# Patient Record
Sex: Female | Born: 1950
Health system: Southern US, Community
[De-identification: ages and names within clinical notes are randomized; demographics above are authoritative.]

## PROBLEM LIST (undated history)

## (undated) DIAGNOSIS — G4700 Insomnia, unspecified: Secondary | ICD-10-CM

## (undated) DIAGNOSIS — R5381 Other malaise: Secondary | ICD-10-CM

## (undated) DIAGNOSIS — E785 Hyperlipidemia, unspecified: Secondary | ICD-10-CM

## (undated) DIAGNOSIS — R5383 Other fatigue: Secondary | ICD-10-CM

## (undated) DIAGNOSIS — IMO0002 Reserved for concepts with insufficient information to code with codable children: Secondary | ICD-10-CM

## (undated) DIAGNOSIS — I471 Supraventricular tachycardia, unspecified: Secondary | ICD-10-CM

## (undated) DIAGNOSIS — F419 Anxiety disorder, unspecified: Secondary | ICD-10-CM

## (undated) DIAGNOSIS — K224 Dyskinesia of esophagus: Secondary | ICD-10-CM

## (undated) DIAGNOSIS — K219 Gastro-esophageal reflux disease without esophagitis: Secondary | ICD-10-CM

## (undated) DIAGNOSIS — D509 Iron deficiency anemia, unspecified: Secondary | ICD-10-CM

## (undated) DIAGNOSIS — Z8719 Personal history of other diseases of the digestive system: Secondary | ICD-10-CM

## (undated) DIAGNOSIS — IMO0001 Reserved for inherently not codable concepts without codable children: Secondary | ICD-10-CM

## (undated) DIAGNOSIS — C801 Malignant (primary) neoplasm, unspecified: Secondary | ICD-10-CM

## (undated) HISTORY — DX: Anxiety disorder, unspecified: F41.9

## (undated) HISTORY — DX: Other fatigue: R53.83

## (undated) HISTORY — DX: Supraventricular tachycardia: I47.1

## (undated) HISTORY — DX: Insomnia, unspecified: G47.00

## (undated) HISTORY — DX: Iron deficiency anemia, unspecified: D50.9

## (undated) HISTORY — DX: Reserved for inherently not codable concepts without codable children: IMO0001

## (undated) HISTORY — DX: Gastro-esophageal reflux disease without esophagitis: K21.9

## (undated) HISTORY — DX: Reserved for concepts with insufficient information to code with codable children: IMO0002

## (undated) HISTORY — DX: Dyskinesia of esophagus: K22.4

## (undated) HISTORY — DX: Other malaise: R53.81

## (undated) HISTORY — DX: Hyperlipidemia, unspecified: E78.5

## (undated) HISTORY — DX: Supraventricular tachycardia, unspecified: I47.10

## (undated) HISTORY — PX: CARPAL TUNNEL RELEASE: SHX101

---

## 1997-12-08 HISTORY — PX: CHOLECYSTECTOMY: SHX55

## 1998-05-19 ENCOUNTER — Emergency Department (HOSPITAL_COMMUNITY): Admission: EM | Admit: 1998-05-19 | Discharge: 1998-05-19 | Payer: Self-pay | Admitting: Emergency Medicine

## 1998-05-20 ENCOUNTER — Inpatient Hospital Stay (HOSPITAL_COMMUNITY): Admission: EM | Admit: 1998-05-20 | Discharge: 1998-05-24 | Payer: Self-pay | Admitting: Emergency Medicine

## 1998-05-20 ENCOUNTER — Ambulatory Visit (HOSPITAL_COMMUNITY): Admission: RE | Admit: 1998-05-20 | Discharge: 1998-05-20 | Payer: Self-pay | Admitting: Emergency Medicine

## 1999-12-12 ENCOUNTER — Ambulatory Visit (HOSPITAL_COMMUNITY): Admission: RE | Admit: 1999-12-12 | Discharge: 1999-12-12 | Payer: Self-pay | Admitting: Gastroenterology

## 1999-12-12 ENCOUNTER — Encounter: Payer: Self-pay | Admitting: Gastroenterology

## 2006-05-18 ENCOUNTER — Emergency Department (HOSPITAL_COMMUNITY): Admission: EM | Admit: 2006-05-18 | Discharge: 2006-05-18 | Payer: Self-pay | Admitting: Emergency Medicine

## 2006-06-02 ENCOUNTER — Ambulatory Visit: Payer: Self-pay | Admitting: Cardiovascular Disease

## 2006-06-22 ENCOUNTER — Ambulatory Visit: Payer: Self-pay

## 2006-06-22 ENCOUNTER — Encounter: Payer: Self-pay | Admitting: Cardiology

## 2006-06-22 ENCOUNTER — Ambulatory Visit: Payer: Self-pay | Admitting: Cardiovascular Disease

## 2009-03-21 ENCOUNTER — Emergency Department (HOSPITAL_COMMUNITY): Admission: EM | Admit: 2009-03-21 | Discharge: 2009-03-21 | Payer: Self-pay | Admitting: Emergency Medicine

## 2009-03-28 DIAGNOSIS — I498 Other specified cardiac arrhythmias: Secondary | ICD-10-CM | POA: Insufficient documentation

## 2009-03-28 DIAGNOSIS — E7800 Pure hypercholesterolemia, unspecified: Secondary | ICD-10-CM | POA: Insufficient documentation

## 2009-03-28 DIAGNOSIS — F411 Generalized anxiety disorder: Secondary | ICD-10-CM | POA: Insufficient documentation

## 2009-03-28 DIAGNOSIS — D649 Anemia, unspecified: Secondary | ICD-10-CM | POA: Insufficient documentation

## 2009-04-02 ENCOUNTER — Encounter: Payer: Self-pay | Admitting: Cardiovascular Disease

## 2009-04-02 ENCOUNTER — Ambulatory Visit: Payer: Self-pay | Admitting: Cardiovascular Disease

## 2009-05-08 ENCOUNTER — Encounter: Payer: Self-pay | Admitting: Internal Medicine

## 2009-05-08 ENCOUNTER — Ambulatory Visit: Payer: Self-pay | Admitting: Cardiovascular Disease

## 2009-05-09 ENCOUNTER — Telehealth: Payer: Self-pay | Admitting: Internal Medicine

## 2009-05-28 ENCOUNTER — Encounter: Admission: RE | Admit: 2009-05-28 | Discharge: 2009-05-28 | Payer: Self-pay | Admitting: Internal Medicine

## 2009-09-20 ENCOUNTER — Encounter (INDEPENDENT_AMBULATORY_CARE_PROVIDER_SITE_OTHER): Payer: Self-pay | Admitting: *Deleted

## 2011-01-09 NOTE — Letter (Signed)
Summary: Appointment - Reminder 2  [facility], Main Office  [address]   [place], [place] [address]   Phone: [phone]  Fax: [phone]     September 20, 2009 MRN: [id number]   [facility] [place] [address] [place], [place]  [address]   Dear Ms. Speakman,  Our records indicate that it is time to schedule a follow-up appointment with Dr. Graciela Husbands. It is very important that we reach you to schedule this appointment. We look forward to participating in your health care needs.   Please contact us at the number listed above at your earliest convenience to schedule your appointment.  If you are unable to make an appointment at this time, give [place] a call so we can update our records.  Sincerely,   Burnard Leigh [facility] Scheduling Team

## 2011-01-09 NOTE — Assessment & Plan Note (Signed)
Summary: nep/dx:SVT/lg   Primary Provider:  Johnella Arellano  CC:  SOB, palpitations, and racing feeling.  Dizziness.pt would like her Rx refilled for beta blocker.  History of Present Illness: Sheri Arellano has a three-year history of abrupt onset onset tachypalpitations  these occurred one to 3 times per year but more recently has occurred twice in the last 2 months. On the former occasion she ended up having to call EMS because of severity of symptoms. She was given some medication, presumably a [place], with termination.  She comments that the fire department was unable to record her blood pressure on arrival. At discharge from the emergency room she is given a prescription for metoprolol succinate 25 mg which he took daily until it ran out. This was coincidentally or not followed by another episode of tachycardia.  Symptoms with her episodes include weakness presyncope dyspnea and they are  "frog positive."  she uses some caffeine and admits to a great deal of psychosocial stress. She wonders whether this may be having any impact.   Current Medications (verified): 1)  Lorazepam 1 Mg Tabs (Lorazepam) .... As Needed 2)  Protonix 40 Mg Pack (Pantoprazole Sodium) .... Take 1 Tablet Once Daily 3)  Zoloft 100 Mg Tabs (Sertraline Hcl) .Marland Kitchen.. 1 Tab By Mouth Once Daily 4)  Zocor 80 Mg Tabs (Simvastatin) .... 1/2 Tab By Mouth Once Daily 5)  Femhrt .Marland Kitchen.. 1 Tab By Mouth Once Daily 6)  Calcium .Marland Kitchen.. 1 Tab By Mouth Once Daily 7)  Metolazone 10 Mg Tabs (Metolazone) .... Take One Tablet By Mouth Daily.-Patient Ran Out and Is Taking Expired Propranolol  Allergies (verified): No Known Drug Allergies  Past History:  Past medical, surgical, family and social histories (including risk factors) reviewed, and no changes noted (except as noted below). Past medical history reviewed for relevance to current acute and chronic problems.  Past Medical History: Reviewed history from 03/28/2009 and no changes  required. Current Problems:  HYPERCHOLESTEROLEMIA (ICD-272.0) ANXIETY (ICD-300.00) ANEMIA (ICD-285.9) SUPRAVENTRICULAR TACHYCARDIA (ICD-427.89) Episode in 2007 and 03/2009 requiring ER visit and adenosine  Family History: Reviewed history from 03/28/2009 and no changes required. noncontributory  Social History: Reviewed history from 04/02/2009 and no changes required. 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         She complains of rosacea, esophogeal spasm with reflux and globus.  She wears glasses  Vital Signs:  Patient profile:   60 year old female Height:      62 inches Weight:      167 pounds BMI:     30.66 Pulse rate:   67 / minute Pulse rhythm:   regular BP sitting:   110 / 70  (left arm) Cuff size:   regular  Vitals Entered By: Judithe Modest CMA (May 08, 2009 10:43 AM)  Physical Exam  General:  Alert and oriented in no acute distress. HEENT exam no xanthelasma and normocephalic. Neck veins were flat; carotids brisk and full without bruits. No lymphadenopathy. Back without kyphosis. Lungs clear. Heart sounds regular without murmurs or gallops. PMI nondisplaced. Abdomen soft with active bowel sounds without midline pulsation or hepatomegaly. Femoral pulses and distal pulses intact. Extremities were without clubbing cyanosis or edemaSkin warm and dry. Neurological exam grossly normal  Impression & Recommendations:  Problem # 1:  SUPRAVENTRICULAR TACHYCARDIA (ICD-427.89) Tpatient has recurrent tachypalpitations with electrocardiographic evidence of supraventricular tachycardia. This likely represents AV node reentry given her accompanying symptoms.  We discussed treatment options  including doing nothing, p.r.n. or daily beta blockers, antiarrhythmic drug therapy with its potential for poor arrhythmia, and EP testing with catheter ablation. We discussed the potential benefits as well as the potential risks including but not limited to death perforation heart block requiring pacemaker implantation and recurrence.  At this point she would like to continue on a beta blocker regime. We have refilled for her her Toprol 25 mg. She will take initially as needed in the event that she has another short interval recurrence she'll begin taking it daily.  We will anticipate. catheter ablation in  the event that the frequency become sufficiently problematic. Her updated medication list for this problem includes:    Metoprolol Succinate 25 Mg Xr24h-tab (Metoprolol succinate) .Marland Kitchen... Take one tablet by mouth daily as needed  Her updated medication list for this problem includes:    Metoprolol Succinate 25 Mg Xr24h-tab (Metoprolol succinate) .Marland Kitchen... Take one tablet by mouth daily  Problem # 2:  ANXIETY (ICD-300.00) She currently takes Zoloft. She asked me whether this was the best SSRI for anxiety. I told her I didn't know. I did tell her that we would be glad to try and find a contact for her  in the event that she would like to pursue anxiety related intervention.  Patient Instructions: 1)  Your physician has recommended you make the following change in your medication: add metoprolol succ er 25mg  daily- this has been sent to [facility] on [address]. 2)  Your physician recommends that you schedule a follow-up appointment in: 4 months.

## 2011-01-09 NOTE — Assessment & Plan Note (Signed)
Summary: [name]   CC:  sob with exertion  also pt states feels like some thing is crawling up her throat.  History of Present Illness: Sheri Arellano seen today at the request of [facility].  She was recently seen for recurrent PSVT.  He had a rhythm strip from April 14.  She was in a narrow complex tachycardia at a rate of about 170.  It broke with adenosine.  He said previous episode back in 2007 for which I saw her.  She's only had these 2 episodes that have required treatment.  Back and July of 07 she had a normal echocardiogram.  No history of structural heart disease.  Does get chest pressure at the time of PSVT.  This is likely related to her heart rate.  The pressure goes away when the PSVT breaks.  She was placed on a beta blocker I believe it was Toprol 25 b.i.d.  She tolerates this time.  She does not have a lot of stimulants.  He has minimal caffeine he drinks 2 glasses of vodka a week.  Her thyroid was normal.  She does have some stress.  Her husband had to close [employer] service station.  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  There is no history of coronary artery disease cardiomyopathy PND or orthopnea.  She works out at [employer] 3 days a week doing a lot of treadmill work.  No history of syncope long QT syndrome or arrhythmias outside of narrow complex tachycardia.  Current Medications (verified): 1)  Lorazepam 1 Mg Tabs (Lorazepam) .... As Needed 2)  Iron .Marland Kitchen.. 1 Tab  By Mouth Every Ther Day 3)  Protonix 40mg  .... 1 Tab By Mouth Once Daily 4)  Zoloft 100 Mg Tabs (Sertraline Hcl) .Marland Kitchen.. 1 Tab By Mouth Once Daily 5)  Zocor 80 Mg Tabs (Simvastatin) .... 1/2 Tab By Mouth Once Daily 6)  Femhrt .Marland Kitchen.. 1 Tab By Mouth Once Daily 7)  Calcium .Marland Kitchen.. 1 Tab By Mouth Once Daily 8)  Metolazone 10 Mg Tabs (Metolazone) .... Take One Tablet By Mouth Daily.  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   Vital Signs:  Patient profile:   60 year old female Height:      62 inches Weight:      163 pounds Pulse rate:   70 / minute Resp:     12 per minute BP sitting:   100 / 70  (left arm)  Vitals Entered By: Kem Parkinson (April 02, 2009 10:43 AM)  Physical Exam  General:  Affect appropriate Healthy:  appears stated age HEENT: normal Neck supple with no adenopathy JVP normal no bruits no thyromegaly Lungs clear with no wheezing and good diaphragmatic  motion Heart:  S1/S2 no murmur,rub, gallop or click PMI normal Abdomen: benighn, BS positve, no tenderness, no AAA no bruit.  No HSM or HJR Distal pulses intact with no bruits No edema Neuro non-focal Skin warm and dry    Impression & Recommendations:  Problem # 1:  SUPRAVENTRICULAR TACHYCARDIA (ICD-427.89) Continue BB until seen by EPS consider ablation but likely conservative therapy for now The following medications were removed from the medication list:    Aspirin 81 Mg Tabs (Aspirin)  Orders: Misc. Referral (Misc. Ref)  Problem # 2:  HYPERCHOLESTEROLEMIA (ICD-272.0) Needs F/U lipid and liver with primary this month Her updated medication list for this problem includes:    Zocor 80 Mg Tabs (Simvastatin) .Marland Kitchen... 1/2 tab by mouth once daily  Problem # 3:  ANXIETY (ICD-300.00) Continue zoloft while job issues  are difficult  Patient Instructions: 1)  EPS referral 2)  Continue BB til then

## 2011-03-19 LAB — URINALYSIS, ROUTINE W REFLEX MICROSCOPIC
Bilirubin Urine: NEGATIVE
Glucose, UA: NEGATIVE mg/dL
Hgb urine dipstick: NEGATIVE
Ketones, ur: NEGATIVE mg/dL
Nitrite: NEGATIVE
Protein, ur: NEGATIVE mg/dL
Specific Gravity, Urine: 1.011 (ref 1.005–1.030)
Urobilinogen, UA: 0.2 mg/dL (ref 0.0–1.0)
pH: 7.5 (ref 5.0–8.0)

## 2011-03-19 LAB — CBC
HCT: 43.1 % (ref 36.0–46.0)
Hemoglobin: 15.1 g/dL — ABNORMAL HIGH (ref 12.0–15.0)
MCHC: 35 g/dL (ref 30.0–36.0)
MCV: 94.1 fL (ref 78.0–100.0)
Platelets: 250 10*3/uL (ref 150–400)
RBC: 4.58 MIL/uL (ref 3.87–5.11)
RDW: 13.3 % (ref 11.5–15.5)
WBC: 6.5 10*3/uL (ref 4.0–10.5)

## 2011-03-19 LAB — DIFFERENTIAL
Basophils Absolute: 0 10*3/uL (ref 0.0–0.1)
Basophils Relative: 0 % (ref 0–1)
Eosinophils Absolute: 0 10*3/uL (ref 0.0–0.7)
Eosinophils Relative: 1 % (ref 0–5)
Lymphocytes Relative: 25 % (ref 12–46)
Lymphs Abs: 1.6 10*3/uL (ref 0.7–4.0)
Monocytes Absolute: 0.4 10*3/uL (ref 0.1–1.0)
Monocytes Relative: 6 % (ref 3–12)
Neutro Abs: 4.4 10*3/uL (ref 1.7–7.7)
Neutrophils Relative %: 67 % (ref 43–77)

## 2011-03-19 LAB — CK TOTAL AND CKMB (NOT AT ARMC)
CK, MB: 0.9 ng/mL (ref 0.3–4.0)
Relative Index: INVALID (ref 0.0–2.5)
Total CK: 50 U/L (ref 7–177)

## 2011-03-19 LAB — BASIC METABOLIC PANEL
BUN: 9 mg/dL (ref 6–23)
CO2: 27 mEq/L (ref 19–32)
Calcium: 8.8 mg/dL (ref 8.4–10.5)
Chloride: 104 mEq/L (ref 96–112)
Creatinine, Ser: 0.75 mg/dL (ref 0.4–1.2)
GFR calc Af Amer: >60 mL/min (ref >60)
GFR calc non Af Amer: >60 mL/min (ref >60)
Glucose, Bld: 110 mg/dL — ABNORMAL HIGH (ref 70–99)
Potassium: 3.3 mEq/L — ABNORMAL LOW (ref 3.5–5.1)
Sodium: 137 mEq/L (ref 135–145)

## 2011-03-19 LAB — POCT CARDIAC MARKERS
CKMB, poc: <1 ng/mL — ABNORMAL LOW (ref 1.0–8.0)
Myoglobin, poc: 47.1 ng/mL (ref 12–200)
Troponin i, poc: <0.05 ng/mL (ref 0.00–0.09)

## 2011-03-19 LAB — TROPONIN I: Troponin I: 0.01 ng/mL (ref 0.00–0.06)

## 2012-01-06 ENCOUNTER — Other Ambulatory Visit (HOSPITAL_COMMUNITY)
Admission: RE | Admit: 2012-01-06 | Discharge: 2012-01-06 | Disposition: A | Discharge status: Home / Self Care | Payer: BC Managed Care – PPO | Source: Ambulatory Visit | Attending: Internal Medicine | Admitting: Internal Medicine

## 2012-01-06 DIAGNOSIS — Z1159 Encounter for screening for other viral diseases: Secondary | ICD-10-CM | POA: Insufficient documentation

## 2012-01-06 DIAGNOSIS — Z01419 Encounter for gynecological examination (general) (routine) without abnormal findings: Secondary | ICD-10-CM | POA: Insufficient documentation

## 2012-01-15 ENCOUNTER — Encounter (INDEPENDENT_AMBULATORY_CARE_PROVIDER_SITE_OTHER): Payer: Self-pay | Admitting: Surgery

## 2012-01-21 ENCOUNTER — Encounter (INDEPENDENT_AMBULATORY_CARE_PROVIDER_SITE_OTHER): Payer: Self-pay | Admitting: Surgery

## 2012-02-16 ENCOUNTER — Encounter (INDEPENDENT_AMBULATORY_CARE_PROVIDER_SITE_OTHER): Payer: Self-pay | Admitting: Surgery

## 2012-02-16 ENCOUNTER — Ambulatory Visit (INDEPENDENT_AMBULATORY_CARE_PROVIDER_SITE_OTHER): Payer: BC Managed Care – PPO | Admitting: Surgery

## 2012-02-16 VITALS — BP 122/70 | HR 72 | Temp 98.2°F | Ht 62.5 in | Wt 152.2 lb

## 2012-02-16 DIAGNOSIS — K623 Rectal prolapse: Secondary | ICD-10-CM | POA: Insufficient documentation

## 2012-02-16 NOTE — Patient Instructions (Signed)
Prolapse  Prolapse means the falling down, bulging, dropping, or drooping of a body part. Organs that commonly prolapse include the rectum, small intestine, bladder, urethra, vagina (birth canal), uterus (womb), and cervix. Prolapse occurs when the ligaments and muscle tissue around the rectum, bladder, and uterus are damaged or weakened.  CAUSES  This happens especially with:  Childbirth. Some women feel pelvic pressure or have trouble holding their urine right after childbirth, because of stretching and tearing of pelvic tissues. This generally gets better with time and the feeling usually goes away, but it may return with aging.   Chronic heavy lifting.   Aging.   Menopause, with loss of estrogen production weakening the pelvic ligaments and muscles.   Past pelvic surgery.   Obesity.   Chronic constipation.   Chronic cough.  Prolapse may affect a single organ, or several organs may prolapse at the same time. The front wall of the vagina holds up the bladder. The back wall holds up part of the lower intestine, or rectum. The uterus fills a spot in the middle. All these organs can be involved when the ligaments and muscles around the vagina relax too much. This often gets worse when women stop producing estrogen (menopause). SYMPTOMS  Uncontrolled loss of urine (incontinence) with cough, sneeze, straining, and exercise.   More force may be required to have a bowel movement, due to trapping of the stool.   When part of an organ bulges through the opening of the vagina, there is sometimes a feeling of heaviness or pressure. It may feel as though something is falling out. This sensation increases with coughing or bearing down.   If the organs protrude through the opening of the vagina and rub against the clothing, there may be soreness, ulcers, infection, pain, and bleeding.   Lower back pain.   Pushing in the upper or lower part of the vagina, to pass urine or have a bowel movement.     Problems having sexual intercourse.   Being unable to insert a tampon or applicator.  DIAGNOSIS  Usually, a physical exam is all that is needed to identify the problem. During the examination, you may be asked to cough and strain while lying down, sitting up, and standing up. Your caregiver will determine if more testing is required, such as bladder function tests. Some diagnoses are:  Cystocele: Bulging and falling of the bladder into the top of the vagina.   Rectocele: Part of the rectum bulging into the vagina.   Prolapse of the uterus: The uterus falls or drops into the vagina.   Enterocele: Bulging of the top of the vagina, after a hysterectomy (uterus removal), with the small intestine bulging into the vagina. A hernia in the top of the vagina.   Urethrocele: The urethra (urine carrying tube) bulging into the vagina.  TREATMENT  In most cases, prolapse needs to be treated only if it produces symptoms. If the symptoms are interfering with your usual daily or sexual activities, treatment may be necessary. The following are some measures that may be used to treat prolapse.  Estrogen may help elderly women with mild prolapse.   Kegel exercises may help mild cases of prolapse, by strengthening and tightening the muscles of the pelvic floor.   Pessaries are used in women who choose not to, or are unable to, have surgery. A pessary is a doughnut-shaped piece of plastic or rubber that is put into the vagina to keep the organs in place. This device must   be fitted by your caregiver. Your caregiver will also explain how to care for yourself with the pessary. If it works well for you, this may be the only treatment required.   Surgery is often the only form of treatment for more severe prolapses. There are different types of surgery available. You should discuss what the best procedure is for you. If the uterus is prolapsed, it may be removed (hysterectomy) as part of the surgical treatment.  Your caregiver will discuss the risks and benefits with you.   Uterine-vaginal suspension (surgery to hold up the organs) may be used, especially if you want to maintain your fertility.  No form of treatment is guaranteed to correct the prolapse or relieve the symptoms. HOME CARE INSTRUCTIONS   Wear a sanitary pad or absorbent product if you have incontinence of urine.   Avoid heavy lifting and straining with exercise and work.   Take over-the-counter pain medicine for minor discomfort.   Try taking estrogen or using estrogen vaginal cream.   Try Kegel exercises or use a pessary, before deciding to have surgery.   Do Kegel exercises after having a baby.  SEEK MEDICAL CARE IF:   Your symptoms interfere with your daily activities.   You need medicine to help with the discomfort.   You need to be fitted with a pessary.   You notice bleeding from the vagina.   You think you have ulcers or you notice ulcers on the cervix.   You have an oral temperature above 102 F (38.9 C).   You develop pain or blood with urination.   You have bleeding with a bowel movement.   The symptoms are interfering with your sex life.   You have urinary incontinence that interferes with your daily activities.   You lose urine with sexual intercourse.   You have a chronic cough.   You have chronic constipation.  Document Released: 05/31/2003 Document Revised: 11/13/2011 Document Reviewed: 12/09/2009 Exit[employer] Patient Information 2012 [employer]. 

## 2012-02-16 NOTE — Progress Notes (Signed)
Chief Complaint  Patient presents with  . Rectal Problems    eval of skin tags - referral from Dr. Robley Fries, [place] [facility]    HISTORY: Patient is a pleasant 61 year old white female referred by her primary care physician for evaluation of anal skin tags. Patient states that these have been present since the birth of her 2 children. She denies any complaints related to hemorrhoid disease. She has had no pain. She denies any rectal bleeding. She has had no prior rectal surgery. Patient notes difficulty with cleaning after bowel movements. She notes soilage one to 2 hours following bowel movement. She denies any pain.  Past Medical History  Diagnosis Date  . Esophageal dysmotility   . Anxiety     probable panic attacks  . Insomnia   . Iron deficiency anemia   . GERD (gastroesophageal reflux disease)   . Hyperlipidemia   . Palpitations   . Dysphagia   . Vitamin d deficiency   . Tachycardia, unspecified   . Other malaise and fatigue      Current Outpatient Prescriptions  Medication Sig Dispense Refill  . clonazePAM (KLONOPIN) 1 MG tablet Take 1 mg by mouth 2 (two) times daily as needed.      Marland Kitchen estradiol (ESTRACE) 1 MG tablet Take 1 mg by mouth daily.      . Fiber-Vitamins-Minerals (FIBERALL PO) Take by mouth.      . metoprolol tartrate (LOPRESSOR) 25 MG tablet Take 25 mg by mouth 2 (two) times daily.      . nitroGLYCERIN (NITROSTAT) 0.4 MG SL tablet Place 0.4 mg under the tongue every 5 (five) minutes as needed.      . pantoprazole (PROTONIX) 40 MG tablet Take 40 mg by mouth daily.      . sertraline (ZOLOFT) 100 MG tablet Take 100 mg by mouth daily.      . simvastatin (ZOCOR) 40 MG tablet Take 40 mg by mouth every evening.         Allergies  Allergen Reactions  . Erythromycin Base      Family History  Problem Relation Age of Onset  . Atrial fibrillation Mother   . Hyperlipidemia Mother   . Hypertension Mother   . Cancer Father     bladder  . Cancer Maternal  Aunt     breast     History   Social History  . Marital Status: Married    Spouse Name: N/A    Number of Children: N/A  . Years of Education: N/A   Social History Main Topics  . Smoking status: Former Games developer  . Smokeless tobacco: Former Neurosurgeon    Quit date: 02/16/1975  . Alcohol Use: 0.6 oz/week    1 Glasses of wine per week  . Drug Use: No  . Sexually Active:    Other Topics Concern  . None   Social History Narrative  . None     REVIEW OF SYSTEMS - PERTINENT POSITIVES ONLY: No symptoms of internal or external hemorrhoids. Small fecal incontinence. Anal skin tags.  EXAM: Filed Vitals:   02/16/12 1329  BP: 122/70  Pulse: 72  Temp: 98.2 F (36.8 C)    HEENT: normocephalic; pupils equal and reactive; sclerae clear; dentition good; mucous membranes moist NECK:  symmetric on extension; no palpable anterior or posterior cervical lymphadenopathy; no supraclavicular masses; no tenderness CHEST: clear to auscultation bilaterally without rales, rhonchi, or wheezes CARDIAC: regular rate and rhythm without significant murmur; peripheral pulses are full GU:  External  exam shows circumferential skin tags. These were broad-based. On closer inspection there is actually prolapse of the rectal mucosa through the anal orifice resulting in what appears to be significant skin tags circumferentially. With slight eversion the rectal mucosa is visible above the level of the dentate line. Digital rectal exam shows relatively normal tone. No palpable masses. EXT:  non-tender without edema; no deformity NEURO: no gross focal deficits; no sign of tremor   LABORATORY RESULTS: See [facility] (CHL-Epic) for most recent results   RADIOLOGY RESULTS: See [facility] (CHL-Epic) for most recent results   IMPRESSION: #1 mild rectal prolapse #2 anal skin tags  PLAN: I discussed these findings at length with the patient. I do not think that simply excising anal skin tags will provide  her with the results which she needs. I believe she has an element of rectal prolapse contributing to her episodes of fecal incontinence and drainage. I have discussed her case with my partner, Dr. Claud Kelp, who has agreed to see her in consultation.  I believe she may be a candidate for the [facility] procedure. We will make arrangements for this evaluation in the near future.  Velora Heckler, MD, FACS General & Endocrine Surgery [facility]   Visit Diagnoses: 1. Rectal mucosa prolapse, anal skin tags     Primary Care Physician: Pearla Dubonnet, MD, MD

## 2012-03-15 ENCOUNTER — Ambulatory Visit (INDEPENDENT_AMBULATORY_CARE_PROVIDER_SITE_OTHER): Payer: BC Managed Care – PPO | Admitting: General Surgery

## 2012-03-15 ENCOUNTER — Encounter (INDEPENDENT_AMBULATORY_CARE_PROVIDER_SITE_OTHER): Payer: Self-pay | Admitting: General Surgery

## 2012-03-15 VITALS — BP 106/72 | HR 68 | Temp 99.8°F | Resp 12 | Ht 62.5 in | Wt 150.6 lb

## 2012-03-15 DIAGNOSIS — K644 Residual hemorrhoidal skin tags: Secondary | ICD-10-CM

## 2012-03-15 DIAGNOSIS — K648 Other hemorrhoids: Secondary | ICD-10-CM

## 2012-03-15 NOTE — Progress Notes (Signed)
Patient ID: Sheri Arellano, female   DOB: 28-Nov-1951, 61 y.o.   MRN: [id number]  Chief Complaint  Patient presents with  . Other    new pt- eval for possible PPH    HPI Sheri Arellano is a 61 y.o. female.  This patient was referred to me by Dr. Darnell Level because of symptomatic hemorrhoids. Her primary care physician is Dr. Jonah Blue.  The patient's history is one of hemorrhoids for 30 years. Her hemorrhoids are extremely large externally. She rarely has bleeding. She has no pain. They are so large that she cannot get them clean and it is  very difficult to maintain skin hygiene. She has thought about having something done for some time and is now ready to do that.  She otherwise is healthy. She occasionally has palpitations and takes Lopressor if that happens but this is rare. She is a little bit of reflux and takes proton next. She takes Zoloft, Clonopin her to help her sleep and she takes Zocor for hyperlipidemia. She's had no major medical problems. HPI  Past Medical History  Diagnosis Date  . Esophageal dysmotility   . Anxiety     probable panic attacks  . Insomnia   . Iron deficiency anemia   . GERD (gastroesophageal reflux disease)   . Hyperlipidemia   . Palpitations   . Dysphagia   . Vitamin d deficiency   . Tachycardia, unspecified   . Other malaise and fatigue     Past Surgical History  Procedure Date  . Carpal tunnel release   . Cholecystectomy 1999    Family History  Problem Relation Age of Onset  . Atrial fibrillation Mother   . Hyperlipidemia Mother   . Hypertension Mother   . Cancer Father     bladder  . Cancer Maternal Aunt     breast    Social History History  Substance Use Topics  . Smoking status: Former Games developer  . Smokeless tobacco: Former Neurosurgeon    Quit date: 02/16/1975  . Alcohol Use: 0.6 oz/week    1 Glasses of wine per week    Allergies  Allergen Reactions  . Erythromycin Base     Current Outpatient Prescriptions  Medication  Sig Dispense Refill  . clonazePAM (KLONOPIN) 1 MG tablet Take 1 mg by mouth 2 (two) times daily as needed.      Marland Kitchen estradiol (ESTRACE) 1 MG tablet Take 1 mg by mouth daily.      . Fiber-Vitamins-Minerals (FIBERALL PO) Take by mouth.      . metoprolol tartrate (LOPRESSOR) 25 MG tablet Take 25 mg by mouth 2 (two) times daily.      . nitroGLYCERIN (NITROSTAT) 0.4 MG SL tablet Place 0.4 mg under the tongue every 5 (five) minutes as needed.      . pantoprazole (PROTONIX) 40 MG tablet Take 40 mg by mouth daily.      . sertraline (ZOLOFT) 100 MG tablet Take 100 mg by mouth daily.      . simvastatin (ZOCOR) 40 MG tablet Take 40 mg by mouth every evening.        Review of Systems Review of Systems  Constitutional: Negative for fever, chills and unexpected weight change.  HENT: Negative for hearing loss, congestion, sore throat, trouble swallowing and voice change.   Eyes: Negative for visual disturbance.  Respiratory: Negative for cough and wheezing.   Cardiovascular: Positive for palpitations. Negative for chest pain and leg swelling.  Gastrointestinal: Negative for nausea, vomiting, abdominal  pain, diarrhea, constipation, blood in stool, abdominal distention and anal bleeding.  Genitourinary: Negative for hematuria, vaginal bleeding and difficulty urinating.  Musculoskeletal: Negative for arthralgias.  Skin: Negative for rash and wound.  Neurological: Negative for seizures, syncope and headaches.  Hematological: Negative for adenopathy. Does not bruise/bleed easily.  Psychiatric/Behavioral: Negative for confusion.    Blood pressure 106/72, pulse 68, temperature 99.8 F (37.7 C), temperature source Temporal, resp. rate 12, height 5' 2.5" (1.588 m), weight 150 lb 9.6 oz (68.312 kg).  Physical Exam Physical Exam  Constitutional: She is oriented to person, place, and time. She appears well-developed and well-nourished. No distress.  HENT:  Head: Normocephalic and atraumatic.  Nose: Nose  normal.  Mouth/Throat: No oropharyngeal exudate.  Eyes: Conjunctivae and EOM are normal. Pupils are equal, round, and reactive to light. Left eye exhibits no discharge. No scleral icterus.  Neck: Neck supple. No JVD present. No tracheal deviation present. No thyromegaly present.  Cardiovascular: Normal rate, regular rhythm, normal heart sounds and intact distal pulses.   No murmur heard. Pulmonary/Chest: Effort normal and breath sounds normal. No respiratory distress. She has no wheezes. She has no rales. She exhibits no tenderness.  Abdominal: Soft. Bowel sounds are normal. She exhibits no distension and no mass. There is no tenderness. There is no rebound and no guarding.  Genitourinary:       External hemorrhoids are circumferential and are huge. They are not thrombosed. They are not acutely inflamed. There are also internal hemorrhoids with his some minor prolapse. Anoscopy revealed internal hemorrhoids but they are not that large.  Musculoskeletal: She exhibits no edema and no tenderness.  Lymphadenopathy:    She has no cervical adenopathy.  Neurological: She is alert and oriented to person, place, and time. She exhibits normal muscle tone. Coordination normal.  Skin: Skin is warm. No rash noted. She is not diaphoretic. No erythema. No pallor.  Psychiatric: She has a normal mood and affect. Her behavior is normal. Judgment and thought content normal.    Data Reviewed  I reviewed Dr. Sid Falcon office notes. Assessment    External hemorrhoids with complications of bleeding. These are very large volume hemorrhoids and I do not take that a stapled hemorrhoid a pexy will alleviate her symptoms.  Internal hemorrhoids with prolapse, stage II  Intermittent unspecified tachycardia and palpitations  Hyperlipidemia  Gastroesophageal reflux disease  Esophageal dysmotility    Plan    I had a long talk with this very pleasant patient. I told her that because of the huge volume of her  external hemorrhoidal component, that her symptoms would not be relieved simply by stapled hemorrhoidopexy. In addition, bleeding and prolapse are not her primary complaints.  I advised her to consider surgical internal and external hemorrhoidectomy, all 3 columns. I explained the techniques of the surgery. I explained the postop disability and discomfort and the expectation of ultimate recovery and relief of symptoms with about a 90% chance. I talked about the risks of bleeding, infection, stenosis, sphincter damage with incontinence, residual tags requiring further surgery later, and other unforseen complications. . She understands these issues. Her questions were answered.  She will to go home and think about this. She seemed interested in surgery. She will call me when she is ready.       Angelia Mould. Derrell Lolling, M.D., [facility] General and Minimally invasive Surgery Breast and Colorectal Surgery Office:   [phone] Pager:   [phone]  03/15/2012, 4:24 PM

## 2012-03-15 NOTE — Patient Instructions (Signed)
You have  very large external hemorrhoids and smaller but somewhat prolapsing internal hemorrhoids. This will require a surgical hemorrhoidectomy with sutures to control the problem. I do not think that a stapled hemorrhoidopexy will control your symptoms.  Please call back when you are ready to schedule the surgery.

## 2012-05-21 DIAGNOSIS — Z9109 Other allergy status, other than to drugs and biological substances: Secondary | ICD-10-CM | POA: Insufficient documentation

## 2012-11-17 ENCOUNTER — Encounter (INDEPENDENT_AMBULATORY_CARE_PROVIDER_SITE_OTHER): Payer: BC Managed Care – PPO | Admitting: Cardiovascular Disease

## 2012-11-17 DIAGNOSIS — R0989 Other specified symptoms and signs involving the circulatory and respiratory systems: Secondary | ICD-10-CM

## 2012-11-18 ENCOUNTER — Ambulatory Visit (INDEPENDENT_AMBULATORY_CARE_PROVIDER_SITE_OTHER): Payer: BC Managed Care – PPO | Admitting: Cardiovascular Disease

## 2012-11-18 ENCOUNTER — Encounter: Payer: Self-pay | Admitting: Cardiovascular Disease

## 2012-11-18 VITALS — BP 121/77 | HR 81 | Ht 62.5 in | Wt 155.6 lb

## 2012-11-18 DIAGNOSIS — I498 Other specified cardiac arrhythmias: Secondary | ICD-10-CM

## 2012-11-18 DIAGNOSIS — E7800 Pure hypercholesterolemia, unspecified: Secondary | ICD-10-CM

## 2012-11-18 DIAGNOSIS — R079 Chest pain, unspecified: Secondary | ICD-10-CM

## 2012-11-18 DIAGNOSIS — F411 Generalized anxiety disorder: Secondary | ICD-10-CM

## 2012-11-18 DIAGNOSIS — R002 Palpitations: Secondary | ICD-10-CM

## 2012-11-18 MED ORDER — METOPROLOL TARTRATE 25 MG PO TABS: 25.0000 mg | ORAL_TABLET | Freq: Two times a day (BID) | ORAL | Status: DC | PRN

## 2012-11-18 NOTE — Progress Notes (Signed)
Patient ID: Sheri Arellano, female   DOB: 06-30-1951, 61 y.o.   MRN: [id number] 61 y.o referred by Dr Kevan Ny for palpitatoins, ? Recurrent SVT and chest pain.  She has anxiety.  Seen by Dr Graciela Husbands in 2/12 and did not want ablation.  Has had 3-4 episodes over the last 2 years of SVT.  Initial episode Rx by EMS with adenosine with resolution. Dr Graciela Husbands indicated metroprolol Rx.  Patient had not been on it but had 30 minute episode HR over 200 on 11/26 that broke when EMS arrived.  She took the metoprolol for a few days around [place] Had another episode a week latter wit nausea, diaphoresis and presyncope but not the rapid beating Had SSCP and dyspnea with SVT.  In office today had PVC.  Gets occasional flip flops weekly.   ROS: Denies fever, malais, weight loss, blurry vision, decreased visual acuity, cough, sputum, SOB, hemoptysis, pleuritic pain, palpitaitons, heartburn, abdominal pain, melena, lower extremity edema, claudication, or rash.  All other systems reviewed and negative   General: Affect appropriate Healthy:  appears stated age HEENT: normal Neck supple with no adenopathy JVP normal no bruits no thyromegaly Lungs clear with no wheezing and good diaphragmatic motion Heart:  S1/S2 no murmur,rub, gallop or click PMI normal Abdomen: benighn, BS positve, no tenderness, no AAA no bruit.  No HSM or HJR Distal pulses intact with no bruits No edema Neuro non-focal Skin warm and dry No muscular weakness  Medications Current Outpatient Prescriptions  Medication Sig Dispense Refill  . clonazePAM (KLONOPIN) 1 MG tablet Take 1 mg by mouth 2 (two) times daily as needed.      Marland Kitchen estradiol (ESTRACE) 1 MG tablet Take 1 mg by mouth daily.      . Fiber-Vitamins-Minerals (FIBERALL PO) Take by mouth.      . metoprolol tartrate (LOPRESSOR) 25 MG tablet Take 25 mg by mouth 2 (two) times daily as needed.       . nitroGLYCERIN (NITROSTAT) 0.4 MG SL tablet Place 0.4 mg under the tongue every 5 (five)  minutes as needed.      . pantoprazole (PROTONIX) 40 MG tablet Take 40 mg by mouth daily.      . sertraline (ZOLOFT) 100 MG tablet Take 100 mg by mouth daily.      . simvastatin (ZOCOR) 40 MG tablet Take 40 mg by mouth every evening.        Allergies Erythromycin base  Family History: Family History  Problem Relation Age of Onset  . Atrial fibrillation Mother   . Hyperlipidemia Mother   . Hypertension Mother   . Cancer Father     bladder  . Cancer Maternal Aunt     breast    Social History: History   Social History  . Marital Status: Married    Spouse Name: N/A    Number of Children: N/A  . Years of Education: N/A   Occupational History  . Not on file.   Social History Main Topics  . Smoking status: Former Games developer  . Smokeless tobacco: Former Neurosurgeon    Quit date: 02/16/1975  . Alcohol Use: 0.6 oz/week    1 Glasses of wine per week  . Drug Use: No  . Sexually Active:    Other Topics Concern  . Not on file   Social History Narrative  . No narrative on file    Electrocardiogram:  SR rate 81  PVC  R wave progression improved since 6/10  Assessment and Plan

## 2012-11-18 NOTE — Addendum Note (Signed)
Addended by: Scherrie Bateman E on: 11/18/2012 11:21 AM   Modules accepted: Orders

## 2012-11-18 NOTE — Assessment & Plan Note (Signed)
Continue PRN BZ;s

## 2012-11-18 NOTE — Patient Instructions (Signed)
Your physician recommends that you schedule a follow-up appointment in: WITH DR Graciela Husbands  TO DISCUSS SVT ABLATION Your physician recommends that you continue on your current medications as directed. Please refer to the Current Medication list given to you today.  Your physician has requested that you have a stress echocardiogram. For further information please visit [email or web address] Please follow instruction sheet as given. DX CHEST PAIN

## 2012-11-18 NOTE — Assessment & Plan Note (Signed)
Continue PRN beta blocker Refer to [facility] for reconsideration of ablation for presumed AVNRT.  Since she has had some chest pain and PVC in office will do stress echo to R/O exercise induced arrhythmia and assess EF

## 2012-11-18 NOTE — Assessment & Plan Note (Signed)
Cholesterol is at goal.  Continue current dose of statin and diet Rx.  No myalgias or side effects.  F/U  LFT's in 6 months. No results found for this basename: LDLCALC   Labs with Dr Gates           

## 2012-12-10 ENCOUNTER — Ambulatory Visit (HOSPITAL_COMMUNITY): Payer: BC Managed Care – PPO | Attending: Cardiology | Admitting: Radiology

## 2012-12-10 ENCOUNTER — Ambulatory Visit (HOSPITAL_COMMUNITY): Payer: BC Managed Care – PPO | Attending: Cardiology

## 2012-12-10 ENCOUNTER — Encounter: Payer: Self-pay | Admitting: Cardiovascular Disease

## 2012-12-10 DIAGNOSIS — R079 Chest pain, unspecified: Secondary | ICD-10-CM

## 2012-12-10 DIAGNOSIS — R072 Precordial pain: Secondary | ICD-10-CM | POA: Insufficient documentation

## 2012-12-10 DIAGNOSIS — R0989 Other specified symptoms and signs involving the circulatory and respiratory systems: Secondary | ICD-10-CM

## 2012-12-10 DIAGNOSIS — R002 Palpitations: Secondary | ICD-10-CM | POA: Insufficient documentation

## 2012-12-10 NOTE — Progress Notes (Signed)
Stress Echocardiogram performed.  

## 2012-12-30 ENCOUNTER — Encounter: Payer: BC Managed Care – PPO | Admitting: Internal Medicine

## 2013-01-10 ENCOUNTER — Ambulatory Visit (INDEPENDENT_AMBULATORY_CARE_PROVIDER_SITE_OTHER): Payer: BC Managed Care – PPO | Admitting: Internal Medicine

## 2013-01-10 ENCOUNTER — Encounter: Payer: Self-pay | Admitting: Internal Medicine

## 2013-01-10 VITALS — BP 113/87 | HR 76 | Ht 62.5 in | Wt 155.8 lb

## 2013-01-10 DIAGNOSIS — I498 Other specified cardiac arrhythmias: Secondary | ICD-10-CM

## 2013-01-10 NOTE — Assessment & Plan Note (Signed)
The patient has documented adenosine sensitive SVT dating back into her 36s with symptoms consistent with AV reentry. At this juncture, having discussed beta blockers and catheter ablation she would like to continue with when necessary medications. We reviewed vagal maneuvers. We reviewed her stress tests. We will see her in one year.

## 2013-01-10 NOTE — Patient Instructions (Addendum)
Your physician wants you to follow-up in: 1 year.   You will receive a reminder letter in the mail two months in advance. If you don't receive a letter, please call our office to schedule the follow-up appointment.  We will cancel your appt with Dr Eden Emms

## 2013-01-10 NOTE — Progress Notes (Signed)
ELECTROPHYSIOLOGY CONSULT NOTE  Patient ID: Sheri Arellano, MRN: [id number], DOB/AGE: June 10, 1951 62 y.o. Admit date: (Not on file) Date of Consult: 01/10/2013  Primary Physician: Sheri Dubonnet, MD Primary Cardiologist:  PNishAn Chief Complaint: SVT   HPI Sheri Arellano is a 62 y.o. female  Seen after a hiatus of 3-1/2 years because of SVT with documented tachycardia and symptoms suggestive of AV  reentry . At that time she elected drug therapy with a beta blocker  She has had 3 or 4 episodes over the last couple of years. She had one episode in [place] to [place]  EMS called after her beta blockers did not work; Valsalva maneuvers were successful and she went on her way to [place].  Her symptoms are notable primarily for tachypalpitations and nausea. They are frog negative. They prompt a need to defecate but not to urinate.  Apart from these episodes she has no exercise intolerance. A stress echo was undertaken which was normal.an echocardiogram 2007 demonstrated normal valvular and ventricular function.    Past Medical History  Diagnosis Date  . Esophageal dysmotility   . Anxiety     probable panic attacks  . Insomnia   . Iron deficiency anemia   . GERD (gastroesophageal reflux disease)   . Hyperlipidemia   . SVT (supraventricular tachycardia)     documented  . Dysphagia   . Vitamin D deficiency   . Other malaise and fatigue       Surgical History:  Past Surgical History  Procedure Date  . Carpal tunnel release   . Cholecystectomy 1999     Home Meds: Prior to Admission medications   Medication Sig Start Date End Date Taking? Authorizing Provider  clonazePAM (KLONOPIN) 1 MG tablet Take 1 mg by mouth at bedtime as needed.    Yes Historical Provider, MD  estradiol (ESTRACE) 1 MG tablet Take 1 mg by mouth daily.   Yes Historical Provider, MD  Fiber-Vitamins-Minerals (FIBERALL PO) Take by mouth.   Yes Historical Provider, MD  metoprolol tartrate (LOPRESSOR) 25 MG  tablet Take 1 tablet (25 mg total) by mouth 2 (two) times daily as needed. 11/18/12  Yes Wendall Stade, MD  nitroGLYCERIN (NITROSTAT) 0.4 MG SL tablet Place 0.4 mg under the tongue every 5 (five) minutes as needed.   Yes Historical Provider, MD  pantoprazole (PROTONIX) 40 MG tablet Take 40 mg by mouth daily.   Yes Historical Provider, MD  sertraline (ZOLOFT) 100 MG tablet Take 100 mg by mouth daily. Takes 1/2 daily   Yes Historical Provider, MD  simvastatin (ZOCOR) 40 MG tablet Take 40 mg by mouth every evening. Takes 1/2 daily   Yes Historical Provider, MD      Allergies:  Allergies  Allergen Reactions  . Erythromycin Base     History   Social History  . Marital Status: Married    Spouse Name: N/A    Number of Children: N/A  . Years of Education: N/A   Occupational History  . Not on file.   Social History Main Topics  . Smoking status: Former Games developer  . Smokeless tobacco: Former Neurosurgeon    Quit date: 02/16/1975  . Alcohol Use: 0.6 oz/week    1 Glasses of wine per week  . Drug Use: No  . Sexually Active:    Other Topics Concern  . Not on file   Social History Narrative  . No narrative on file     Family History  Problem Relation  Age of Onset  . Atrial fibrillation Mother   . Hyperlipidemia Mother   . Hypertension Mother   . Cancer Father     bladder  . Cancer Maternal Aunt     breast     ROS:  Please see the history of present illness.   Negative except  esophageal spasm and anxiety  All other systems reviewed and negative.    Physical Exam:he is daughter today Blood pressure 113/87, pulse 76, height 5' 2.5" (1.588 m), weight 155 lb 12.8 oz (70.67 kg). General: Well developed, well nourished female in no acute distress. Head: Normocephalic, atraumatic, sclera non-icteric, no xanthomas, nares are without discharge. Lymph Nodes:  none Back: without scoliosis/kyphosis, no CVA tendersness Neck: Negative for carotid bruits. JVD not elevated. Lungs: Clear  bilaterally to auscultation without wheezes, rales, or rhonchi. Breathing is unlabored. Heart: RRR with S1 S2. No murmur , rubs, or gallops appreciated. Abdomen: Soft, non-tender, non-distended with normoactive bowel sounds. No hepatomegaly. No rebound/guarding. No obvious abdominal masses. Msk:  Strength and tone appear normal for age. Extremities: No clubbing or cyanosis. No edema.  Distal pedal pulses are 2+ and equal bilaterally. Skin: Warm and Dry Neuro: Alert and oriented X 3. CN III-XII intact Grossly normal sensory and motor function . Psych:  Responds to questions appropriately with a normal affect.      Labs: Cardiac Enzymes No results found for this basename: CKTOTAL:4,CKMB:4,TROPONINI:4 in the last 72 hours CBC Lab Results  Component Value Date   WBC 6.5 03/21/2009   HGB 15.1* 03/21/2009   HCT 43.1 03/21/2009   MCV 94.1 03/21/2009   PLT 250 03/21/2009   PROTIME: No results found for this basename: LABPROT:3,INR:3 in the last 72 hours Chemistry No results found for this basename: NA,K,CL,CO2,BUN,CREATININE,CALCIUM,LABALBU,PROT,BILITOT,ALKPHOS,ALT,AST,GLUCOSE in the last 168 hours Lipids No results found for this basename: CHOL,  HDL,  LDLCALC,  TRIG   BNP No results found for this basename: probnp   Miscellaneous No results found for this basename: DDIMER    Radiology/Studies:  No results found.  EKG sinus rhythm without evidence of ventricular preexcitation   Assessment and Plan:   [name]

## 2013-01-28 ENCOUNTER — Encounter: Payer: BC Managed Care – PPO | Admitting: Internal Medicine

## 2013-02-22 ENCOUNTER — Other Ambulatory Visit: Payer: Self-pay | Admitting: Internal Medicine

## 2013-02-22 ENCOUNTER — Other Ambulatory Visit (HOSPITAL_COMMUNITY)
Admission: RE | Admit: 2013-02-22 | Discharge: 2013-02-22 | Disposition: A | Discharge status: Home / Self Care | Payer: BC Managed Care – PPO | Source: Ambulatory Visit | Attending: Internal Medicine | Admitting: Internal Medicine

## 2013-02-22 DIAGNOSIS — Z1151 Encounter for screening for human papillomavirus (HPV): Secondary | ICD-10-CM | POA: Insufficient documentation

## 2013-02-22 DIAGNOSIS — Z01419 Encounter for gynecological examination (general) (routine) without abnormal findings: Secondary | ICD-10-CM | POA: Insufficient documentation

## 2013-07-19 ENCOUNTER — Other Ambulatory Visit: Payer: Self-pay | Admitting: Obstetrics and Gynecology

## 2013-08-11 ENCOUNTER — Other Ambulatory Visit: Payer: Self-pay | Admitting: Gastroenterology

## 2013-08-25 ENCOUNTER — Encounter (HOSPITAL_COMMUNITY): Payer: Self-pay | Admitting: *Deleted

## 2013-09-01 ENCOUNTER — Encounter (HOSPITAL_COMMUNITY): Payer: Self-pay | Admitting: Pharmacy Technician

## 2013-09-20 ENCOUNTER — Encounter (HOSPITAL_COMMUNITY): Payer: Self-pay | Admitting: Registered Nurse

## 2013-09-20 ENCOUNTER — Ambulatory Visit (HOSPITAL_COMMUNITY)
Admission: RE | Admit: 2013-09-20 | Payer: BC Managed Care – PPO | Source: Ambulatory Visit | Admitting: Gastroenterology

## 2013-09-20 SURGERY — COLONOSCOPY WITH PROPOFOL
Anesthesia: Monitor Anesthesia Care

## 2013-09-20 NOTE — Anesthesia Preprocedure Evaluation (Deleted)
Anesthesia Evaluation  Patient identified by MRN, date of birth, ID band Patient awake    Reviewed: Allergy & Precautions, H&P , NPO status , Patient's Chart, lab work & pertinent test results  Airway       Dental  (+) Dental Advisory Given   Pulmonary former smoker,          Cardiovascular negative cardio ROS  + dysrhythmias Supra Ventricular Tachycardia     Neuro/Psych PSYCHIATRIC DISORDERS Anxiety negative neurological ROS  negative psych ROS   GI/Hepatic Neg liver ROS, GERD-  Medicated,  Endo/Other  negative endocrine ROS  Renal/GU negative Renal ROS     Musculoskeletal negative musculoskeletal ROS (+)   Abdominal   Peds  Hematology negative hematology ROS (+)   Anesthesia Other Findings   Reproductive/Obstetrics negative OB ROS                           Anesthesia Physical Anesthesia Plan  ASA: II  Anesthesia Plan: MAC   Post-op Pain Management:    Induction: Intravenous  Airway Management Planned:   Additional Equipment:   Intra-op Plan:   Post-operative Plan:   Informed Consent: I have reviewed the patients History and Physical, chart, labs and discussed the procedure including the risks, benefits and alternatives for the proposed anesthesia with the patient or authorized representative who has indicated his/her understanding and acceptance.   Dental advisory given  Plan Discussed with: CRNA  Anesthesia Plan Comments:         Anesthesia Quick Evaluation

## 2013-12-12 ENCOUNTER — Other Ambulatory Visit: Payer: Self-pay

## 2013-12-15 ENCOUNTER — Telehealth: Payer: Self-pay | Admitting: *Deleted

## 2013-12-15 DIAGNOSIS — Z171 Estrogen receptor negative status [ER-]: Secondary | ICD-10-CM

## 2013-12-15 DIAGNOSIS — C50412 Malignant neoplasm of upper-outer quadrant of left female breast: Secondary | ICD-10-CM | POA: Insufficient documentation

## 2013-12-15 NOTE — Telephone Encounter (Signed)
Left message for a return phone call to schedule patient's [facility] appt.

## 2013-12-15 NOTE — Telephone Encounter (Signed)
Received return phone call from patient.  Confirmed [facility] appt for 12/21/13 at 12N.  Instructions and contact information given.

## 2013-12-21 ENCOUNTER — Encounter (INDEPENDENT_AMBULATORY_CARE_PROVIDER_SITE_OTHER): Payer: Self-pay | Admitting: General Surgery

## 2013-12-21 ENCOUNTER — Encounter (INDEPENDENT_AMBULATORY_CARE_PROVIDER_SITE_OTHER): Payer: Self-pay

## 2013-12-21 ENCOUNTER — Telehealth: Payer: Self-pay | Admitting: *Deleted

## 2013-12-21 ENCOUNTER — Ambulatory Visit: Payer: BC Managed Care – PPO

## 2013-12-21 ENCOUNTER — Encounter: Payer: Self-pay | Admitting: Oncology

## 2013-12-21 ENCOUNTER — Ambulatory Visit
Admission: RE | Admit: 2013-12-21 | Discharge: 2013-12-21 | Disposition: A | Discharge status: Home / Self Care | Payer: BC Managed Care – PPO | Source: Ambulatory Visit | Attending: Radiation Oncology | Admitting: Radiation Oncology

## 2013-12-21 ENCOUNTER — Encounter: Payer: Self-pay | Admitting: *Deleted

## 2013-12-21 ENCOUNTER — Ambulatory Visit: Payer: BC Managed Care – PPO | Attending: General Surgery | Admitting: Physical Therapy

## 2013-12-21 ENCOUNTER — Ambulatory Visit (HOSPITAL_BASED_OUTPATIENT_CLINIC_OR_DEPARTMENT_OTHER): Payer: BC Managed Care – PPO | Admitting: General Surgery

## 2013-12-21 ENCOUNTER — Ambulatory Visit (HOSPITAL_BASED_OUTPATIENT_CLINIC_OR_DEPARTMENT_OTHER): Payer: BC Managed Care – PPO | Admitting: Oncology

## 2013-12-21 ENCOUNTER — Other Ambulatory Visit (HOSPITAL_BASED_OUTPATIENT_CLINIC_OR_DEPARTMENT_OTHER): Payer: BC Managed Care – PPO

## 2013-12-21 ENCOUNTER — Encounter: Payer: Self-pay | Admitting: Radiation Oncology

## 2013-12-21 VITALS — BP 131/83 | HR 74 | Temp 98.1°F | Resp 18 | Ht 62.5 in | Wt 151.6 lb

## 2013-12-21 DIAGNOSIS — N393 Stress incontinence (female) (male): Secondary | ICD-10-CM

## 2013-12-21 DIAGNOSIS — R293 Abnormal posture: Secondary | ICD-10-CM | POA: Insufficient documentation

## 2013-12-21 DIAGNOSIS — I498 Other specified cardiac arrhythmias: Secondary | ICD-10-CM

## 2013-12-21 DIAGNOSIS — C50412 Malignant neoplasm of upper-outer quadrant of left female breast: Secondary | ICD-10-CM

## 2013-12-21 DIAGNOSIS — C50919 Malignant neoplasm of unspecified site of unspecified female breast: Secondary | ICD-10-CM | POA: Insufficient documentation

## 2013-12-21 DIAGNOSIS — F411 Generalized anxiety disorder: Secondary | ICD-10-CM

## 2013-12-21 DIAGNOSIS — C50419 Malignant neoplasm of upper-outer quadrant of unspecified female breast: Secondary | ICD-10-CM

## 2013-12-21 DIAGNOSIS — Z171 Estrogen receptor negative status [ER-]: Secondary | ICD-10-CM

## 2013-12-21 DIAGNOSIS — R000 Tachycardia, unspecified: Secondary | ICD-10-CM | POA: Insufficient documentation

## 2013-12-21 DIAGNOSIS — D649 Anemia, unspecified: Secondary | ICD-10-CM

## 2013-12-21 DIAGNOSIS — IMO0001 Reserved for inherently not codable concepts without codable children: Secondary | ICD-10-CM | POA: Insufficient documentation

## 2013-12-21 LAB — CBC WITH DIFFERENTIAL/PLATELET
BASO%: 1.1 % (ref 0.0–2.0)
Basophils Absolute: 0.1 10*3/uL (ref 0.0–0.1)
EOS%: 2.4 % (ref 0.0–7.0)
Eosinophils Absolute: 0.2 10*3/uL (ref 0.0–0.5)
HCT: 43.5 % (ref 34.8–46.6)
HGB: 15.2 g/dL (ref 11.6–15.9)
LYMPH%: 28.3 % (ref 14.0–49.7)
MCH: 33 pg (ref 25.1–34.0)
MCHC: 35 g/dL (ref 31.5–36.0)
MCV: 94.5 fL (ref 79.5–101.0)
MONO#: 0.6 10*3/uL (ref 0.1–0.9)
MONO%: 7.7 % (ref 0.0–14.0)
NEUT#: 4.7 10*3/uL (ref 1.5–6.5)
NEUT%: 60.5 % (ref 38.4–76.8)
Platelets: 275 10*3/uL (ref 145–400)
RBC: 4.61 10*6/uL (ref 3.70–5.45)
RDW: 13.5 % (ref 11.2–14.5)
WBC: 7.7 10*3/uL (ref 3.9–10.3)
lymph#: 2.2 10*3/uL (ref 0.9–3.3)

## 2013-12-21 LAB — COMPREHENSIVE METABOLIC PANEL (CC13)
ALT: 11 U/L (ref 0–55)
AST: 12 U/L (ref 5–34)
Albumin: 3.9 g/dL (ref 3.5–5.0)
Alkaline Phosphatase: 52 U/L (ref 40–150)
Anion Gap: 8 mEq/L (ref 3–11)
BUN: 10.5 mg/dL (ref 7.0–26.0)
CO2: 27 mEq/L (ref 22–29)
Calcium: 9.5 mg/dL (ref 8.4–10.4)
Chloride: 103 mEq/L (ref 98–109)
Creatinine: 0.8 mg/dL (ref 0.6–1.1)
Glucose: 95 mg/dl (ref 70–140)
Potassium: 4.1 mEq/L (ref 3.5–5.1)
Sodium: 139 mEq/L (ref 136–145)
Total Bilirubin: 0.43 mg/dL (ref 0.20–1.20)
Total Protein: 7 g/dL (ref 6.4–8.3)

## 2013-12-21 NOTE — Progress Notes (Signed)
Checked in new pt with no financial concerns. °

## 2013-12-21 NOTE — Progress Notes (Addendum)
Patient ID: Sheri Arellano, female   DOB: Nov 11, 1951, 63 y.o.   MRN: [id number]  No chief complaint on file.   HPI Sheri Arellano is a 63 y.o. female.  She is referred by Dr. Gabriel Rainwater at [facility] mammography for evaluation and management of a newly diagnosed invasive cancer of the left breast, upper outer quadrant, triple negative breast cancer. Dr. Mertha Finders is her PCP.  She is being evaluated in the [facility] today by Dr. Duke Salvia, Dr. Jana Hakim, and me. 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  She gets yearly mammograms. Recent mammogram showed a 4 mm density in the left breast, upper outer quadrant, less than 1 cm from the skin. Image guided biopsy shows invasive mammary carcinoma, probably invasive ductal carcinoma. T. 6772%. Triple negative breast cancer. She has not had an MRI.  She states that her family wants her to have bilateral mastectomies. We all spent a long time discussing this. She thought a mastectomy would prevent her from having chemotherapy, but now she understands that that is not necessarily so. She has been advised that mastectomy does not confer a survival benefit, although it does confirm a reduced  recurrence incidence.  Family history reveals breast cancer in a maternal and. Otherwise family history is negative.  Comorbidities include past history of supraventricular tachycardia, anxiety, GERD, cholecystectomy.Marland Kitchen HPI  Past Medical History  Diagnosis Date  . Esophageal dysmotility   . Anxiety     probable panic attacks  . Insomnia   . Iron deficiency anemia   . GERD (gastroesophageal reflux disease)   . Hyperlipidemia   . SVT (supraventricular tachycardia)     documented  . Dysphagia   . Vitamin D deficiency   . Other malaise and fatigue     Past Surgical History  Procedure Laterality Date  . Carpal tunnel release    . Cholecystectomy  1999    Family History  Problem Relation Age of Onset  . Atrial fibrillation Mother   . Hyperlipidemia Mother   .  Hypertension Mother   . Cancer Father     bladder  . Cancer Maternal Aunt     breast    Social History History  Substance Use Topics  . Smoking status: Former Smoker    Quit date: 01/22/1976  . Smokeless tobacco: Former Systems developer    Quit date: 02/16/1975  . Alcohol Use: 0.6 oz/week    1 Glasses of wine per week     Comment: rarely    Allergies  Allergen Reactions  . Erythromycin Base Other (See Comments)    Stomach ache    Current Outpatient Prescriptions  Medication Sig Dispense Refill  . aspirin EC 81 MG tablet Take 81 mg by mouth daily.      . Cholecalciferol (VITAMIN D) 2000 UNITS tablet Take 2,000 Units by mouth daily.      . clonazePAM (KLONOPIN) 1 MG tablet Take 1 mg by mouth at bedtime as needed (sleep).       . Fiber-Vitamins-Minerals (FIBERALL PO) Take 2 tablets by mouth every evening.       . loratadine (CLARITIN) 10 MG tablet Take 10 mg by mouth daily.      . Multiple Vitamin (MULTIVITAMIN WITH MINERALS) TABS tablet Take 1 tablet by mouth daily.      . nitroGLYCERIN (NITROSTAT) 0.4 MG SL tablet Place 0.4 mg under the tongue every 5 (five) minutes as needed for chest pain.       . pantoprazole (PROTONIX) 40 MG  tablet Take 40 mg by mouth daily.      . sertraline (ZOLOFT) 100 MG tablet Take 50 mg by mouth daily. Takes 1/2 daily      . simvastatin (ZOCOR) 40 MG tablet Take 20 mg by mouth at bedtime. Takes 1/2 daily       No current facility-administered medications for this visit.    Review of Systems Review of Systems  Cardiovascular: Positive for palpitations.  Psychiatric/Behavioral: The patient is nervous/anxious.     There were no vitals taken for this visit.  Physical Exam Physical Exam  Constitutional: She is oriented to person, place, and time. She appears well-developed and well-nourished. No distress.  HENT:  Head: Normocephalic and atraumatic.  Nose: Nose normal.  Mouth/Throat: No oropharyngeal exudate.  Eyes: Conjunctivae and EOM are normal.  Pupils are equal, round, and reactive to light. Left eye exhibits no discharge. No scleral icterus.  Neck: Neck supple. No JVD present. No tracheal deviation present. No thyromegaly present.  Cardiovascular: Normal rate, regular rhythm, normal heart sounds and intact distal pulses.   No murmur heard. Pulmonary/Chest: Effort normal and breath sounds normal. No respiratory distress. She has no wheezes. She has no rales. She exhibits no tenderness.  Bruise and a tiny hematoma, upper outer quadrant, left breast. No other abnormality in either breast. No axillary adenopathy on either side.  Abdominal: Soft. Bowel sounds are normal. She exhibits no distension and no mass. There is no tenderness. There is no rebound and no guarding.  Well-healed laparoscopic scars from cholecystectomy  Musculoskeletal: She exhibits no edema and no tenderness.  Lymphadenopathy:    She has no cervical adenopathy.  Neurological: She is alert and oriented to person, place, and time. She exhibits normal muscle tone. Coordination normal.  Skin: Skin is warm. No rash noted. She is not diaphoretic. No erythema. No pallor.  Psychiatric: She has a normal mood and affect. Her behavior is normal. Judgment and thought content normal.    Data Reviewed I have reviewed her imaging studies, histology, and I have coordinated her treatment plan with Dr. Sondra Come and Dr. Jana Hakim. I have discussed her treatment plan in breast conference this morning.  Assessment    Invasive adenocarcinoma left breast, upper outer quadrant, 4 mm diameter, triple negative breast cancer. She is a good candidate for breast conservation surgery, although she is not certain that that is her decision at this time. She is still contemplating whether she wants bilateral mastectomies or not.  If we do perform a left partial mastectomy, I am considering taking an ellipse of skin because the tumor is so close to the surface  She is not a candidate for MammoSite  because the tumor is so close to the skin  History supraventricular tachycardia  Chronic anxiety  GERD     Plan    I had a very long talk with the patient and her family. We talked about all the surgical options including breast conservation, mastectomy, with or without plastic surgical reconstruction. I told her that my bias was to perform a left partial mastectomy with sentinel node biopsy because the tumor is so small. She understands these issues. She has not yet made a decision.  She will be scheduled for bilateral breast MRI because she is triple negative breast cancer.She understands that if the MRI shows other areas of suspicious enhancement, we would need to postpone surgery until she had further evaluation and possible further biopsies.  ADDENDUM:   MRI shows the biopsy-proven cancer in the  upper outer quadrant. There is a second area, a 1 cm retroareolar mass that warrants biopsy. This was discussed with the patient. She is scheduled for second look ultrasound and and guided biopsy at [facility] on 01/03/2014.  ADDENDUM (01/16/2014):  MRI guided biopsy of second area in upper central left breast reveals the following:   Pathology revealed fibrocystic change, columnar cell change,  pseudoangiomatous stromal hyperplasia (PASH) and fibroadenomatoid  nodules in the left breast. This was found to be concordant by Dr.  Lovey Newcomer. Surgical plan is therefore to proceed with Breast conservation surgery.   Dr. Jana Hakim does not want to put a Port-A-Cath in until we see what the final pathology is. If the tumor is greater than 5 mm then we may need to put a Port-A-Cath for chemotherapy at a later date. The patient is aware of this.  We agreed that she would call office tomorrow after considering her decision overnight and let me know what she has decided.         Edsel Petrin. Dalbert Batman, M.D., [facility] General and Minimally invasive Surgery Breast and Colorectal  Surgery Office:   [phone] Pager:   [phone]  12/21/2013, 4:26 PM

## 2013-12-21 NOTE — Telephone Encounter (Signed)
appts made and printed...td 

## 2013-12-21 NOTE — Patient Instructions (Signed)
You have been diagnosed with a very small, 4 mm invasive carcinoma of the left breast, upper outer quadrant. This is a triple negative breast cancer. You may or may not need chemotherapy.  That will be decided after you have your definitive surgery.  We have talked extensively about lumpectomy, sentinel node biopsy, mastectomy with sentinel biopsy, plastic surgical reconstruction. You have explored the option of bilateral mastectomy and we have discussed the advantages and disadvantages of that.  You are going to be scheduled for an MRI because this is a triple negative breast cancer. We're doing this to make sure that there are not other areas of enhancement that need to be evaluated or biopsied prior to definitive surgery.  You have been told that you are not a candidate for MammoSite insertion because the tumor is too close to the skin.  We did not make a final decision about the extent of surgery today. Dr. Dalbert Batman has advised breast conservation surgery, but mastectomy is also an acceptable alternative option. There is no evidence of survival advantage to mastectomy however.  You have stated that he will call Dr. Dalbert Batman office tomorrow with your decision about the extent of surgery, if you have made a decision at that time.     Lumpectomy A lumpectomy is a form of "breast conserving" or "breast preservation" surgery. It may also be referred to as a partial mastectomy. During a lumpectomy, the portion of the breast that contains the cancerous tumor or breast mass (the lump) is removed. Some normal tissue around the lump may also be removed to make sure all the tumor has been removed. This surgery should take 40 minutes or less. LET [facility] CARE PROVIDER KNOW ABOUT:  Any allergies you have.  All medicines you are taking, including vitamins, herbs, eye drops, creams, and over-the-counter medicines.  Previous problems you or members of your family have had with the use of  anesthetics.  Any blood disorders you have.  Previous surgeries you have had.  Medical conditions you have. RISKS AND COMPLICATIONS Generally, this is a safe procedure. However, as with any procedure, complications can occur. Possible complications include:  Bleeding.  Infection.  Pain.  Temporary swelling.  Change in the shape of the breast, particularly if a large portion is removed. BEFORE THE PROCEDURE  Ask your health care provider about changing or stopping your regular medicines.  Do not eat or drink anything for 7 8 hours before the surgery or as directed by your health care provider. Ask your health care provider if you can take a sip of water with any approved medicines.  On the day of surgery, your healthcare provider will use a mammogram or ultrasound to locate and mark the tumor in your breast. These markings on your breast will show where the cut (incision) will be made. PROCEDURE   An IV tube will be put into one of your veins.  You may be given medicine to help you relax before the surgery (sedative). You will be given one of the following:  A medicine that numbs the area (local anesthesia).  A medicine that makes you go to sleep (general anesthesia).  Your health care provider will use a kind of electric scalpel that uses heat to minimize bleeding (electrocautery knife).  A curved incision (like a smile or frown) that follows the natural curve of your breast is made, to allow for minimal scarring and better healing.  The tumor will be removed with some of the surrounding tissue.  This will be sent to the lab for analysis. Your health care provider may also remove your lymph nodes at this time if needed.  Sometimes, but not always, a rubber tube called a drain will be surgically inserted into your breast area or armpit to collect excess fluid that may accumulate in the space where the tumor was. This drain is connected to a plastic bulb on the outside of your  body. This drain creates suction to help remove the fluid.  The incisions will be closed with stitches (sutures).  A bandage may be placed over the incisions. AFTER THE PROCEDURE  You will be taken to the recovery area.  You will be given medicine for pain.  A small rubber drain may be placed in the breast for 2 3 days to prevent a collection of blood (hematoma) from developing in the breast. You will be given instructions on caring for the drain before you go home.  A pressure bandage (dressing) will be applied for 1 2 days to prevent bleeding. Ask your health care provider how to care for your bandage at home. Document Released: 01/05/2007 Document Revised: 07/27/2013 Document Reviewed: 04/29/2013 [facility] Patient Information 2014 [place].      Mastectomy, With or Without Reconstruction Mastectomy (removal of the breast) is a procedure most commonly used to treat cancer (tumor) of the breast. Different procedures are available for treatment. This depends on the stage of the tumor (abnormal growths). Discuss this with your caregiver, surgeon (a specialist for performing operations such as this), or oncologist (someone specialized in the treatment of cancer). With proper information, you can decide which treatment is best for you. Although the sound of the word cancer is frightening to all of [place], the new treatments and medications can be a source of reassurance and comfort. If there are things you are worried about, discuss them with your caregiver. He or she can help comfort you and your family. Some of the different procedures for treating breast cancer are:  Radical (extensive) mastectomy. This is an operation used to remove the entire breast, the muscles under the breast, and all of the glands (lymph nodes) under the arm. With all of the new treatments available for cancer of the breast, this procedure has become less common.  Modified radical mastectomy. This is a similar  operation to the radical mastectomy described above. In the modified radical mastectomy, the muscles of the chest wall are not removed unless one of the lessor muscles is removed. One of the lessor muscles may be removed to allow better removal of the lymph nodes. The axillary lymph nodes are also removed. Rarely, during an axillary node dissection nerves to this area are damaged. Radiation therapy is then often used to the area following this surgery.  A total mastectomy also known as a complete or simple mastectomy. It involves removal of only the breast. The lymph nodes and the muscles are left in place.  In a lumpectomy, the lump is removed from the breast. This is the simplest form of surgical treatment. A sentinel lymph node biopsy may also be done. Additional treatment may be required. RISKS AND COMPLICATIONS The main problems that follow removal of the breast include:  Infection (germs start growing in the wound). This can usually be treated with antibiotics (medications that kill germs).  Lymphedema. This means the arm on the side of the breast that was operated on swells because the lymph (tissue fluid) cannot follow the main channels back into the body.  This only occurs when the lymph nodes have had to be removed under the arm.  There may be some areas of numbness to the upper arm and around the incision (cut by the surgeon) in the breast. This happens because of the cutting of or damage to some of the nerves in the area. This is most often unavoidable.  There may be difficulty moving the arm in a full range of motion (moving in all directions) following surgery. This usually improves with time following use and exercise.  Recurrence of breast cancer may happen with the very best of surgery and follow up treatment. Sometimes small cancer cells that cannot be seen with the naked eye have already spread at the time of surgery. When this happens other treatment is available. This treatment  may be radiation, medications or a combination of both. RECONSTRUCTION Reconstruction of the breast may be done immediately if there is not going to be post-operative radiation. This surgery is done for cosmetic (improve appearance) purposes to improve the physical appearance after the operation. This may be done in two ways:  It can be done using a saline filled prosthetic (an artificial breast which is filled with salt water). Silicone breast implants are now re-approved by the FDA and are being commonly used.  Reconstruction can be done using the body's own muscle/fat/skin. Your caregiver will discuss your options with you. Depending upon your needs or choice, together you will be able to determine which procedure is best for you. Document Released: 08/19/2001 Document Revised: 08/18/2012 Document Reviewed: 04/11/2008 [facility] Patient Information 2014 [place].

## 2013-12-21 NOTE — Progress Notes (Signed)
Radiation Oncology         [phone] ________________________________  Initial outpatient Consultation  Name: Sheri Arellano MRN: [id number]  Date: 12/21/2013  DOB: [birth year]  WE:[id number],IRCVEL NEVILL, MD  [name], MD, Magrinat, Sarajane Jews, MD  REFERRING PHYSICIAN: [name], MD  DIAGNOSIS: Clinical stage IA invasive ductal carcinoma of the left breast (triple negative)  HISTORY OF PRESENT ILLNESS::Sheri Arellano is a 63 y.o. female who is seen out of the courtesy of Dr. Fanny Skates as part of the multidisciplinary breast clinic. On routine screening mammography the patient was noted to have a new irregular, nodular density within the 1 o'clock position of the left breast. The patient proceeded to undergo a left breast ultrasound which revealed a  4 mm irregular mass within the 2:00 position of the left breast.  this irregular mass showed hypo-echogenicity with  posterior acoustic shadowing.. A biopsy of this area was performed which revealed invasive mammary carcinoma favoring a ductal phenotype. The tumor appeared to be Grade I on this initial biopsy. The tumor was triple negative with proliferation  marker of 72%. With this information the patient is now seen as part of the multidisciplinary breast clinic.Marland Kitchen  PREVIOUS RADIATION THERAPY: No  PAST MEDICAL HISTORY:  has a past medical history of Esophageal dysmotility; Anxiety; Insomnia; Iron deficiency anemia; GERD (gastroesophageal reflux disease); Hyperlipidemia; SVT (supraventricular tachycardia); Dysphagia; Vitamin D deficiency; and Other malaise and fatigue.    PAST SURGICAL HISTORY: Past Surgical History  Procedure Laterality Date  . Carpal tunnel release    . Cholecystectomy  1999    FAMILY HISTORY: family history includes Atrial fibrillation in her mother; Cancer in her father and maternal aunt; Hyperlipidemia in her mother; Hypertension in her mother.  SOCIAL HISTORY:  reports that she quit smoking about 37  years ago. She quit smokeless tobacco use about 38 years ago. She reports that she drinks about 0.6 ounces of alcohol per week. She reports that she does not use illicit drugs. she works in [place]: [employer]  MEDICATIONS:  Current Outpatient Prescriptions  Medication Sig Dispense Refill  . aspirin EC 81 MG tablet Take 81 mg by mouth daily.      . Cholecalciferol (VITAMIN D) 2000 UNITS tablet Take 2,000 Units by mouth daily.      . clonazePAM (KLONOPIN) 1 MG tablet Take 1 mg by mouth at bedtime as needed (sleep).       . Fiber-Vitamins-Minerals (FIBERALL PO) Take 2 tablets by mouth every evening.       . loratadine (CLARITIN) 10 MG tablet Take 10 mg by mouth daily.      . Multiple Vitamin (MULTIVITAMIN WITH MINERALS) TABS tablet Take 1 tablet by mouth daily.      . nitroGLYCERIN (NITROSTAT) 0.4 MG SL tablet Place 0.4 mg under the tongue every 5 (five) minutes as needed for chest pain.       . pantoprazole (PROTONIX) 40 MG tablet Take 40 mg by mouth daily.      . sertraline (ZOLOFT) 100 MG tablet Take 50 mg by mouth daily. Takes 1/2 daily      . simvastatin (ZOCOR) 40 MG tablet Take 20 mg by mouth at bedtime. Takes 1/2 daily       No current facility-administered medications for this encounter.    REVIEW OF SYSTEMS:  A 15 point review of systems is documented in the electronic medical record. This was obtained by the nursing staff. However, I reviewed this  with the patient to discuss relevant findings and make appropriate changes.  She has some discomfort in the breast which she attributes to the way she sleeps at night. She denies any nipple discharge or bleeding. She denies any new bony pain headaches dizziness or blurred vision.   PHYSICAL EXAM: Vital signs reveal a pulse of 74, respirations 18 temperature 98.1, blood pressure 131/83 patient's weight is 151 pounds, height 5' 8 2-1/2 inches BMI 27.3.  Gen. this is a very pleasant healthy-appearing 63 year old  female in no acute distress. She is accompanied by her husband and brother-in-law on evaluation today. Examination of the neck and supraclavicular region reveals no evidence of adenopathy. The axillary areas are free of adenopathy. Examination of the lungs reveals them to be clear. The heart has a regular rhythm and rate. Examination of the right breast reveals it to be large and pendulous without mass or nipple discharge. Examination of the left breast reveals it to also be large and pendulous without mass or nipple discharge. There is some bruising in the upper-outer quadrant of the left breast but no palpable mass is appreciated.    ECOG = 0  0 - Asymptomatic (Fully active, able to carry on all predisease activities without restriction)  LABORATORY DATA:  Lab Results  Component Value Date   WBC 7.7 12/21/2013   HGB 15.2 12/21/2013   HCT 43.5 12/21/2013   MCV 94.5 12/21/2013   PLT 275 12/21/2013   Lab Results  Component Value Date   NA 139 12/21/2013   K 4.1 12/21/2013   CL 104 03/21/2009   CO2 27 12/21/2013   Lab Results  Component Value Date   ALT 11 12/21/2013   AST 12 12/21/2013   ALKPHOS 52 12/21/2013   BILITOT 0.43 12/21/2013     RADIOGRAPHY: [facility]    IMPRESSION: Clinical stage I invasive ductal carcinoma of the left breast. The patient would appear to be a good candidate for breast conservation therapy with lumpectomy/ sentinel node procedure and radiation therapy directed at the left breast. I also discussed the mastectomy is an option for local management with the patient. She will need to an MRI of the breast area to complete her evaluation prior to proceeding with surgery. This will be scheduled in the near future.  Patient is hesitant in considering radiation therapy as part of her overall management particularly given the fact that 2 of her friends have developed complications from radiation therapy for breast cancer. The patient did inquire about partial breast radiation  therapy but given the superficial location of her lesion this would not be a reasonable option. This option can be further evaluated with her MRI images.  She will be seen by Dr. Dalbert Batman after she has completed her MRI for further evaluation and planning of her surgical approach. ------------------------------------------------  -----------------------------------  Blair Promise, PhD, MD

## 2013-12-22 ENCOUNTER — Encounter: Payer: Self-pay | Admitting: *Deleted

## 2013-12-22 ENCOUNTER — Other Ambulatory Visit: Payer: Self-pay | Admitting: *Deleted

## 2013-12-22 DIAGNOSIS — C50412 Malignant neoplasm of upper-outer quadrant of left female breast: Secondary | ICD-10-CM

## 2013-12-22 NOTE — Progress Notes (Signed)
Sheri Arellano  [phone] [phone]     ID: Sheri Arellano OB: [birth year]  MR#: [id number]  ELF#:[id number]  PCP: Sheri Screws, MD GYN:  Sheri Arellano SU: Sheri Arellano OTHER MD: Sheri Arellano  CHIEF COMPLAINT: "I am considering having both breasts taken off".  HISTORY OF PRESENT ILLNESS: Sheri Arellano had routine screening mammography at [facility] 11/21/2013 showing an irregular density in the left breast at the 1:00 position. Ultrasound was obtained 11/29/2013 and showed a 4 mm tolerate them live irregular mass in the left breast at the 2:00 position. This was hypoechoic. Biopsy of this area 12/12/2013 showed ([id number]) an invasive ductal carcinoma, grade 1 or 2, estrogen receptor 0%, progesterone receptor 0%, with no HER-2 amplification, the signals ratio being 1.09 and the number per cell be 1.95. The MIB-1-1 was 72%.  The patient subsequent history is as detailed below  INTERVAL HISTORY: Sheri Arellano was evaluated in the multidisciplinary breast cancer clinic 12/21/2013 accompanied by her husband Sheri Arellano. Her case was also discussed at the multidisciplinary breast cancer conference that morning.  REVIEW OF SYSTEMS: There were no specific symptoms leading to the original mammogram, which was routinely scheduled. The patient denies unusual headaches, visual changes, nausea, vomiting, stiff neck, dizziness, or gait imbalance. There has been no cough, phlegm production, or pleurisy, no chest pain or pressure, and no change in bowel or bladder habits. She admits to mild stress urinary incontinence and to some anxiety related to her new diagnosis. She has chronic mild heartburn The patient denies fever, rash, bleeding, unexplained fatigue or unexplained weight loss. A detailed review of systems was otherwise entirely negative.  PAST MEDICAL HISTORY: Past Medical History  Diagnosis Date  . Esophageal dysmotility   . Anxiety     probable panic attacks  . Insomnia     . Iron deficiency anemia   . GERD (gastroesophageal reflux disease)   . Hyperlipidemia   . SVT (supraventricular tachycardia)     documented  . Dysphagia   . Vitamin D deficiency   . Other malaise and fatigue     PAST SURGICAL HISTORY: Past Surgical History  Procedure Laterality Date  . Carpal tunnel release    . Cholecystectomy  1999    FAMILY HISTORY Family History  Problem Relation Age of Onset  . Atrial fibrillation Mother   . Hyperlipidemia Mother   . Hypertension Mother   . Cancer Father     bladder  . Cancer Maternal Aunt     breast   the patient's parents are living, both in their 56s. The patient had one brother and one no sisters. One of the patient's mothers 3 sisters was diagnosed with breast cancer before the age of 52. There is no history of ovarian cancer in the family.  GYNECOLOGIC HISTORY:  Menarche age 52, first live birth age 44, the patient is Sheri Arellano. She underwent menopause at age 8, and has been on hormone replacement for the last 23 years (estradiol and norethindrone most recently). 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 Sheri Arellano used to work for [employer]. Their son date lives in [place] where he owns an Electronics engineer and [employer] [place] products. Son Sheri Arellano lives in [place], currently working for [employer]. The patient has 3 grandchildren. She is a Furniture conservator/restorer.    ADVANCED DIRECTIVES: In place   HEALTH MAINTENANCE: History  Substance Use Topics  . Smoking status: Former Audiological scientist  date: 01/22/1976  . Smokeless tobacco: Former Systems developer    Quit date: 02/16/1975  . Alcohol Use: 0.6 oz/week    1 Glasses of wine per week     Comment: rarely     Colonoscopy:  PAP:  Bone density:  Lipid panel:  Allergies  Allergen Reactions  . Erythromycin Base Other (See Comments)    Stomach ache    Current Outpatient Prescriptions  Medication Sig Dispense Refill  . aspirin EC 81 MG  tablet Take 81 mg by mouth daily.      . clonazePAM (KLONOPIN) 1 MG tablet Take 1 mg by mouth at bedtime as needed (sleep).       . Fiber-Vitamins-Minerals (FIBERALL PO) Take 2 tablets by mouth every evening.       . loratadine (CLARITIN) 10 MG tablet Take 10 mg by mouth daily.      . Multiple Vitamin (MULTIVITAMIN WITH MINERALS) TABS tablet Take 1 tablet by mouth daily.      . nitroGLYCERIN (NITROSTAT) 0.4 MG SL tablet Place 0.4 mg under the tongue every 5 (five) minutes as needed for chest pain.       . pantoprazole (PROTONIX) 40 MG tablet Take 40 mg by mouth daily.      . sertraline (ZOLOFT) 100 MG tablet Take 50 mg by mouth daily. Takes 1/2 daily      . simvastatin (ZOCOR) 40 MG tablet Take 20 mg by mouth at bedtime. Takes 1/2 daily      . Cholecalciferol (VITAMIN D) 2000 UNITS tablet Take 2,000 Units by mouth daily.       No current facility-administered medications for this visit.    OBJECTIVE: Middle-aged white woman in no acute distress Filed Vitals:   12/21/13 1253  BP: 131/83  Pulse: 74  Temp: 98.1 F (36.7 C)  Resp: 18     Body mass index is 27.27 kg/(m^2).    ECOG FS:0 - Asymptomatic  Ocular: Sclerae unicteric, pupils equal, round and reactive to light Ear-nose-throat: Oropharynx clear, dentition fair Lymphatic: No cervical or supraclavicular adenopathy Lungs no rales or rhonchi, good excursion bilaterally Heart regular rate and rhythm, no murmur appreciated Abd soft, nontender, positive bowel sounds MSK no focal spinal tenderness, no joint edema Neuro: non-focal, well-oriented, appropriate affect Breasts: The right breast is unremarkable. The left breast is status post recent biopsy. There is some mild associated ecchymosis. I do not palpate a mass and there is no skin or nipple change of concern. The left axilla is benign.   LAB RESULTS:  CMP     Component Value Date/Time   NA 139 12/21/2013 1229   NA 137 03/21/2009 1202   K 4.1 12/21/2013 1229   K 3.3* 03/21/2009  1202   CL 104 03/21/2009 1202   CO2 27 12/21/2013 1229   CO2 27 03/21/2009 1202   GLUCOSE 95 12/21/2013 1229   GLUCOSE 110* 03/21/2009 1202   BUN 10.5 12/21/2013 1229   BUN 9 03/21/2009 1202   CREATININE 0.8 12/21/2013 1229   CREATININE 0.75 03/21/2009 1202   CALCIUM 9.5 12/21/2013 1229   CALCIUM 8.8 03/21/2009 1202   PROT 7.0 12/21/2013 1229   ALBUMIN 3.9 12/21/2013 1229   AST 12 12/21/2013 1229   Arellano 11 12/21/2013 1229   ALKPHOS 52 12/21/2013 1229   BILITOT 0.43 12/21/2013 1229   GFRNONAA >60 03/21/2009 1202   GFRAA  Value: >60        The eGFR has been calculated using the MDRD equation. This calculation has  not been validated in all clinical situations. eGFR's persistently <60 mL/min signify possible Chronic Kidney Disease. 03/21/2009 1202    I No results found for this basename: SPEP, UPEP,  kappa and lambda light chains    Lab Results  Component Value Date   WBC 7.7 12/21/2013   NEUTROABS 4.7 12/21/2013   HGB 15.2 12/21/2013   HCT 43.5 12/21/2013   MCV 94.5 12/21/2013   PLT 275 12/21/2013      Chemistry      Component Value Date/Time   NA 139 12/21/2013 1229   NA 137 03/21/2009 1202   K 4.1 12/21/2013 1229   K 3.3* 03/21/2009 1202   CL 104 03/21/2009 1202   CO2 27 12/21/2013 1229   CO2 27 03/21/2009 1202   BUN 10.5 12/21/2013 1229   BUN 9 03/21/2009 1202   CREATININE 0.8 12/21/2013 1229   CREATININE 0.75 03/21/2009 1202      Component Value Date/Time   CALCIUM 9.5 12/21/2013 1229   CALCIUM 8.8 03/21/2009 1202   ALKPHOS 52 12/21/2013 1229   AST 12 12/21/2013 1229   Arellano 11 12/21/2013 1229   BILITOT 0.43 12/21/2013 1229       No results found for this basename: LABCA2    No components found with this basename: LABCA125    No results found for this basename: INR,  in the last 168 hours  Urinalysis    Component Value Date/Time   COLORURINE YELLOW 03/21/2009 1122   APPEARANCEUR CLOUDY* 03/21/2009 1122   LABSPEC 1.011 03/21/2009 1122   PHURINE 7.5 03/21/2009 1122   GLUCOSEU NEGATIVE  03/21/2009 1122   HGBUR NEGATIVE 03/21/2009 1122   BILIRUBINUR NEGATIVE 03/21/2009 1122   KETONESUR NEGATIVE 03/21/2009 1122   PROTEINUR NEGATIVE 03/21/2009 1122   UROBILINOGEN 0.2 03/21/2009 1122   NITRITE NEGATIVE 03/21/2009 1122   LEUKOCYTESUR NEGATIVE MICROSCOPIC NOT DONE ON URINES WITH NEGATIVE PROTEIN, BLOOD, LEUKOCYTES, NITRITE, OR GLUCOSE <1000 mg/dL. 03/21/2009 1122    STUDIES: No results found.  ASSESSMENT: 63 y.o. [place] woman status post left breast biopsy 12/12/2013 for a clinical T1a N0 invasive ductal carcinoma, low to intermediate grade, triple negative, with an MIB-1 of 72%  PLAN: We spent the better part of today's hour-long appointment discussing the biology of breast cancer in general, and the specifics of the patient's tumor in particular. Sheri Arellano understands the treatment of breast cancer has local and systemic components. From a local treatment point of view, the survival is identical for mastectomy versus lumpectomy plus radiation. She is an excellent candidate for breast conservation and that was my strong recommendation to her.  From a systemic treatment point of view her only option would be chemotherapy. However, NCCN guidelines suggest no systemic adjuvant treatment for T1a invasive breast cancers. (For T1b cases they suggest chemotherapy be considered; it is not positive leg recommended). Accordingly we are not requesting that a port be placed at the time of surgery.   The patient understands estrogen and itself does not cause breast cancer. It can cause cancer of the uterus however. Estrogen and progesterone given together do not cause cancer of the uterus, but they do cause breast cancer. I find it correct that she stopped her hormone replacement at this point since we have no reliable date that in patients with a history of breast cancer, whether estrogen receptor positive are not, hormone replacement is safe.  Sheri Arellano is interested in seeking a second opinion. We  have no problem with that and I reassured her that she  has time to do so. We will be glad to help her organize that if that is her decision.  Otherwise I will see the patient again in a little over a month. By that time we should have all the data we need to make a definitive decision regarding chemotherapy. We will also discuss followup at that point.  Sheri Arellano has a good understanding of the overall plan, and agrees with it. She knows a goal of treatment in her case is cure. She will call with any problems that may develop before her next visit here.   Chauncey Cruel, MD   12/22/2013 7:14 PM

## 2013-12-22 NOTE — Progress Notes (Signed)
[place] Work  Clinical Social Work met with Pt at [employer] to introduce self and was for assessment of psychosocial needs.  Pt declined to complete the Distress Screen, stating she had no distress currently. Clinical Social Worker educated Pt about the Distress Screen and discussed how she may see the screen again and that it's ok to have varying degrees of distress during her cancer treatment. Pt also educated Pt and husband about resources at the [facility], [employer], [employer], etc. They both report not needing help currently and agree to reach out to CSW as needed. CSW plans to follow up as needed and keep a look out for this Pt as her needs may change.      Clinical Social Work interventions: Supportive Engineer, agricultural.  Loren Racer, LCSW Clinical Social Worker [facility] Wednesday, Thursday and Friday Phone: [phone] Fax: [phone]

## 2013-12-23 ENCOUNTER — Other Ambulatory Visit: Payer: Self-pay | Admitting: *Deleted

## 2013-12-23 ENCOUNTER — Telehealth (INDEPENDENT_AMBULATORY_CARE_PROVIDER_SITE_OTHER): Payer: Self-pay | Admitting: General Surgery

## 2013-12-23 ENCOUNTER — Other Ambulatory Visit (INDEPENDENT_AMBULATORY_CARE_PROVIDER_SITE_OTHER): Payer: Self-pay | Admitting: General Surgery

## 2013-12-23 ENCOUNTER — Telehealth: Payer: Self-pay | Admitting: *Deleted

## 2013-12-23 DIAGNOSIS — C50912 Malignant neoplasm of unspecified site of left female breast: Secondary | ICD-10-CM

## 2013-12-23 NOTE — Telephone Encounter (Signed)
Pt left message stating she was told she would be scheduled for an MRI per visit 1/14 and called with appt yesterday-  " it is Friday and no one has called me ".  This RN noted order for MRI of breast placed on 1/14 with no scheduled appointment.  This RN called pt to inform her message was received and discussed noted order placed but delay may be related to need to obtain authorization through her insurance for study to be done.  This RN informed pt her message would be given to navigator for follow up.  Pt stated appreciation for call.  VM forward to Keisha's VM.

## 2013-12-23 NOTE — Telephone Encounter (Signed)
Patient called the office earlier with her decision to have breast conservation surgery. I called her this evening to discuss this in detail. She is now very comfortable proceeding with left partial mastectomy with sentinel lymph node biopsy.  Apparently, her MRI is not scheduled yet. Our office   will schedule the MRI next week, and we'll schedule her definitive breast surgery several days after that.  The patient is aware that if the MRI shows other areas of suspicious enhancement, that we would need to postpone her surgery until further biopsies were done. She  completely understands this. She was offered another office visit but states  she does not need at this time.   Edsel Petrin. Dalbert Batman, M.D., [facility] General and Minimally invasive Surgery Breast and Colorectal Surgery Office:   [phone] Pager:   [phone]

## 2013-12-26 ENCOUNTER — Telehealth: Payer: Self-pay | Admitting: *Deleted

## 2013-12-26 ENCOUNTER — Encounter: Payer: Self-pay | Admitting: Oncology

## 2013-12-26 ENCOUNTER — Other Ambulatory Visit: Payer: BC Managed Care – PPO

## 2013-12-26 NOTE — Progress Notes (Signed)
MRI bilat breast @ [facility] auth # [id number] 12/26/2013 - 06/28/2014

## 2013-12-26 NOTE — Telephone Encounter (Signed)
Confirmed appt for MRI breast on 12/29/13 at 0945.

## 2013-12-27 ENCOUNTER — Telehealth: Payer: Self-pay | Admitting: *Deleted

## 2013-12-27 NOTE — Telephone Encounter (Signed)
Called and spoke with patient from [facility] 12/21/13.  She had multiple questions regarding her breast cancer.  Support given.

## 2013-12-28 ENCOUNTER — Other Ambulatory Visit: Payer: BC Managed Care – PPO

## 2013-12-29 ENCOUNTER — Ambulatory Visit
Admission: RE | Admit: 2013-12-29 | Discharge: 2013-12-29 | Disposition: A | Discharge status: Home / Self Care | Payer: BC Managed Care – PPO | Source: Ambulatory Visit | Attending: Oncology | Admitting: Oncology

## 2013-12-29 DIAGNOSIS — C50412 Malignant neoplasm of upper-outer quadrant of left female breast: Secondary | ICD-10-CM

## 2013-12-29 MED ORDER — GADOBENATE DIMEGLUMINE 529 MG/ML IV SOLN
14.0000 mL | Freq: Once | INTRAVENOUS | Status: AC | PRN
  Administered 2013-12-29: 14 mL via INTRAVENOUS

## 2014-01-01 ENCOUNTER — Other Ambulatory Visit: Payer: Self-pay | Admitting: Oncology

## 2014-01-02 ENCOUNTER — Telehealth (INDEPENDENT_AMBULATORY_CARE_PROVIDER_SITE_OTHER): Payer: Self-pay

## 2014-01-02 ENCOUNTER — Telehealth: Payer: Self-pay | Admitting: *Deleted

## 2014-01-02 NOTE — Telephone Encounter (Signed)
Faxed Care Plan and office notes to Jessica at [facility] & to the PCP.  Took Care Plan to Med Rec to scan.  

## 2014-01-02 NOTE — Telephone Encounter (Signed)
I called and spoke to [place] at [facility] to verify when the patient's biopsy was.  She is scheduled for tomorrow for [place] and US guided bx.  I will let Dr Dalbert Batman know.

## 2014-01-03 ENCOUNTER — Other Ambulatory Visit: Payer: Self-pay | Admitting: Oncology

## 2014-01-04 ENCOUNTER — Other Ambulatory Visit (INDEPENDENT_AMBULATORY_CARE_PROVIDER_SITE_OTHER): Payer: Self-pay | Admitting: General Surgery

## 2014-01-04 ENCOUNTER — Other Ambulatory Visit: Payer: Self-pay | Admitting: Oncology

## 2014-01-04 DIAGNOSIS — R928 Other abnormal and inconclusive findings on diagnostic imaging of breast: Secondary | ICD-10-CM

## 2014-01-09 ENCOUNTER — Encounter (HOSPITAL_BASED_OUTPATIENT_CLINIC_OR_DEPARTMENT_OTHER): Payer: Self-pay | Admitting: *Deleted

## 2014-01-09 NOTE — Progress Notes (Signed)
Pt to go get cxr per dr Tawni Levy having another br bx 01/11/14-may have to have 2 nl Labs done 12/21/13-

## 2014-01-11 ENCOUNTER — Ambulatory Visit
Admission: RE | Admit: 2014-01-11 | Discharge: 2014-01-11 | Disposition: A | Discharge status: Home / Self Care | Payer: BC Managed Care – PPO | Source: Ambulatory Visit | Attending: General Surgery | Admitting: General Surgery

## 2014-01-11 ENCOUNTER — Other Ambulatory Visit (HOSPITAL_COMMUNITY): Payer: Self-pay | Admitting: Diagnostic Radiology

## 2014-01-11 DIAGNOSIS — R928 Other abnormal and inconclusive findings on diagnostic imaging of breast: Secondary | ICD-10-CM

## 2014-01-11 MED ORDER — GADOBENATE DIMEGLUMINE 529 MG/ML IV SOLN
14.0000 mL | Freq: Once | INTRAVENOUS | Status: AC | PRN
  Administered 2014-01-11: 14 mL via INTRAVENOUS

## 2014-01-13 NOTE — H&P (Signed)
Sheri Arellano   MRN:  [id number]   Description: 63 year old female  Provider: [name], MD  Department: [facility]         Diagnoses      Breast cancer of upper-outer quadrant of left female breast    -  Primary      174.4           History and Physical     [name], MD      Status: Addendum            Patient ID: Sheri Arellano, female   DOB: 1951/08/23, 63 y.o.   MRN: [id number]       HPI Sheri Arellano is a 63 y.o. female.  She is referred by Dr. Gabriel Rainwater at [facility] mammography for evaluation and management of a newly diagnosed invasive cancer of the left breast, upper outer quadrant, triple negative breast cancer. Dr. Mertha Finders is her PCP.  She is being evaluated in the [facility] today by Dr. Duke Salvia, Dr. Jana Hakim, and me. She is a friend of International aid/development worker at [place].   She gets yearly mammograms. Recent mammogram showed a 4 mm density in the left breast, upper outer quadrant, less than 1 cm from the skin. Image guided biopsy shows invasive mammary carcinoma, probably invasive ductal carcinoma. T. 6772%. Triple negative breast cancer. She has not had an MRI.   She states that her family wants her to have bilateral mastectomies. We all spent a long time discussing this. She thought a mastectomy would prevent her from having chemotherapy, but now she understands that that is not necessarily so. She has been advised that mastectomy does not confer a survival benefit, although it does confirm a reduced  recurrence incidence.   Family history reveals breast cancer in a maternal and. Otherwise family history is negative.   Comorbidities include past history of supraventricular tachycardia, anxiety, GERD, cholecystectomy..        Past Medical History   Diagnosis  Date   .  Esophageal dysmotility     .  Anxiety         probable panic attacks   .  Insomnia     .  Iron deficiency anemia     .  GERD (gastroesophageal reflux disease)     .   Hyperlipidemia     .  SVT (supraventricular tachycardia)         documented   .  Dysphagia     .  Vitamin D deficiency     .  Other malaise and fatigue           Past Surgical History   Procedure  Laterality  Date   .  Carpal tunnel release       .  Cholecystectomy    1999         Family History   Problem  Relation  Age of Onset   .  Atrial fibrillation  Mother     .  Hyperlipidemia  Mother     .  Hypertension  Mother     .  Cancer  Father         bladder   .  Cancer  Maternal Aunt         breast        Social History History   Substance Use Topics   .  Smoking status:  Former Smoker       Quit date:  01/22/1976   .  Smokeless tobacco:  Former Systems developer       Quit date:  02/16/1975   .  Alcohol Use:  0.6 oz/week       1 Glasses of wine per week         Comment: rarely         Allergies   Allergen  Reactions   .  Erythromycin Base  Other (See Comments)       Stomach ache         Current Outpatient Prescriptions   Medication  Sig  Dispense  Refill   .  aspirin EC 81 MG tablet  Take 81 mg by mouth daily.         .  Cholecalciferol (VITAMIN D) 2000 UNITS tablet  Take 2,000 Units by mouth daily.         .  clonazePAM (KLONOPIN) 1 MG tablet  Take 1 mg by mouth at bedtime as needed (sleep).          .  Fiber-Vitamins-Minerals (FIBERALL PO)  Take 2 tablets by mouth every evening.          .  loratadine (CLARITIN) 10 MG tablet  Take 10 mg by mouth daily.         .  Multiple Vitamin (MULTIVITAMIN WITH MINERALS) TABS tablet  Take 1 tablet by mouth daily.         .  nitroGLYCERIN (NITROSTAT) 0.4 MG SL tablet  Place 0.4 mg under the tongue every 5 (five) minutes as needed for chest pain.          .  pantoprazole (PROTONIX) 40 MG tablet  Take 40 mg by mouth daily.         .  sertraline (ZOLOFT) 100 MG tablet  Take 50 mg by mouth daily. Takes 1/2 daily         .  simvastatin (ZOCOR) 40 MG tablet  Take 20 mg by mouth at bedtime. Takes 1/2 daily                   Review of Systems Review of Systems  Cardiovascular: Positive for palpitations.  Psychiatric/Behavioral: The patient is nervous/anxious.    t.   Physical Exam   Constitutional: She is oriented to person, place, and time. She appears well-developed and well-nourished. No distress.  HENT:   Head: Normocephalic and atraumatic.   Nose: Nose normal.   Mouth/Throat: No oropharyngeal exudate.  Eyes: Conjunctivae and EOM are normal. Pupils are equal, round, and reactive to light. Left eye exhibits no discharge. No scleral icterus.  Neck: Neck supple. No JVD present. No tracheal deviation present. No thyromegaly present.  Cardiovascular: Normal rate, regular rhythm, normal heart sounds and intact distal pulses.    No murmur heard. Pulmonary/Chest: Effort normal and breath sounds normal. No respiratory distress. She has no wheezes. She has no rales. She exhibits no tenderness.  Bruise and a tiny hematoma, upper outer quadrant, left breast. No other abnormality in either breast. No axillary adenopathy on either side.  Abdominal: Soft. Bowel sounds are normal. She exhibits no distension and no mass. There is no tenderness. There is no rebound and no guarding.  Well-healed laparoscopic scars from cholecystectomy  Musculoskeletal: She exhibits no edema and no tenderness.  Lymphadenopathy:    She has no cervical adenopathy.  Neurological: She is alert and oriented to person, place, and time. She exhibits normal muscle tone. Coordination normal.  Skin: Skin is warm. No rash  noted. She is not diaphoretic. No erythema. No pallor.  Psychiatric: She has a normal mood and affect. Her behavior is normal. Judgment and thought content normal.      Data Reviewed I have reviewed her imaging studies, histology, and I have coordinated her treatment plan with Dr. Sondra Come and Dr. Jana Hakim. I have discussed her treatment plan in breast conference this morning.   Assessment:    Invasive  adenocarcinoma left breast, upper outer quadrant, 4 mm diameter, triple negative breast cancer. She is a good candidate for breast conservation surgery, although she is not certain that that is her decision at this time. She is still contemplating whether she wants bilateral mastectomies or not.   If we do perform a left partial mastectomy, I am considering taking an ellipse of skin because the tumor is so close to the surface   She is not a candidate for MammoSite because the tumor is so close to the skin   History supraventricular tachycardia   Chronic anxiety   GERD      Plan    I had a very long talk with the patient and her family. We talked about all the surgical options including breast conservation, mastectomy, with or without plastic surgical reconstruction. I told her that my bias was to perform a left partial mastectomy with sentinel node biopsy because the tumor is so small. She understands these issues.    She will be scheduled for bilateral breast MRI because she is triple negative breast cancer.She understands that if the MRI shows other areas of suspicious enhancement, we would need to postpone surgery until she had further evaluation and possible further biopsies.   ADDENDUM:   MRI shows the biopsy-proven cancer in the upper outer quadrant. There is a second area, a 1 cm retroareolar mass that warrants biopsy. This was discussed with the patient. She is scheduled for second look ultrasound and and guided biopsy at [facility] on 01/03/2014.  ADDENDUM(01/13/2014):  MRI guided biopsy of the second area in  central breast showed benign breast tissue.   Dr. Jana Hakim does not want to put a Port-A-Cath in until we see what the final pathology is. If the tumor is greater than 5 mm then we may need to put a Port-A-Cath for chemotherapy at a later date. The patient is aware of this.   After numerous discussions with Dr. Jana Hakim and I, she has decided she would like breast  conservation. She will be scheduled for that surgery.Edsel Petrin. Dalbert Batman, M.D., [facility] General and Minimally invasive Surgery Breast and Colorectal Surgery Office:   [phone] Pager:   [phone]

## 2014-01-16 ENCOUNTER — Encounter (HOSPITAL_BASED_OUTPATIENT_CLINIC_OR_DEPARTMENT_OTHER): Payer: Self-pay | Admitting: Anesthesiology

## 2014-01-16 ENCOUNTER — Encounter (HOSPITAL_BASED_OUTPATIENT_CLINIC_OR_DEPARTMENT_OTHER): Payer: BC Managed Care – PPO | Admitting: Anesthesiology

## 2014-01-16 ENCOUNTER — Encounter (HOSPITAL_COMMUNITY)
Admission: RE | Admit: 2014-01-16 | Discharge: 2014-01-16 | Disposition: A | Discharge status: Home / Self Care | Payer: BC Managed Care – PPO | Source: Ambulatory Visit | Attending: General Surgery | Admitting: General Surgery

## 2014-01-16 ENCOUNTER — Encounter (HOSPITAL_BASED_OUTPATIENT_CLINIC_OR_DEPARTMENT_OTHER)
Admission: RE | Disposition: A | Discharge status: Home / Self Care | Payer: Self-pay | Source: Ambulatory Visit | Attending: General Surgery

## 2014-01-16 ENCOUNTER — Ambulatory Visit (HOSPITAL_BASED_OUTPATIENT_CLINIC_OR_DEPARTMENT_OTHER): Payer: BC Managed Care – PPO | Admitting: Anesthesiology

## 2014-01-16 ENCOUNTER — Ambulatory Visit (HOSPITAL_BASED_OUTPATIENT_CLINIC_OR_DEPARTMENT_OTHER)
Admission: RE | Admit: 2014-01-16 | Discharge: 2014-01-16 | Disposition: A | Discharge status: Home / Self Care | Payer: BC Managed Care – PPO | Source: Ambulatory Visit | Attending: General Surgery | Admitting: General Surgery

## 2014-01-16 DIAGNOSIS — C50412 Malignant neoplasm of upper-outer quadrant of left female breast: Secondary | ICD-10-CM | POA: Diagnosis present

## 2014-01-16 DIAGNOSIS — K219 Gastro-esophageal reflux disease without esophagitis: Secondary | ICD-10-CM | POA: Insufficient documentation

## 2014-01-16 DIAGNOSIS — E559 Vitamin D deficiency, unspecified: Secondary | ICD-10-CM | POA: Insufficient documentation

## 2014-01-16 DIAGNOSIS — D509 Iron deficiency anemia, unspecified: Secondary | ICD-10-CM | POA: Insufficient documentation

## 2014-01-16 DIAGNOSIS — C50919 Malignant neoplasm of unspecified site of unspecified female breast: Secondary | ICD-10-CM

## 2014-01-16 DIAGNOSIS — Z7982 Long term (current) use of aspirin: Secondary | ICD-10-CM | POA: Insufficient documentation

## 2014-01-16 DIAGNOSIS — E785 Hyperlipidemia, unspecified: Secondary | ICD-10-CM | POA: Insufficient documentation

## 2014-01-16 DIAGNOSIS — Z171 Estrogen receptor negative status [ER-]: Secondary | ICD-10-CM | POA: Diagnosis present

## 2014-01-16 DIAGNOSIS — C50419 Malignant neoplasm of upper-outer quadrant of unspecified female breast: Secondary | ICD-10-CM | POA: Insufficient documentation

## 2014-01-16 DIAGNOSIS — R002 Palpitations: Secondary | ICD-10-CM | POA: Insufficient documentation

## 2014-01-16 DIAGNOSIS — C50912 Malignant neoplasm of unspecified site of left female breast: Secondary | ICD-10-CM

## 2014-01-16 DIAGNOSIS — Z87891 Personal history of nicotine dependence: Secondary | ICD-10-CM | POA: Insufficient documentation

## 2014-01-16 DIAGNOSIS — F411 Generalized anxiety disorder: Secondary | ICD-10-CM | POA: Insufficient documentation

## 2014-01-16 HISTORY — PX: PARTIAL MASTECTOMY WITH NEEDLE LOCALIZATION AND AXILLARY SENTINEL LYMPH NODE BX: SHX6009

## 2014-01-16 SURGERY — PARTIAL MASTECTOMY WITH NEEDLE LOCALIZATION AND AXILLARY SENTINEL LYMPH NODE BX
Anesthesia: General | Site: Breast | Laterality: Left

## 2014-01-16 MED ORDER — BUPIVACAINE-EPINEPHRINE 0.5% -1:200000 IJ SOLN
INTRAMUSCULAR | Status: DC | PRN
  Administered 2014-01-16: 15 mL

## 2014-01-16 MED ORDER — CEFAZOLIN SODIUM-DEXTROSE 2-3 GM-% IV SOLR
2.0000 g | INTRAVENOUS | Status: AC
  Administered 2014-01-16: 2 g via INTRAVENOUS

## 2014-01-16 MED ORDER — ONDANSETRON HCL 4 MG/2ML IJ SOLN
INTRAMUSCULAR | Status: DC | PRN
  Administered 2014-01-16: 4 mg via INTRAVENOUS

## 2014-01-16 MED ORDER — SUCCINYLCHOLINE CHLORIDE 20 MG/ML IJ SOLN
INTRAMUSCULAR | Status: AC
  Filled 2014-01-16: qty 1

## 2014-01-16 MED ORDER — LACTATED RINGERS IV SOLN
INTRAVENOUS | Status: DC
  Administered 2014-01-16 (×2): via INTRAVENOUS

## 2014-01-16 MED ORDER — HYDROMORPHONE HCL PF 1 MG/ML IJ SOLN
INTRAMUSCULAR | Status: AC
  Filled 2014-01-16: qty 1

## 2014-01-16 MED ORDER — PROPOFOL 10 MG/ML IV BOLUS
INTRAVENOUS | Status: AC
  Filled 2014-01-16: qty 20

## 2014-01-16 MED ORDER — MIDAZOLAM HCL 2 MG/2ML IJ SOLN
INTRAMUSCULAR | Status: AC
  Filled 2014-01-16: qty 2

## 2014-01-16 MED ORDER — METHYLENE BLUE 1 % INJ SOLN
INTRAMUSCULAR | Status: AC
  Filled 2014-01-16: qty 10

## 2014-01-16 MED ORDER — FENTANYL CITRATE 0.05 MG/ML IJ SOLN
INTRAMUSCULAR | Status: AC
  Filled 2014-01-16: qty 2

## 2014-01-16 MED ORDER — DEXAMETHASONE SODIUM PHOSPHATE 4 MG/ML IJ SOLN
INTRAMUSCULAR | Status: DC | PRN
  Administered 2014-01-16: 10 mg via INTRAVENOUS

## 2014-01-16 MED ORDER — FENTANYL CITRATE 0.05 MG/ML IJ SOLN
INTRAMUSCULAR | Status: DC | PRN
  Administered 2014-01-16: 100 ug via INTRAVENOUS

## 2014-01-16 MED ORDER — MIDAZOLAM HCL 2 MG/2ML IJ SOLN
1.0000 mg | INTRAMUSCULAR | Status: DC | PRN
Start: 2014-01-16 — End: 2014-01-16
  Administered 2014-01-16: 2 mg via INTRAVENOUS

## 2014-01-16 MED ORDER — CHLORHEXIDINE GLUCONATE 4 % EX LIQD: 1.0000 "application " | Freq: Once | CUTANEOUS | Status: DC

## 2014-01-16 MED ORDER — EPHEDRINE SULFATE 50 MG/ML IJ SOLN
INTRAMUSCULAR | Status: DC | PRN
  Administered 2014-01-16: 10 mg via INTRAVENOUS

## 2014-01-16 MED ORDER — MIDAZOLAM HCL 5 MG/5ML IJ SOLN
INTRAMUSCULAR | Status: DC | PRN
  Administered 2014-01-16: 2 mg via INTRAVENOUS

## 2014-01-16 MED ORDER — LIDOCAINE HCL (CARDIAC) 20 MG/ML IV SOLN
INTRAVENOUS | Status: DC | PRN
  Administered 2014-01-16: 80 mg via INTRAVENOUS

## 2014-01-16 MED ORDER — SODIUM CHLORIDE 0.9 % IJ SOLN
INTRAMUSCULAR | Status: DC | PRN
  Administered 2014-01-16: 11:00:00

## 2014-01-16 MED ORDER — BUPIVACAINE-EPINEPHRINE PF 0.5-1:200000 % IJ SOLN
INTRAMUSCULAR | Status: AC
  Filled 2014-01-16: qty 30

## 2014-01-16 MED ORDER — OXYCODONE HCL 5 MG PO TABS: 5.0000 mg | ORAL_TABLET | Freq: Once | ORAL | Status: DC | PRN

## 2014-01-16 MED ORDER — SODIUM CHLORIDE 0.9 % IJ SOLN
INTRAMUSCULAR | Status: AC
  Filled 2014-01-16: qty 10

## 2014-01-16 MED ORDER — FENTANYL CITRATE 0.05 MG/ML IJ SOLN
INTRAMUSCULAR | Status: AC
Start: 2014-01-16 — End: 2014-01-16
  Filled 2014-01-16: qty 4

## 2014-01-16 MED ORDER — TECHNETIUM TC 99M SULFUR COLLOID FILTERED: 1.0000 | Freq: Once | INTRAVENOUS | Status: AC | PRN

## 2014-01-16 MED ORDER — ONDANSETRON HCL 4 MG/2ML IJ SOLN: 4.0000 mg | Freq: Once | INTRAMUSCULAR | Status: DC | PRN

## 2014-01-16 MED ORDER — PROPOFOL 10 MG/ML IV BOLUS
INTRAVENOUS | Status: DC | PRN
  Administered 2014-01-16: 200 mg via INTRAVENOUS

## 2014-01-16 MED ORDER — HYDROMORPHONE HCL PF 1 MG/ML IJ SOLN
0.2500 mg | INTRAMUSCULAR | Status: DC | PRN
  Administered 2014-01-16: 0.25 mg via INTRAVENOUS
  Administered 2014-01-16: 0.5 mg via INTRAVENOUS
  Administered 2014-01-16: 0.25 mg via INTRAVENOUS

## 2014-01-16 MED ORDER — HYDROCODONE-ACETAMINOPHEN 5-325 MG PO TABS: 1.0000 | ORAL_TABLET | ORAL | Status: DC | PRN

## 2014-01-16 MED ORDER — FENTANYL CITRATE 0.05 MG/ML IJ SOLN
50.0000 ug | INTRAMUSCULAR | Status: DC | PRN
  Administered 2014-01-16: 100 ug via INTRAVENOUS

## 2014-01-16 MED ORDER — OXYCODONE HCL 5 MG/5ML PO SOLN: 5.0000 mg | Freq: Once | ORAL | Status: DC | PRN

## 2014-01-16 SURGICAL SUPPLY — 68 items
ADH SKN CLS APL DERMABOND .7 (GAUZE/BANDAGES/DRESSINGS) ×1
APL SKNCLS STERI-STRIP NONHPOA (GAUZE/BANDAGES/DRESSINGS)
APPLIER CLIP 11 MED OPEN (CLIP) ×2
APR CLP MED 11 20 MLT OPN (CLIP) ×1
BANDAGE ELASTIC 6 VELCRO ST LF (GAUZE/BANDAGES/DRESSINGS) IMPLANT
BENZOIN TINCTURE PRP APPL 2/3 (GAUZE/BANDAGES/DRESSINGS) IMPLANT
BINDER BREAST XLRG (GAUZE/BANDAGES/DRESSINGS) ×1 IMPLANT
BLADE HEX COATED 2.75 (ELECTRODE) ×2 IMPLANT
BLADE SURG 10 STRL SS (BLADE) IMPLANT
BLADE SURG 15 STRL LF DISP TIS (BLADE) ×2 IMPLANT
BLADE SURG 15 STRL SS (BLADE) ×4
CANISTER SUCT 1200ML W/VALVE (MISCELLANEOUS) ×2 IMPLANT
CHLORAPREP W/TINT 26ML (MISCELLANEOUS) ×2 IMPLANT
CLIP APPLIE 11 MED OPEN (CLIP) ×1 IMPLANT
COVER MAYO STAND STRL (DRAPES) ×2 IMPLANT
COVER PROBE W GEL 5X96 (DRAPES) ×2 IMPLANT
COVER TABLE BACK 60X90 (DRAPES) ×2 IMPLANT
DECANTER SPIKE VIAL GLASS SM (MISCELLANEOUS) IMPLANT
DERMABOND ADVANCED (GAUZE/BANDAGES/DRESSINGS) ×1
DERMABOND ADVANCED .7 DNX12 (GAUZE/BANDAGES/DRESSINGS) IMPLANT
DEVICE DUBIN W/COMP PLATE 8390 (MISCELLANEOUS) ×2 IMPLANT
DRAIN CHANNEL 19F RND (DRAIN) IMPLANT
DRAIN HEMOVAC 1/8 X 5 (WOUND CARE) IMPLANT
DRAPE LAPAROSCOPIC ABDOMINAL (DRAPES) ×2 IMPLANT
DRAPE UTILITY XL STRL (DRAPES) ×3 IMPLANT
ELECT REM PT RETURN 9FT ADLT (ELECTROSURGICAL) ×2
ELECTRODE REM PT RTRN 9FT ADLT (ELECTROSURGICAL) ×1 IMPLANT
EVACUATOR SILICONE 100CC (DRAIN) IMPLANT
GAUZE SPONGE 4X4 16PLY XRAY LF (GAUZE/BANDAGES/DRESSINGS) IMPLANT
GLOVE BIOGEL M 7.0 STRL (GLOVE) ×1 IMPLANT
GLOVE BIOGEL PI IND STRL 7.5 (GLOVE) IMPLANT
GLOVE BIOGEL PI INDICATOR 7.5 (GLOVE) ×1
GLOVE EUDERMIC 7 POWDERFREE (GLOVE) ×2 IMPLANT
GOWN STRL REUS W/ TWL LRG LVL3 (GOWN DISPOSABLE) ×1 IMPLANT
GOWN STRL REUS W/ TWL XL LVL3 (GOWN DISPOSABLE) ×1 IMPLANT
GOWN STRL REUS W/TWL LRG LVL3 (GOWN DISPOSABLE) ×2
GOWN STRL REUS W/TWL XL LVL3 (GOWN DISPOSABLE) ×2
KIT MARKER MARGIN INK (KITS) ×2 IMPLANT
NDL HYPO 25X1 1.5 SAFETY (NEEDLE) ×2 IMPLANT
NDL SAFETY ECLIPSE 18X1.5 (NEEDLE) ×1 IMPLANT
NEEDLE HYPO 18GX1.5 SHARP (NEEDLE) ×2
NEEDLE HYPO 25X1 1.5 SAFETY (NEEDLE) ×6 IMPLANT
NS IRRIG 1000ML POUR BTL (IV SOLUTION) ×2 IMPLANT
PACK BASIN DAY SURGERY FS (CUSTOM PROCEDURE TRAY) ×2 IMPLANT
PAD ABD 8X10 STRL (GAUZE/BANDAGES/DRESSINGS) ×1 IMPLANT
PAD ALCOHOL SWAB (MISCELLANEOUS) ×2 IMPLANT
PENCIL BUTTON HOLSTER BLD 10FT (ELECTRODE) ×2 IMPLANT
PIN SAFETY STERILE (MISCELLANEOUS) IMPLANT
SHEET MEDIUM DRAPE 40X70 STRL (DRAPES) ×1 IMPLANT
SLEEVE SCD COMPRESS KNEE MED (MISCELLANEOUS) ×1 IMPLANT
SPONGE GAUZE 4X4 12PLY (GAUZE/BANDAGES/DRESSINGS) ×3 IMPLANT
SPONGE GAUZE 4X4 12PLY STER LF (GAUZE/BANDAGES/DRESSINGS) IMPLANT
SPONGE LAP 18X18 X RAY DECT (DISPOSABLE) IMPLANT
SPONGE LAP 4X18 X RAY DECT (DISPOSABLE) ×2 IMPLANT
STRIP CLOSURE SKIN 1/2X4 (GAUZE/BANDAGES/DRESSINGS) IMPLANT
SUT ETHILON 3 0 FSL (SUTURE) IMPLANT
SUT MNCRL AB 4-0 PS2 18 (SUTURE) ×4 IMPLANT
SUT SILK 2 0 SH (SUTURE) ×2 IMPLANT
SUT VIC AB 2-0 CT1 27 (SUTURE)
SUT VIC AB 2-0 CT1 TAPERPNT 27 (SUTURE) IMPLANT
SUT VIC AB 3-0 SH 27 (SUTURE)
SUT VIC AB 3-0 SH 27X BRD (SUTURE) IMPLANT
SUT VICRYL 3-0 CR8 SH (SUTURE) ×2 IMPLANT
SYR CONTROL 10ML LL (SYRINGE) ×4 IMPLANT
TOWEL OR 17X24 6PK STRL BLUE (TOWEL DISPOSABLE) ×3 IMPLANT
TOWEL OR NON WOVEN STRL DISP B (DISPOSABLE) ×2 IMPLANT
TUBE CONNECTING 20X1/4 (TUBING) ×2 IMPLANT
YANKAUER SUCT BULB TIP NO VENT (SUCTIONS) ×2 IMPLANT

## 2014-01-16 NOTE — Discharge Instructions (Signed)
[facility] °Office Phone Number [phone] ° °BREAST BIOPSY/ PARTIAL MASTECTOMY: POST OP INSTRUCTIONS ° °Always review your discharge instruction sheet given to you by the facility where your surgery was performed. ° °IF YOU HAVE DISABILITY OR FAMILY LEAVE FORMS, YOU MUST BRING THEM TO THE OFFICE FOR PROCESSING.  DO NOT GIVE THEM TO YOUR DOCTOR. ° °1. A prescription for pain medication may be given to you upon discharge.  Take your pain medication as prescribed, if needed.  If narcotic pain medicine is not needed, then you may take acetaminophen (Tylenol) or ibuprofen (Advil) as needed. °2. Take your usually prescribed medications unless otherwise directed °3. If you need a refill on your pain medication, please contact your pharmacy.  They will contact our office to request authorization.  Prescriptions will not be filled after 5pm or on week-ends. °4. You should eat very light the first 24 hours after surgery, such as soup, crackers, pudding, etc.  Resume your normal diet the day after surgery. °5. Most patients will experience some swelling and bruising in the breast.  Ice packs and a good support bra will help.  Swelling and bruising can take several days to resolve.  °6. It is common to experience some constipation if taking pain medication after surgery.  Increasing fluid intake and taking a stool softener will usually help or prevent this problem from occurring.  A mild laxative (Milk of Magnesia or Miralax) should be taken according to package directions if there are no bowel movements after 48 hours. °7. Unless discharge instructions indicate otherwise, you may remove your bandages 24-48 hours after surgery, and you may shower at that time.  You may have steri-strips (small skin tapes) in place directly over the incision.  These strips should be left on the skin for 7-10 days.  If your surgeon used skin glue on the incision, you may shower in 24 hours.  The glue will flake off over the  next 2-3 weeks.  Any sutures or staples will be removed at the office during your follow-up visit. °8. ACTIVITIES:  You may resume regular daily activities (gradually increasing) beginning the next day.  Wearing a good support bra or sports bra minimizes pain and swelling.  You may have sexual intercourse when it is comfortable. °a. You may drive when you no longer are taking prescription pain medication, you can comfortably wear a seatbelt, and you can safely maneuver your car and apply brakes. °b. RETURN TO WORK:  ______________________________________________________________________________________ °9. You should see your doctor in the office for a follow-up appointment approximately two weeks after your surgery.  Your doctor’s nurse will typically make your follow-up appointment when she calls you with your pathology report.  Expect your pathology report 2-3 business days after your surgery.  You may call to check if you do not hear from us after three days. °10. OTHER INSTRUCTIONS: _______________________________________________________________________________________________ _____________________________________________________________________________________________________________________________________ °_____________________________________________________________________________________________________________________________________ °_____________________________________________________________________________________________________________________________________ ° °WHEN TO CALL YOUR DOCTOR: °1. Fever over 101.0 °2. Nausea and/or vomiting. °3. Extreme swelling or bruising. °4. Continued bleeding from incision. °5. Increased pain, redness, or drainage from the incision. ° °The clinic staff is available to answer your questions during regular business hours.  Please don’t hesitate to call and ask to speak to one of the nurses for clinical concerns.  If you have a medical emergency, go to the nearest  emergency room or call 911.  A surgeon from [facility] is always on call at the hospital. ° °For further questions, please visit [email or web address]  ° ° °  Post Anesthesia Home Care Instructions ° °Activity: °Get plenty of rest for the remainder of the day. A responsible adult should stay with you for 24 hours following the procedure.  °For the next 24 hours, DO NOT: °-Drive a car °-Operate machinery °-Drink alcoholic beverages °-Take any medication unless instructed by your physician °-Make any legal decisions or sign important papers. ° °Meals: °Start with liquid foods such as gelatin or soup. Progress to regular foods as tolerated. Avoid greasy, spicy, heavy foods. If nausea and/or vomiting occur, drink only clear liquids until the nausea and/or vomiting subsides. Call your physician if vomiting continues. ° °Special Instructions/Symptoms: °Your throat may feel dry or sore from the anesthesia or the breathing tube placed in your throat during surgery. If this causes discomfort, gargle with warm salt water. The discomfort should disappear within 24 hours. ° °

## 2014-01-16 NOTE — Anesthesia Preprocedure Evaluation (Signed)
Anesthesia Evaluation  Patient identified by MRN, date of birth, ID band Patient awake    Reviewed: Allergy & Precautions, H&P , NPO status , Patient's Chart, lab work & pertinent test results  Airway Mallampati: I TM Distance: >3 FB Neck ROM: Full    Dental  (+) Teeth Intact and Dental Advisory Given   Pulmonary former smoker,  breath sounds clear to auscultation        Cardiovascular Rhythm:Regular Rate:Normal     Neuro/Psych    GI/Hepatic GERD-  Medicated and Controlled,  Endo/Other    Renal/GU      Musculoskeletal   Abdominal   Peds  Hematology   Anesthesia Other Findings   Reproductive/Obstetrics                           Anesthesia Physical Anesthesia Plan  ASA: II  Anesthesia Plan: General   Post-op Pain Management:    Induction: Intravenous  Airway Management Planned:   Additional Equipment:   Intra-op Plan:   Post-operative Plan: Extubation in OR  Informed Consent: I have reviewed the patients History and Physical, chart, labs and discussed the procedure including the risks, benefits and alternatives for the proposed anesthesia with the patient or authorized representative who has indicated his/her understanding and acceptance.   Dental advisory given  Plan Discussed with: CRNA, Anesthesiologist and Surgeon  Anesthesia Plan Comments:         Anesthesia Quick Evaluation

## 2014-01-16 NOTE — Interval H&P Note (Signed)
History and Physical Interval Note:  01/16/2014 10:07 AM  Sheri Arellano  has presented today for surgery, with the diagnosis of cancer left breast  The goals and the various methods of treatment have been discussed with the patient and family. After consideration of risks, benefits and other options for treatment, the patient has consented to  Procedure(s): PARTIAL MASTECTOMY WITH NEEDLE LOCALIZATION AND AXILLARY SENTINEL LYMPH NODE BX (Left) as a surgical intervention .  The patient's history has been reviewed, patient examined today, no change in status, stable for surgery.  I have reviewed the patient's chart and labs.  Questions were answered to the patient's satisfaction.     [name]

## 2014-01-16 NOTE — Op Note (Signed)
Patient Name:           Sheri Arellano   Date of Surgery:        01/16/2014  Pre op Diagnosis:      Invasive adenocarcinoma left breast, upper outer quadrant, 4 mm diameter, triple negative breast cancer.    Post op Diagnosis:    Same, clinical stage T1b, N0  Procedure:                 Inject blue dye left breast Left partial mastectomy with needle localization Left axillary sentinel node biopsy  Surgeon:                     Edsel Petrin. Dalbert Batman, M.D., FACS  Assistant:                      None  Operative Indications:   Sheri Arellano is a 63 y.o. female. She is referred by Dr. Christene Slates at [facility] mammography for evaluation and management of a newly diagnosed invasive cancer of the left breast, upper outer quadrant, triple negative breast cancer. Dr. Mertha Finders is her PCP. She was  evaluated in the [facility]  by Dr. Sondra Come, Dr. Jana Hakim, and me. She gets yearly mammograms. Recent mammogram showed a 4 mm density in the left breast, upper outer quadrant, less than 1 cm from the skin. Image guided biopsy shows invasive mammary carcinoma, probably invasive ductal carcinoma. Ki-67 72%. Triple negative breast cancer. MRI showed a second density centrally within the left breast Second Look ultrasound could not visualize patent. MRI guided biopsy of the retroareolar density was performed last week and shows only benign findings, thought to be concordant. She is brought to the operating room for left partial mastectomy and sentinel node biopsy.  Family history reveals breast cancer in a maternal aunt. Otherwise family history is negative.  Comorbidities include past history of supraventricular tachycardia, anxiety, GERD, cholecystectomy.Marland Kitchen   Operative Findings:       The wire was placed from a lateral to medial position and went directly adjacent to the marker clip. The specimen mammogram showed that the wire in the marker clip were in the center of the specimen, and so we felt that we had good margins. I  found two axillary sentinel nodes.  Procedure in Detail:          The patient underwent wire localization by Dr. Marcelo Baldy at [facility] mammography and the wire was well placed. The patient underwent injection of radionuclide in the left breast in the holding area at [facility] surgery center. She was taken to the operating room where general anesthesia was induced. Surgical time out was performed. Following alcohol prep I injected 5 cc of blue dye into the left breast, subareolar area. This was 2 cc of methylene blue mixed with 3 cc of saline. The breast was massaged for a few minutes.      The left breast and left axilla were then prepped and draped in a sterile fashion. Another surgical time out was performed. 0.5% Marcaine with epinephrine was used as local infiltration anesthetic. I marked a transverse elliptical incision to encompass the insertion site of the wire and the "X"  marked on the skin overlying the tumor. I did this because the tumor was so superficial. This was a radially oriented transverse incision at the 3:30 position of the left breast. I made the incision. Dissection was carried down into the breast tissue around the localizing wire.  Specimen was removed and marked with silk sutures and the 6 color ink Kit.Marland Kitchen Specimen mammogram looked good as described above. The specimen was marked and sent to pathology. The wound was irrigated with saline. Hemostasis was adequate. The lumpectomy cavity was marked with metal clips. I closed the breast tissues in layers with interrupted sutures of 3-0 Vicryl and the skin was closed with a running subcuticular suture of 4-0 Monocryl and Dermabond.      I then made a transverse incision in the left axilla at the hairline. Dissection was carried down through the subcutaneous tissue. The clavipectoral fascia was incised. Using the neoprobe I found 2 very hot lymph nodes. One of these was slightly enlarged and very blue and the other one was only slightly blue. Once I  removed these 2 sentinel lymph nodes there was no more radioactivity in the axilla. This wound was irrigated with saline. Hemostasis was excellent and achieved with electrocautery. The deeper tissues were closed with 3-0 Vicryl suture the skin closed with a running subcuticular suture of 4-0 Monocryl and Dermabond. Breast binder was placed. The patient tolerated the procedure well was taken to recovery room stable condition. EBL 20 cc or less. Counts correct. Complications none.     Edsel Petrin. Dalbert Batman, M.D., FACS General and Minimally Invasive Surgery Breast and Colorectal Surgery  01/16/2014 11:54 AM

## 2014-01-16 NOTE — Transfer of Care (Signed)
Immediate Anesthesia Transfer of Care Note  Patient: Sheri Arellano  Procedure(s) Performed: Procedure(s): PARTIAL MASTECTOMY WITH NEEDLE LOCALIZATION AND AXILLARY SENTINEL LYMPH NODE BIOPSY (Left)  Patient Location: PACU  Anesthesia Type:General  Level of Consciousness: sedated  Airway & Oxygen Therapy: Patient Spontanous Breathing and Patient connected to face mask oxygen  Post-op Assessment: Report given to PACU RN and Post -op Vital signs reviewed and stable  Post vital signs: Reviewed and stable  Complications: No apparent anesthesia complications

## 2014-01-16 NOTE — Anesthesia Procedure Notes (Signed)
Procedure Name: LMA Insertion Date/Time: 01/16/2014 10:52 AM Performed by: Maryella Shivers Pre-anesthesia Checklist: Patient identified, Emergency Drugs available, Suction available and Patient being monitored Patient Re-evaluated:Patient Re-evaluated prior to inductionOxygen Delivery Method: Circle System Utilized Preoxygenation: Pre-oxygenation with 100% oxygen Intubation Type: IV induction Ventilation: Mask ventilation without difficulty LMA: LMA inserted LMA Size: 4.0 Number of attempts: 1 Airway Equipment and Method: bite block Placement Confirmation: positive ETCO2 Tube secured with: Tape Dental Injury: Teeth and Oropharynx as per pre-operative assessment

## 2014-01-16 NOTE — Anesthesia Postprocedure Evaluation (Signed)
  Anesthesia Post-op Note  Patient: Sheri Arellano  Procedure(s) Performed: Procedure(s): PARTIAL MASTECTOMY WITH NEEDLE LOCALIZATION AND AXILLARY SENTINEL LYMPH NODE BIOPSY (Left)  Patient Location: PACU  Anesthesia Type:General  Level of Consciousness: awake, alert  and oriented  Airway and Oxygen Therapy: Patient Spontanous Breathing and Patient connected to face mask oxygen  Post-op Pain: mild  Post-op Assessment: Post-op Vital signs reviewed  Post-op Vital Signs: Reviewed  Complications: No apparent anesthesia complications

## 2014-01-16 NOTE — Progress Notes (Signed)
Radiology staff present for nuc med injection. Fentanyl and versed given for sedation. VSS (see doc flowsheets). Pt tol well.

## 2014-01-17 ENCOUNTER — Encounter (HOSPITAL_BASED_OUTPATIENT_CLINIC_OR_DEPARTMENT_OTHER): Payer: Self-pay | Admitting: General Surgery

## 2014-01-18 ENCOUNTER — Encounter (INDEPENDENT_AMBULATORY_CARE_PROVIDER_SITE_OTHER): Payer: Self-pay

## 2014-01-18 ENCOUNTER — Telehealth (INDEPENDENT_AMBULATORY_CARE_PROVIDER_SITE_OTHER): Payer: Self-pay | Admitting: *Deleted

## 2014-01-18 NOTE — Telephone Encounter (Signed)
Spoke to patient and gave her the below message.  Patient states understanding and agreeable.

## 2014-01-18 NOTE — Telephone Encounter (Signed)
Message copied by Theora Master on Wed Jan 18, 2014  1:04 PM ------      Message from: [name]      Created: Wed Jan 18, 2014  5:47 AM       Inform patient of Pathology report,.Tell her that her breast cancer was 1.2 cm in diameter. We had excellent negative margins and the lymph nodes are negative. This is good news and she will not require any further surgery. I will discuss this with her in detail at her next visit.            hmi ------

## 2014-01-18 NOTE — Progress Notes (Signed)
Quick Note:  Inform patient of Pathology report,.Tell her that her breast cancer was 1.2 cm in diameter. We had excellent negative margins and the lymph nodes are negative. This is good news and she will not require any further surgery. I will discuss this with her in detail at her next visit.  hmi ______

## 2014-01-27 ENCOUNTER — Encounter: Payer: Self-pay | Admitting: Oncology

## 2014-01-27 NOTE — Progress Notes (Unsigned)
Location of Breast Cancer: upper-outer quadrant of left female breast   Histology per Pathology Report:   Diagnosis 1. Breast, lumpectomy, Left - INVASIVE DUCTAL CARCINOMA, SEE COMMENT. - POSITIVE FOR LYMPH VASCULAR INVASION. - INVASIVE TUMOR IS GREATER THAN 1 CM FROM ANY SURGICAL MARGIN. - SEE TUMOR SYNOPTIC TEMPLATE BELOW. 2. Lymph node, sentinel, biopsy, Left Axillary #1 - ONE LYMPH NODE, NEGATIVE FOR TUMOR (0/1). 3. Lymph node, sentinel, biopsy, Left Axillary #2 - ONE LYMPH NODE, NEGATIVE FOR TUMOR (0/1).  Receptor Status: ER(negative), PR (negative), Her2-neu (negative)  Did patient present with symptoms (if so, please note symptoms) or was this found on screening mammography?: screening mammography  Past/Anticipated interventions by surgeon, if any: 01/16/14 Procedure: PARTIAL MASTECTOMY WITH NEEDLE LOCALIZATION AND AXILLARY SENTINEL LYMPH NODE BIOPSY;  Surgeon: [name], MD;  Location: [place];  Service: General;  Laterality: Left;  Past/Anticipated interventions by medical oncology, if any:  Apt with Dr. Jana Hakim 02/03/14  Lymphedema issues, if any:  {yes/no:18581} {Right/Left:21944}   Pain issues, if any:  {yes/no:18581} {pain description:21022940}  SAFETY ISSUES:  Prior radiation? no  Pacemaker/ICD? no  Possible current pregnancy?no  Is the patient on methotrexate? no  Current Complaints / other details:  Menarche age 55, first live birth age 21, the patient is [place] P2. She underwent menopause at age 14, and has been on hormone replacement for the last 23 years (estradiol and norethindrone most recently). This was stopped 12/17/2012        Jacqulyn Liner, RN 01/27/2014,3:47 PM

## 2014-01-30 ENCOUNTER — Ambulatory Visit: Payer: BC Managed Care – PPO | Admitting: Radiation Oncology

## 2014-01-30 ENCOUNTER — Ambulatory Visit: Payer: BC Managed Care – PPO

## 2014-01-31 ENCOUNTER — Encounter (INDEPENDENT_AMBULATORY_CARE_PROVIDER_SITE_OTHER): Payer: BC Managed Care – PPO | Admitting: General Surgery

## 2014-02-02 NOTE — Progress Notes (Addendum)
Location of Breast Cancer: upper-outer quadrant of left female breast   Histology per Pathology Report:  Diagnosis  1. Breast, lumpectomy, Left  - INVASIVE DUCTAL CARCINOMA, SEE COMMENT.  - POSITIVE FOR LYMPH VASCULAR INVASION.  - INVASIVE TUMOR IS GREATER THAN 1 CM FROM ANY SURGICAL MARGIN.  - SEE TUMOR SYNOPTIC TEMPLATE BELOW.  2. Lymph node, sentinel, biopsy, Left Axillary #1  - ONE LYMPH NODE, NEGATIVE FOR TUMOR (0/1).  3. Lymph node, sentinel, biopsy, Left Axillary #2  - ONE LYMPH NODE, NEGATIVE FOR TUMOR (0/1).   Receptor Status: ER(negative), PR (negative), Her2-neu (negative)   Did patient present with symptoms (if so, please note symptoms) or was this found on screening mammography?: screening mammography   Past/Anticipated interventions by surgeon, if any: 01/16/14 Procedure: PARTIAL MASTECTOMY WITH NEEDLE LOCALIZATION AND AXILLARY SENTINEL LYMPH NODE BIOPSY; Surgeon: [name], MD; Location: [place]; Service: General; Laterality: Left;   Past/Anticipated interventions by medical oncology, if any: none  Lymphedema issues, if any: no  Pain issues, if any: no   SAFETY ISSUES:  Prior radiation? no  Pacemaker/ICD? no  Possible current pregnancy?no  Is the patient on methotrexate? No  Current Complaints / other details: Menarche age 76, first live birth age 24, the patient is [place] P2. She underwent menopause at age 55, and has been on hormone replacement for the last 23 years (estradiol and norethindrone most recently). This was stopped 12/17/2012   Has two children.  Here with her husband.  She has questions about vitamins and how much she should be taking.

## 2014-02-03 ENCOUNTER — Encounter: Payer: BC Managed Care – PPO | Admitting: Oncology

## 2014-02-03 NOTE — Progress Notes (Signed)
[place]  [phone] [phone]) [phone]     ID: Sheri Arellano OB: 1951/08/01  MR#: [id number]  DHR#:[id number]  PCP: [name], MD GYN:  Olena Mater SU: Fanny Skates OTHER MD: Gery Pray  CHIEF COMPLAINT: "I am considering having both breasts taken off".  HISTORY OF PRESENT ILLNESS: Sheri Arellano had routine screening mammography at [facility] 11/21/2013 showing an irregular density in the left breast at the 1:00 position. Ultrasound was obtained 11/29/2013 and showed a 4 mm tolerate them live irregular mass in the left breast at the 2:00 position. This was hypoechoic. Biopsy of this area 12/12/2013 showed ([id number]) an invasive ductal carcinoma, grade 1 or 2, estrogen receptor 0%, progesterone receptor 0%, with no HER-2 amplification, the signals ratio being 1.09 and the number per cell be 1.95. The MIB-1-1 was 72%.  The patient subsequent history is as detailed below  INTERVAL HISTORY: Sheri Arellano was evaluated in the multidisciplinary breast cancer clinic 12/21/2013 accompanied by her husband Sheri Arellano. Her case was also discussed at the multidisciplinary breast cancer conference that morning.  REVIEW OF SYSTEMS: There were no specific symptoms leading to the original mammogram, which was routinely scheduled. The patient denies unusual headaches, visual changes, nausea, vomiting, stiff neck, dizziness, or gait imbalance. There has been no cough, phlegm production, or pleurisy, no chest pain or pressure, and no change in bowel or bladder habits. She admits to mild stress urinary incontinence and to some anxiety related to her new diagnosis. She has chronic mild heartburn The patient denies fever, rash, bleeding, unexplained fatigue or unexplained weight loss. A detailed review of systems was otherwise entirely negative.  PAST MEDICAL HISTORY: Past Medical History  Diagnosis Date  . Esophageal dysmotility   . Anxiety     probable panic attacks  . Insomnia     . Iron deficiency anemia   . GERD (gastroesophageal reflux disease)   . Hyperlipidemia   . Dysphagia   . Vitamin D deficiency   . Other malaise and fatigue   . SVT (supraventricular tachycardia)     documented-takes metoprolol as needed    PAST SURGICAL HISTORY: Past Surgical History  Procedure Laterality Date  . Carpal tunnel release      lt and rt  . Cholecystectomy  1999  . Partial mastectomy with needle localization and axillary sentinel lymph node bx Left 01/16/2014    Procedure: PARTIAL MASTECTOMY WITH NEEDLE LOCALIZATION AND AXILLARY SENTINEL LYMPH NODE BIOPSY;  Surgeon: [name], MD;  Location: [place];  Service: General;  Laterality: Left;    FAMILY HISTORY Family History  Problem Relation Age of Onset  . Atrial fibrillation Mother   . Hyperlipidemia Mother   . Hypertension Mother   . Cancer Father     bladder  . Cancer Maternal Aunt     breast   the patient's parents are living, both in their 51s. The patient had one brother and one no sisters. One of the patient's mothers 3 sisters was diagnosed with breast cancer before the age of 87. There is no history of ovarian cancer in the family.  GYNECOLOGIC HISTORY:  Menarche age 36, first live birth age 77, the patient is [place] P2. She underwent menopause at age 20, and has been on hormone replacement for the last 23 years (estradiol and norethindrone most recently). 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 Sheri Arellano used to work for [employer]. Their son date lives in [place] where he  owns an Nurse, learning disability and export company of [place] [employer]. Son Sheri Arellano lives in [facility] [place] [facility] [place], currently working for [employer]. The patient has 3 grandchildren. She is a Investment banker, operational.    ADVANCED DIRECTIVES: In place   HEALTH MAINTENANCE: History  Substance Use Topics  . Smoking status: Former Smoker    Quit date: 01/22/1976  . Smokeless tobacco: Former Neurosurgeon     Quit date: 02/16/1975  . Alcohol Use: 0.6 oz/week    1 Glasses of wine per week     Comment: rarely     Colonoscopy:  PAP:  Bone density:  Lipid panel:  Allergies  Allergen Reactions  . Erythromycin Base Other (See Comments)    Stomach ache    Current Outpatient Prescriptions  Medication Sig Dispense Refill  . aspirin EC 81 MG tablet Take 81 mg by mouth daily.      . Cholecalciferol (VITAMIN D) 2000 UNITS tablet Take 2,000 Units by mouth daily.      . clonazePAM (KLONOPIN) 1 MG tablet Take 1 mg by mouth at bedtime as needed (sleep).       . Fiber-Vitamins-Minerals (FIBERALL PO) Take 2 tablets by mouth every evening.       Marland Kitchen HYDROcodone-acetaminophen (NORCO/VICODIN) 5-325 MG per tablet Take 1-2 tablets by mouth every 4 (four) hours as needed.  30 tablet  0  . loratadine (CLARITIN) 10 MG tablet Take 10 mg by mouth daily.      . metoprolol tartrate (LOPRESSOR) 25 MG tablet Take 25 mg by mouth as needed.      . Multiple Vitamin (MULTIVITAMIN WITH MINERALS) TABS tablet Take 1 tablet by mouth daily.      . nitroGLYCERIN (NITROSTAT) 0.4 MG SL tablet Place 0.4 mg under the tongue every 5 (five) minutes as needed for chest pain.       . pantoprazole (PROTONIX) 40 MG tablet Take 40 mg by mouth daily.      . sertraline (ZOLOFT) 100 MG tablet Take 50 mg by mouth daily. Takes 1/2 daily      . simvastatin (ZOCOR) 40 MG tablet Take 20 mg by mouth at bedtime. Takes 1/2 daily       No current facility-administered medications for this visit.    OBJECTIVE: Middle-aged white woman in no acute distress There were no vitals filed for this visit.   There is no weight on file to calculate BMI.    ECOG FS:0 - Asymptomatic  Ocular: Sclerae unicteric, pupils equal, round and reactive to light Ear-nose-throat: Oropharynx clear, dentition fair Lymphatic: No cervical or supraclavicular adenopathy Lungs no rales or rhonchi, good excursion bilaterally Heart regular rate and rhythm, no murmur  appreciated Abd soft, nontender, positive bowel sounds MSK no focal spinal tenderness, no joint edema Neuro: non-focal, well-oriented, appropriate affect Breasts: The right breast is unremarkable. The left breast is status post recent biopsy. There is some mild associated ecchymosis. I do not palpate a mass and there is no skin or nipple change of concern. The left axilla is benign.   LAB RESULTS:  CMP     Component Value Date/Time   NA 139 12/21/2013 1229   NA 137 03/21/2009 1202   K 4.1 12/21/2013 1229   K 3.3* 03/21/2009 1202   CL 104 03/21/2009 1202   CO2 27 12/21/2013 1229   CO2 27 03/21/2009 1202   GLUCOSE 95 12/21/2013 1229   GLUCOSE 110* 03/21/2009 1202   BUN 10.5 12/21/2013 1229   BUN 9 03/21/2009 1202   CREATININE 0.8  12/21/2013 1229   CREATININE 0.75 03/21/2009 1202   CALCIUM 9.5 12/21/2013 1229   CALCIUM 8.8 03/21/2009 1202   PROT 7.0 12/21/2013 1229   ALBUMIN 3.9 12/21/2013 1229   AST 12 12/21/2013 1229   ALT 11 12/21/2013 1229   ALKPHOS 52 12/21/2013 1229   BILITOT 0.43 12/21/2013 1229   GFRNONAA >60 03/21/2009 1202   GFRAA  Value: >60        The eGFR has been calculated using the MDRD equation. This calculation has not been validated in all clinical situations. eGFR's persistently <60 mL/min signify possible Chronic Kidney Disease. 03/21/2009 1202    I No results found for this basename: SPEP,  UPEP,   kappa and lambda light chains    Lab Results  Component Value Date   WBC 7.7 12/21/2013   NEUTROABS 4.7 12/21/2013   HGB 15.2 12/21/2013   HCT 43.5 12/21/2013   MCV 94.5 12/21/2013   PLT 275 12/21/2013      Chemistry      Component Value Date/Time   NA 139 12/21/2013 1229   NA 137 03/21/2009 1202   K 4.1 12/21/2013 1229   K 3.3* 03/21/2009 1202   CL 104 03/21/2009 1202   CO2 27 12/21/2013 1229   CO2 27 03/21/2009 1202   BUN 10.5 12/21/2013 1229   BUN 9 03/21/2009 1202   CREATININE 0.8 12/21/2013 1229   CREATININE 0.75 03/21/2009 1202      Component Value Date/Time   CALCIUM  9.5 12/21/2013 1229   CALCIUM 8.8 03/21/2009 1202   ALKPHOS 52 12/21/2013 1229   AST 12 12/21/2013 1229   ALT 11 12/21/2013 1229   BILITOT 0.43 12/21/2013 1229       No results found for this basename: LABCA2    No components found with this basename: LABCA125    No results found for this basename: INR,  in the last 168 hours  Urinalysis    Component Value Date/Time   COLORURINE YELLOW 03/21/2009 1122   APPEARANCEUR CLOUDY* 03/21/2009 1122   LABSPEC 1.011 03/21/2009 1122   PHURINE 7.5 03/21/2009 1122   GLUCOSEU NEGATIVE 03/21/2009 [place] 03/21/2009 [place] 03/21/2009 1122   KETONESUR NEGATIVE 03/21/2009 1122   PROTEINUR NEGATIVE 03/21/2009 1122   UROBILINOGEN 0.2 03/21/2009 1122   NITRITE NEGATIVE 03/21/2009 1122   LEUKOCYTESUR NEGATIVE MICROSCOPIC NOT DONE ON URINES WITH NEGATIVE PROTEIN, BLOOD, LEUKOCYTES, NITRITE, OR GLUCOSE <1000 mg/dL. 03/21/2009 1122    STUDIES: No results found.  ASSESSMENT: 63 y.o. [place] woman status post left breast biopsy 12/12/2013 for a clinical T1a N0 invasive ductal carcinoma, low to intermediate grade, triple negative, with an MIB-1 of 72%  (1) status post left lumpectomy and sentinel lymph node sampling 01/16/2014 for a pT1c pN0, stage IA invasive ductal carcinoma, grade 3, repeat prognostic panel again triple negative.  PLAN: We spent the better part of today's hour-long appointment discussing the biology of breast cancer in general, and the specifics of the patient's tumor in particular. [name] understands the treatment of breast cancer has local and systemic components. From a local treatment point of view, the survival is identical for mastectomy versus lumpectomy plus radiation. She is an excellent candidate for breast conservation and that was my strong recommendation to her.  From a systemic treatment point of view her only option would be chemotherapy. However, NCCN guidelines suggest no systemic adjuvant  treatment for T1a invasive breast cancers. (For T1b cases they suggest chemotherapy be considered; it  is not positive leg recommended). Accordingly we are not requesting that a port be placed at the time of surgery.   The patient understands estrogen and itself does not cause breast cancer. It can cause cancer of the uterus however. Estrogen and progesterone given together do not cause cancer of the uterus, but they do cause breast cancer. I find it correct that she stopped her hormone replacement at this point since we have no reliable date that in patients with a history of breast cancer, whether estrogen receptor positive are not, hormone replacement is safe.  Sheri Arellano is interested in seeking a second opinion. We have no problem with that and I reassured her that she has time to do so. We will be glad to help her organize that if that is her decision.  Otherwise I will see the patient again in a little over a month. By that time we should have all the data we need to make a definitive decision regarding chemotherapy. We will also discuss followup at that point.  Sheri Arellano has a good understanding of the overall plan, and agrees with it. She knows a goal of treatment in her case is cure. She will call with any problems that may develop before her next visit here.   Chauncey Cruel, MD   02/03/2014 11:03 AM  This encounter was created in error - please disregard.

## 2014-02-04 ENCOUNTER — Other Ambulatory Visit: Payer: Self-pay | Admitting: Oncology

## 2014-02-08 ENCOUNTER — Ambulatory Visit
Admission: RE | Admit: 2014-02-08 | Discharge: 2014-02-08 | Disposition: A | Discharge status: Home / Self Care | Payer: BC Managed Care – PPO | Source: Ambulatory Visit | Attending: Radiation Oncology | Admitting: Radiation Oncology

## 2014-02-08 ENCOUNTER — Encounter: Payer: Self-pay | Admitting: Radiation Oncology

## 2014-02-08 ENCOUNTER — Telehealth: Payer: Self-pay | Admitting: *Deleted

## 2014-02-08 VITALS — BP 120/80 | HR 73 | Temp 98.4°F | Ht 62.5 in | Wt 154.2 lb

## 2014-02-08 DIAGNOSIS — Z79899 Other long term (current) drug therapy: Secondary | ICD-10-CM | POA: Insufficient documentation

## 2014-02-08 DIAGNOSIS — C50412 Malignant neoplasm of upper-outer quadrant of left female breast: Secondary | ICD-10-CM

## 2014-02-08 DIAGNOSIS — C50919 Malignant neoplasm of unspecified site of unspecified female breast: Secondary | ICD-10-CM | POA: Insufficient documentation

## 2014-02-08 DIAGNOSIS — Z9221 Personal history of antineoplastic chemotherapy: Secondary | ICD-10-CM | POA: Insufficient documentation

## 2014-02-08 DIAGNOSIS — Z7982 Long term (current) use of aspirin: Secondary | ICD-10-CM | POA: Insufficient documentation

## 2014-02-08 NOTE — Telephone Encounter (Signed)
Called and spoke with patient and confirmed appt. For 02/13/14 at 330.  Patient could not come on 02/14/14.  Patient is very anxious about finding out that she has to receive chemo.  Gave support.  Encouraged her to call with any needs.

## 2014-02-08 NOTE — Progress Notes (Signed)
Radiation Oncology         [phone] ________________________________  Name: Sheri Arellano MRN: [id number]  Date: 02/08/2014  DOB: [birth year]  Re-evaluation note  CC: [name], MD  [name], MD  Diagnosis:   Stage I invasive ductal carcinoma of the left breast (triple negative, pT1c, pN0, Mx)  Narrative:  The patient returns today for further evaluation. She was initially seen in the multidisciplinary breast clinic on January 14. Since her initial evaluation the patient has proceeded to undergo definitive surgery.  she underwent a left partial mastectomy with needle localization and left axillary sentinel node biopsy under the direction of Dr. Dalbert Batman on February 9. Upon pathologic review the patient was found to have a grade 3 invasive ductal carcinoma, nipple negative with a Ki-67 of 72%.  2 benign sentinel lymph nodes were recovered at the time of the patient's surgery. The tumor measured 1.2 cm in greatest dimension.  She has done well since her surgery with no significant pain in the breast or swelling in her left arm or mobility issues. Patient presents today for discussion of radiation therapy as part of breast conserving treatment.                            ALLERGIES:  is allergic to erythromycin base.  Meds: Current Outpatient Prescriptions  Medication Sig Dispense Refill  . aspirin EC 81 MG tablet Take 81 mg by mouth daily.      . Cholecalciferol (VITAMIN D) 2000 UNITS tablet Take 2,000 Units by mouth daily.      . clonazePAM (KLONOPIN) 1 MG tablet Take 1 mg by mouth at bedtime as needed (sleep).       . Fiber-Vitamins-Minerals (FIBERALL PO) Take 2 tablets by mouth every evening.       . loratadine (CLARITIN) 10 MG tablet Take 10 mg by mouth daily.      . metoprolol tartrate (LOPRESSOR) 25 MG tablet Take 25 mg by mouth as needed.      . nitroGLYCERIN (NITROSTAT) 0.4 MG SL tablet Place 0.4 mg under the tongue every 5 (five) minutes as needed for chest pain.        . pantoprazole (PROTONIX) 40 MG tablet Take 40 mg by mouth daily.      . sertraline (ZOLOFT) 100 MG tablet Take 50 mg by mouth daily. Takes 1/2 daily      . simvastatin (ZOCOR) 40 MG tablet Take 20 mg by mouth at bedtime. Takes 1/2 daily      . HYDROcodone-acetaminophen (NORCO/VICODIN) 5-325 MG per tablet Take 1-2 tablets by mouth every 4 (four) hours as needed.  30 tablet  0   No current facility-administered medications for this encounter.    Physical Findings: The patient is in no acute distress. Patient is alert and oriented. She is accompanied by her husband on evaluation today.  height is 5' 2.5" (1.588 m) and weight is 154 lb 3.2 oz (69.945 kg). Her temperature is 98.4 F (36.9 C). Her blood pressure is 120/80 and her pulse is 73. Marland Kitchen  No palpable cervical supraclavicular or axillary adenopathy. The lungs are clear to auscultation. The heart has regular rhythm and rate.  examination of the left breast reveals a well-healing scar in the upper outer quadrant. The patient has a second scar in the low axillary region from her sentinel node procedure. There is mild bruising along the nipple area. No dominant masses appreciated in the breast. No  signs of infection.  Lab Findings: Lab Results  Component Value Date   WBC 7.7 12/21/2013   HGB 15.2 12/21/2013   HCT 43.5 12/21/2013   MCV 94.5 12/21/2013   PLT 275 12/21/2013      Radiographic Findings: Nm Sentinel Node Inj-no Rpt (breast)  01/16/2014   CLINICAL DATA: Cancer left breast   Sulfur colloid was injected intradermally by the nuclear medicine  technologist for breast cancer sentinel node localization.    Mm Digital Diagnostic Unilat L  01/11/2014   CLINICAL DATA:  Status post MR guided core biopsy of a nodule in the left breast.  EXAM: POST-BIOPSY CLIP PLACEMENT LEFT DIAGNOSTIC MAMMOGRAM  COMPARISON:  Previous exams.  FINDINGS: Films are performed following MR guided biopsy of a nodule in the upper central left breast. Mammographic  images were obtained following MR guided core biopsy. During the biopsy 2 clips were placed. A dumbbell-shaped clip was placed and MR imaging showed that the clip appeared to be located superior to the mass. Therefore additional tissue samples were obtained and post biopsy images showed that the tissue samples were taken through the mass and a second cylindrical shaped clip was placed. On the CC view to the clips are located 3.8 cm apart. On the lateral view they are located 7 mm apart.  IMPRESSION: Status post MR guided core biopsy of the left breast with pathology pending.  Final Assessment: Post Procedure Mammograms for Marker Placement   Electronically Signed   By: Lillia Mountain M.D.   On: 01/11/2014 10:18   [name] Breast Bx [name] Dev 1st Lesion Image Bx Spec Mr Guide  01/13/2014   ADDENDUM REPORT: 01/12/2014 11:50  ADDENDUM: Pathology revealed fibrocystic change, columnar cell change, pseudoangiomatous stromal hyperplasia (PASH) and fibroadenomatoid nodules in the left breast. This was found to be concordant by Dr. Lovey Newcomer. Pathology was relayed to the patient by telephone. The patient reported doing well after the biopsy with minimal tenderness at the site. Post biopsy instructions were reviewed and her questions were answered. She was encouraged to call [facility] for any additional concerns. She has recently been diagnosed with left breast cancer and is told to follow her treatment plan.  Pathology results reported by [name] RN, BSN on January 12, 2014.   Electronically Signed   By: Lillia Mountain M.D.   On: 01/12/2014 11:50   01/13/2014   CLINICAL DATA:  Known left breast cancer. MR guided core biopsy of a nodule in the upper central left breast seen with MR imaging.  EXAM: MRI GUIDED CORE NEEDLE BIOPSY OF THE LEFT BREAST  TECHNIQUE: Multiplanar, multisequence MR imaging of the left breast was performed both before and after administration of intravenous contrast.   CONTRAST:  92mL MULTIHANCE GADOBENATE DIMEGLUMINE 529 MG/ML IV SOLN  COMPARISON:  Previous exams.  FINDINGS: I met with the patient, and we discussed the procedure of MRI guided biopsy, including risks, benefits, and alternatives. Specifically, we discussed the risks of infection, bleeding, tissue injury, clip migration, and inadequate sampling. Informed, written consent was given. The usual time out protocol was performed immediately prior to the procedure.  Using sterile technique, 2% Lidocaine, MRI guidance, and a 9 gauge vacuum assisted device, biopsy was performed of a nodule in the upper central left breast using a lateral approach. At the conclusion of the procedure, 2 tissue marker clip was deployed into the biopsy cavity. A dumbbell-shaped clip was placed first into the breast and post biopsy  images show the biopsy site was superior to the nodule. Additional tissue samples were obtained and a second cylindrical shaped clip was placed. Follow-up 2-view mammogram was performed and dictated separately.  IMPRESSION: MRI guided biopsy of the left breast. No apparent complications.  Electronically Signed: By: Lillia Mountain M.D. On: 01/11/2014 10:21    Impression:   Stage I invasive ductal carcinoma of the left breast (triple negative, pT1c, pN0, Mx).  The patient would be an excellent candidate for breast conservation with radiation therapy directed at the left breast. I discussed the treatment course side effects and potential toxicities of radiation therapy in this situation with the patient and her husband. She appears to understand and wishes to proceed with radiation therapy as part of breast conserving therapy.   Plan:  The patient will be seen by medical oncology (Dr. Jana Hakim) March 10. She will likely proceed with adjuvant chemotherapy given her high-grade triple negative tumor.  The patient will be seen again after she completes her adjuvant chemotherapy to coordinate scheduling of her radiation  treatment  ____________________________________ Blair Promise, MD

## 2014-02-08 NOTE — Progress Notes (Signed)
Please see the Nurse Progress Note in the MD Initial Consult Encounter for this patient. 

## 2014-02-09 ENCOUNTER — Encounter (INDEPENDENT_AMBULATORY_CARE_PROVIDER_SITE_OTHER): Payer: Self-pay | Admitting: General Surgery

## 2014-02-09 ENCOUNTER — Encounter (INDEPENDENT_AMBULATORY_CARE_PROVIDER_SITE_OTHER): Payer: Self-pay

## 2014-02-09 ENCOUNTER — Ambulatory Visit (INDEPENDENT_AMBULATORY_CARE_PROVIDER_SITE_OTHER): Payer: BC Managed Care – PPO | Admitting: General Surgery

## 2014-02-09 VITALS — BP 108/72 | HR 60 | Temp 98.9°F | Resp 12 | Ht 62.0 in | Wt 150.6 lb

## 2014-02-09 DIAGNOSIS — C50412 Malignant neoplasm of upper-outer quadrant of left female breast: Secondary | ICD-10-CM

## 2014-02-09 DIAGNOSIS — C50419 Malignant neoplasm of upper-outer quadrant of unspecified female breast: Secondary | ICD-10-CM

## 2014-02-09 NOTE — Progress Notes (Signed)
Patient ID: Sheri Arellano, female   DOB: May 10, 1951, 63 y.o.   MRN: [id number] History: This patient underwent left partial mastectomy and sentinel node biopsy on 01/16/2014. Preoperative imaging studies and biopsy suggested that she had a 4 mm triple negative breast cancer. I did a transverse elliptical incision because the tumor was so close the skin.      I got widely negative margins. This is a triple negative for breast cancer, invasive ductal, sentinel nose negative, Ki 67 72%. Pathology report revealed that this was a larger tumor, 1.2 cm, stage T1c, N0.      She's having no problems with wound healing and feels fine.      She saw Dr. Sondra Come yesterday, and he told her that she may need chemotherapy.. This was frustrating to her since preoperatively she had been told she probably would not need chemotherapy. I explained to her that this was all based on the tumor diameter, and that the imaging studies had underestimated the tumor diameter.  She is scheduled to see Dr. Jana Hakim  this coming Monday.  Exam: Patient looks very good. In no distress. Left breast reveals radially oriented incision at the 3:00 position. Healing normally. Left axilla incision healing normally. No hematoma. No infection. Almost no thickening of the tissues. Cosmesis is very good.  Assessment:  Invasive ductal carcinoma left breast, 3:00 position, TNBC, 1.2 cm diameter, stage T1c, , N0. Recovering uneventfully 3 weeks postop left partial mastectomy and sentinel node biopsy  Plan: I advised her also that Dr. Jana Hakim will be discussing the pros and cons of chemotherapy with her next week. I told her that if necessary, we will be happy to put a Port-A-Cath in her. I discussed the indications, techniques and risks of this procedure with her and showed her a Port-A-Cath. Activity discussed. Otherwise I will see her in 5 months for surveillance.   Edsel Petrin. Dalbert Batman, M.D., [facility] General and  Minimally invasive Surgery Breast and Colorectal Surgery Office:   [phone] Pager:   [phone]

## 2014-02-09 NOTE — Patient Instructions (Signed)
You are recovering from your left lumpectomy and left axillary sentinel node biopsy without any obvious wound complications. I am very pleased with the result to date.  As you discussed with Dr. Sondra Come yesterday, the tumor was a little bit larger than we thought from the preoperative x-rays. This made push you into a category where chemotherapy will be required.  Discuss the chemotherapy issues with Dr. Jana Hakim next Monday. Let me know if you decide to have a Port-A-Cath inserted, and we will arrange for that at your convenience.  Otherwise you may resume normal physical activities without restriction.  Otherwise, return to see Dr. Dalbert Batman in 5 months for a physical exam for surveillance.

## 2014-02-13 ENCOUNTER — Ambulatory Visit (HOSPITAL_BASED_OUTPATIENT_CLINIC_OR_DEPARTMENT_OTHER): Payer: BC Managed Care – PPO | Admitting: Oncology

## 2014-02-13 VITALS — BP 122/81 | HR 66 | Temp 98.6°F | Resp 20 | Ht 62.0 in | Wt 152.6 lb

## 2014-02-13 DIAGNOSIS — C50419 Malignant neoplasm of upper-outer quadrant of unspecified female breast: Secondary | ICD-10-CM

## 2014-02-13 DIAGNOSIS — Z171 Estrogen receptor negative status [ER-]: Secondary | ICD-10-CM

## 2014-02-13 DIAGNOSIS — C50412 Malignant neoplasm of upper-outer quadrant of left female breast: Secondary | ICD-10-CM

## 2014-02-13 NOTE — Progress Notes (Signed)
[place]  [phone] [phone]     ID: [name] OB: [birth year]  MR#: [id number]  GDJ#:[id number]  PCP: [name], MD GYN:  Olena Mater SU: Fanny Skates OTHER MD: Gery Pray, Crista Luria  CHIEF COMPLAINT: "I am considering having both breasts taken off".  HISTORY OF PRESENT ILLNESS: [name] had routine screening mammography at [facility] 11/21/2013 showing an irregular density in the left breast at the 1:00 position. Ultrasound was obtained 11/29/2013 and showed a 4 mm tolerate them live irregular mass in the left breast at the 2:00 position. This was hypoechoic. Biopsy of this area 12/12/2013 showed ([id number]) an invasive ductal carcinoma, grade 1 or 2, estrogen receptor 0%, progesterone receptor 0%, with no HER-2 amplification, the signals ratio being 1.09 and the number per cell be 1.95. The MIB-1-1 was 72%.  The patient subsequent history is as detailed below  INTERVAL HISTORY: [name] returns today for followup of her breast cancer accompanied by her husband [name]. Since her last visit here she had her definitive left lumpectomy and sentinel lymph node sampling (this was performed 01/16/2014, and showed ([id number]) and invasive ductal carcinoma measuring 1.2 cm, grade 3, with repeat prognostic panel again triple negative. She was supposed to see me about 2 weeks ago but apparently she did not get the word. When we reviewed her pathology repor I discussed it with Dr. Sondra Come and he told the patient and she would need to consider chemotherapy. She is here today to discuss that further.   REVIEW OF SYSTEMS:  [name] is fine with the surgery, without unusual pain, fever, or bleeding. She went back to work almost immediately. She remains very active in general but does not exercise regularly. She also admits to a "horrible" diet. A detailed review of systems today was otherwise entirely negative   PAST MEDICAL HISTORY: Past Medical  History  Diagnosis Date  . Esophageal dysmotility   . Anxiety     probable panic attacks  . Insomnia   . Iron deficiency anemia   . GERD (gastroesophageal reflux disease)   . Hyperlipidemia   . Dysphagia   . Vitamin D deficiency   . Other malaise and fatigue   . SVT (supraventricular tachycardia)     documented-takes metoprolol as needed    PAST SURGICAL HISTORY: Past Surgical History  Procedure Laterality Date  . Carpal tunnel release      lt and rt  . Cholecystectomy  1999  . Partial mastectomy with needle localization and axillary sentinel lymph node bx Left 01/16/2014    Procedure: PARTIAL MASTECTOMY WITH NEEDLE LOCALIZATION AND AXILLARY SENTINEL LYMPH NODE BIOPSY;  Surgeon: [name], MD;  Location: [place];  Service: General;  Laterality: Left;    FAMILY HISTORY Family History  Problem Relation Age of Onset  . Atrial fibrillation Mother   . Hyperlipidemia Mother   . Hypertension Mother   . Cancer Father     bladder  . Cancer Maternal Aunt     breast   the patient's parents are living, both in their 61s. The patient had one brother and one no sisters. One of the patient's mothers 3 sisters was diagnosed with breast cancer before the age of 78. There is no history of ovarian cancer in the family.  GYNECOLOGIC HISTORY:  Menarche age 11, first live birth age 12, the patient is [place] P2. She underwent menopause at age 44, and has been on hormone replacement for the last 23 years (estradiol and  norethindrone most recently). 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 [name] used to work for [employer]. Their son date lives in [place] where he owns an Electronics engineer and [place] [employer] [place] [employer]. Son Ruby Cola lives in [place], currently working for [employer]. The patient has 3 grandchildren. She is a Furniture conservator/restorer.    ADVANCED DIRECTIVES: In place   HEALTH MAINTENANCE: History  Substance Use  Topics  . Smoking status: Former Smoker    Quit date: 01/22/1976  . Smokeless tobacco: Former Systems developer    Quit date: 02/16/1975  . Alcohol Use: 0.6 oz/week    1 Glasses of wine per week     Comment: rarely     Colonoscopy:  PAP:  Bone density:  Lipid panel:  Allergies  Allergen Reactions  . Erythromycin Base Other (See Comments)    Stomach ache    Current Outpatient Prescriptions  Medication Sig Dispense Refill  . aspirin EC 81 MG tablet Take 81 mg by mouth daily.      . Cholecalciferol (VITAMIN D) 2000 UNITS tablet Take 2,000 Units by mouth daily.      . clonazePAM (KLONOPIN) 1 MG tablet Take 1 mg by mouth at bedtime as needed (sleep).       . Fiber-Vitamins-Minerals (FIBERALL PO) Take 2 tablets by mouth every evening.       Marland Kitchen HYDROcodone-acetaminophen (NORCO/VICODIN) 5-325 MG per tablet Take 1-2 tablets by mouth every 4 (four) hours as needed.  30 tablet  0  . loratadine (CLARITIN) 10 MG tablet Take 10 mg by mouth daily.      . metoprolol tartrate (LOPRESSOR) 25 MG tablet Take 25 mg by mouth as needed.      . nitroGLYCERIN (NITROSTAT) 0.4 MG SL tablet Place 0.4 mg under the tongue every 5 (five) minutes as needed for chest pain.       . pantoprazole (PROTONIX) 40 MG tablet Take 40 mg by mouth daily.      . sertraline (ZOLOFT) 100 MG tablet Take 50 mg by mouth daily. Takes 1/2 daily      . simvastatin (ZOCOR) 40 MG tablet Take 20 mg by mouth at bedtime. Takes 1/2 daily       No current facility-administered medications for this visit.    OBJECTIVE: Middle-aged white woman who appears well Filed Vitals:   02/13/14 1547  BP: 122/81  Pulse: 66  Temp: 98.6 F (37 C)  Resp: 20     Body mass index is 27.9 kg/(m^2).    ECOG FS:0 - Asymptomatic  Ocular: Sclerae unicteric, pupils round and equal Ear-nose-throat: Oropharynx clear and moist Lymphatic: No cervical or supraclavicular adenopathy Lungs no rales or rhonchi, good excursion bilaterally Heart regular rate and rhythm, no  murmur appreciated Abd soft, nontender, positive bowel sounds MSK no focal spinal tenderness, no joint edema Neuro: non-focal, well-oriented, positive affect Breasts: The right breast is unremarkable. The left breast is status post lumpectomy. The cosmetic result is excellent. The incision is healing nicely. There are no findings of concern. The left axilla is benign  LAB RESULTS:  CMP     Component Value Date/Time   NA 139 12/21/2013 1229   NA 137 03/21/2009 1202   K 4.1 12/21/2013 1229   K 3.3* 03/21/2009 1202   CL 104 03/21/2009 1202   CO2 27 12/21/2013 1229   CO2 27 03/21/2009 1202   GLUCOSE 95 12/21/2013 1229   GLUCOSE 110* 03/21/2009 1202  BUN 10.5 12/21/2013 1229   BUN 9 03/21/2009 1202   CREATININE 0.8 12/21/2013 1229   CREATININE 0.75 03/21/2009 1202   CALCIUM 9.5 12/21/2013 1229   CALCIUM 8.8 03/21/2009 1202   PROT 7.0 12/21/2013 1229   ALBUMIN 3.9 12/21/2013 1229   AST 12 12/21/2013 1229   ALT 11 12/21/2013 1229   ALKPHOS 52 12/21/2013 1229   BILITOT 0.43 12/21/2013 1229   GFRNONAA >60 03/21/2009 1202   GFRAA  Value: >60        The eGFR has been calculated using the MDRD equation. This calculation has not been validated in all clinical situations. eGFR's persistently <60 mL/min signify possible Chronic Kidney Disease. 03/21/2009 1202    I No results found for this basename: SPEP,  UPEP,   kappa and lambda light chains    Lab Results  Component Value Date   WBC 7.7 12/21/2013   NEUTROABS 4.7 12/21/2013   HGB 15.2 12/21/2013   HCT 43.5 12/21/2013   MCV 94.5 12/21/2013   PLT 275 12/21/2013      Chemistry      Component Value Date/Time   NA 139 12/21/2013 1229   NA 137 03/21/2009 1202   K 4.1 12/21/2013 1229   K 3.3* 03/21/2009 1202   CL 104 03/21/2009 1202   CO2 27 12/21/2013 1229   CO2 27 03/21/2009 1202   BUN 10.5 12/21/2013 1229   BUN 9 03/21/2009 1202   CREATININE 0.8 12/21/2013 1229   CREATININE 0.75 03/21/2009 1202      Component Value Date/Time   CALCIUM 9.5 12/21/2013 1229    CALCIUM 8.8 03/21/2009 1202   ALKPHOS 52 12/21/2013 1229   AST 12 12/21/2013 1229   ALT 11 12/21/2013 1229   BILITOT 0.43 12/21/2013 1229       No results found for this basename: LABCA2    No components found with this basename: LABCA125    No results found for this basename: INR,  in the last 168 hours  Urinalysis    Component Value Date/Time   COLORURINE YELLOW 03/21/2009 1122   APPEARANCEUR CLOUDY* 03/21/2009 1122   LABSPEC 1.011 03/21/2009 1122   PHURINE 7.5 03/21/2009 1122   GLUCOSEU NEGATIVE 03/21/2009 [place] 03/21/2009 [place] 03/21/2009 1122   KETONESUR NEGATIVE 03/21/2009 1122   PROTEINUR NEGATIVE 03/21/2009 1122   UROBILINOGEN 0.2 03/21/2009 1122   NITRITE NEGATIVE 03/21/2009 1122   LEUKOCYTESUR NEGATIVE MICROSCOPIC NOT DONE ON URINES WITH NEGATIVE PROTEIN, BLOOD, LEUKOCYTES, NITRITE, OR GLUCOSE <1000 mg/dL. 03/21/2009 1122    STUDIES: Nm Sentinel Node Inj-no Rpt (breast)  01/16/2014   CLINICAL DATA: Cancer left breast   Sulfur colloid was injected intradermally by the nuclear medicine  technologist for breast cancer sentinel node localization.     ASSESSMENT: 63 y.o. [place] woman status post left breast biopsy 12/12/2013 for a clinical T1a N0 invasive ductal carcinoma, low to intermediate grade, triple negative, with an MIB-1 of 72%  (1) status post left lumpectomy and sentinel lymph node sampling 01/16/2014 for a pT1c pN0, stage IA invasive ductal carcinoma, grade 3, repeat prognostic panel again triple negative.  PLAN: We spent well over one hour today going over [name]'s situation. The original study suggested that possibly her tumor could be very small, perhaps a T1a, and accordingly we left the question of chemotherapy open. Now that we know it is a T1c triple negative tumor, NCCN guidelines unambiguously recommend chemotherapy.  We would over the adjuvant! Prognosis, which would  quote her an approximately 30% risk of recurrence,  cut down one third by chemotherapy.  This means her chance of this tumor not recurring within the next 10 years would rise from 70% to 80% with chemotherapy.All these numbers were written out for her. She also also reassured that we could help her get a second opinion quickly if she wished to.  After much discussion she decided she was ready to start chemotherapy and we specifically discussed cyclophosphamide and docetaxel to be given via port every 3 weeks for 4 cycles, with Neulasta support. We talked about the possible toxicities, side effects and complications of these agents.  [name] is planning a trip to [place] with her family. Accordingly we will plan to start her chemotherapy on March 30. I will contact Dr. Dalbert Batman to see how soon he can get a port put in. She will see me on March 27 and we will go over anti-emetic strategies and other supportive measures. She will also come to "chemotherapy school" hopefully this week.  [name] has a good understanding of the overall plan. She agrees with it. She knows the goal of treatment in her cases cure. She will call for any problems that may develop before her next visit here.   Chauncey Cruel, MD   02/13/2014 7:09 PM

## 2014-02-14 ENCOUNTER — Encounter (HOSPITAL_COMMUNITY): Payer: Self-pay | Admitting: Pharmacy Technician

## 2014-02-14 ENCOUNTER — Other Ambulatory Visit (INDEPENDENT_AMBULATORY_CARE_PROVIDER_SITE_OTHER): Payer: Self-pay | Admitting: General Surgery

## 2014-02-15 ENCOUNTER — Telehealth: Payer: Self-pay | Admitting: Oncology

## 2014-02-15 NOTE — Telephone Encounter (Signed)
, °

## 2014-02-27 ENCOUNTER — Encounter (HOSPITAL_COMMUNITY): Payer: Self-pay

## 2014-02-27 ENCOUNTER — Encounter (HOSPITAL_COMMUNITY)
Admission: RE | Admit: 2014-02-27 | Discharge: 2014-02-27 | Disposition: A | Discharge status: Home / Self Care | Payer: BC Managed Care – PPO | Source: Ambulatory Visit | Attending: Anesthesiology | Admitting: Anesthesiology

## 2014-02-27 ENCOUNTER — Encounter (HOSPITAL_COMMUNITY)
Admission: RE | Admit: 2014-02-27 | Discharge: 2014-02-27 | Disposition: A | Discharge status: Home / Self Care | Payer: BC Managed Care – PPO | Source: Ambulatory Visit | Attending: General Surgery | Admitting: General Surgery

## 2014-02-27 HISTORY — DX: Malignant (primary) neoplasm, unspecified: C80.1

## 2014-02-27 HISTORY — DX: Personal history of other diseases of the digestive system: Z87.19

## 2014-02-27 LAB — CBC
HCT: 38.6 % (ref 36.0–46.0)
Hemoglobin: 13.9 g/dL (ref 12.0–15.0)
MCH: 32.6 pg (ref 26.0–34.0)
MCHC: 36 g/dL (ref 30.0–36.0)
MCV: 90.6 fL (ref 78.0–100.0)
Platelets: 255 10*3/uL (ref 150–400)
RBC: 4.26 MIL/uL (ref 3.87–5.11)
RDW: 12.3 % (ref 11.5–15.5)
WBC: 7 10*3/uL (ref 4.0–10.5)

## 2014-02-27 LAB — BASIC METABOLIC PANEL
BUN: 12 mg/dL (ref 6–23)
CO2: 27 mEq/L (ref 19–32)
Calcium: 9.3 mg/dL (ref 8.4–10.5)
Chloride: 100 mEq/L (ref 96–112)
Creatinine, Ser: 0.78 mg/dL (ref 0.50–1.10)
GFR calc Af Amer: >90 mL/min (ref >90)
GFR calc non Af Amer: 89 mL/min — ABNORMAL LOW (ref >90)
Glucose, Bld: 73 mg/dL (ref 70–99)
Potassium: 3.4 mEq/L — ABNORMAL LOW (ref 3.7–5.3)
Sodium: 140 mEq/L (ref 137–147)

## 2014-02-27 MED ORDER — CEFAZOLIN SODIUM-DEXTROSE 2-3 GM-% IV SOLR
2.0000 g | INTRAVENOUS | Status: AC
  Administered 2014-02-28: 2 g via INTRAVENOUS
  Filled 2014-02-27: qty 50

## 2014-02-27 MED ORDER — CHLORHEXIDINE GLUCONATE 4 % EX LIQD
1.0000 "application " | Freq: Once | CUTANEOUS | Status: DC
  Filled 2014-02-27: qty 15

## 2014-02-27 NOTE — Pre-Procedure Instructions (Addendum)
Sheri Arellano  02/27/2014   Your procedure is scheduled on:    Report to [facility] short stay admitting 02/28/14 AM.at 1115  Call this number if you have problems the morning of surgery: [phone]   Remember:   Do not eat food or drink liquids after midnight.   Take these medicines the morning of surgery with A SIP OF WATER: clonazepam,nitro if needed, metoprolol,protonix, zoloft       STOP all herbel meds, nsaids (aleve,naproxen,advil,ibuprofen) now including vitamins,aspirin   Do not wear jewelry, make-up or nail polish.  Do not wear lotions, powders, or perfumes. You may wear deodorant.  Do not shave 48 hours prior to surgery. Men may shave face and neck.  Do not bring valuables to the hospital.  [facility] is not responsible                  for any belongings or valuables.               Contacts, dentures or bridgework may not be worn into surgery.  Leave suitcase in the car. After surgery it may be brought to your room.  For patients admitted to the hospital, discharge time is determined by your                treatment team.               Patients discharged the day of surgery will not be allowed to drive  home.  Name and phone number of your driver:   Special Instructions:  Special Instructions: [place] - Preparing for Surgery  Before surgery, you can play an important role.  Because skin is not sterile, your skin needs to be as free of germs as possible.  You can reduce the number of germs on you skin by washing with CHG (chlorahexidine gluconate) soap before surgery.  CHG is an antiseptic cleaner which kills germs and bonds with the skin to continue killing germs even after washing.  Please DO NOT use if you have an allergy to CHG or antibacterial soaps.  If your skin becomes reddened/irritated stop using the CHG and inform your nurse when you arrive at Short Stay.  Do not shave (including legs and underarms) for at least 48 hours prior to the first CHG shower.  You  may shave your face.  Please follow these instructions carefully:   1.  Shower with CHG Soap the night before surgery and the morning of Surgery.  2.  If you choose to wash your hair, wash your hair first as usual with your normal shampoo.  3.  After you shampoo, rinse your hair and body thoroughly to remove the Shampoo.  4.  Use CHG as you would any other liquid soap.  You can apply chg directly  to the skin and wash gently with scrungie or a clean washcloth.  5.  Apply the CHG Soap to your body ONLY FROM THE NECK DOWN.  Do not use on open wounds or open sores.  Avoid contact with your eyes ears, mouth and genitals (private parts).  Wash genitals (private parts)       with your normal soap.  6.  Wash thoroughly, paying special attention to the area where your surgery will be performed.  7.  Thoroughly rinse your body with warm water from the neck down.  8.  DO NOT shower/wash with your normal soap after using and rinsing off the CHG Soap.  9.  Pat yourself  dry with a clean towel.            10.  Wear clean pajamas.            11.  Place clean sheets on your bed the night of your first shower and do not sleep with pets.  Day of Surgery  Do not apply any lotions/deodorants the morning of surgery.  Please wear clean clothes to the hospital/surgery center.   Please read over the following fact sheets that you were given: Pain Booklet, Coughing and Deep Breathing and Surgical Site Infection Prevention

## 2014-02-27 NOTE — H&P (Signed)
History:  This patient underwent left partial mastectomy and sentinel node biopsy on 01/16/2014. Preoperative imaging studies and biopsy suggested that she had a 4 mm triple negative breast cancer. I did a transverse elliptical incision because the tumor was so close the skin.  I got widely negative margins. This is a triple negative for breast cancer, invasive ductal, sentinel nose negative, Ki 67 72%. Pathology report revealed that this was a larger tumor, 1.2 cm, stage T1c, N0.  She's having no problems with wound healing and feels fine.  She saw Dr. Sondra Come yesterday, and he told her that she may need chemotherapy.. This was frustrating to her since preoperatively she had been told she probably would not need chemotherapy. I explained to her that this was all based on the tumor diameter, and that the imaging studies had underestimated the tumor diameter. She is scheduled to see Dr. Jana Hakim this coming Monday.    Exam:  Patient looks very good. In no distress.  Left breast reveals radially oriented incision at the 3:00 position. Healing normally. Left axilla incision healing normally. No hematoma. No infection. Almost no thickening of the tissues. Cosmesis is very good.     Assessment:  Invasive ductal carcinoma left breast, 3:00 position, TNBC, 1.2 cm diameter, stage T1c, , N0.  Recovering uneventfully 3 weeks postop left partial mastectomy and sentinel node biopsy    Plan:  I advised her also that Dr. Jana Hakim will be discussing the pros and cons of chemotherapy with her next week.  I told her that if necessary, we will be happy to put a Port-A-Cath in her. I discussed the indications, techniques and risks of this procedure with her and showed her a Port-A-Cath.  Activity discussed.  Otherwise I will see her in 5 months for surveillance.      Edsel Petrin. Dalbert Batman, M.D., [facility]  General and Minimally invasive Surgery  Breast and Colorectal Surgery   Office: [phone]  Pager: [phone]

## 2014-02-27 NOTE — Progress Notes (Signed)
Last visit to cardiac dr 2/14. No problems. rx metoprolol taken only if needed. inst pt to take am of surgery

## 2014-02-28 ENCOUNTER — Encounter (HOSPITAL_COMMUNITY)
Admission: RE | Disposition: A | Discharge status: Home / Self Care | Payer: Self-pay | Source: Ambulatory Visit | Attending: General Surgery

## 2014-02-28 ENCOUNTER — Encounter (HOSPITAL_COMMUNITY): Payer: Self-pay | Admitting: *Deleted

## 2014-02-28 ENCOUNTER — Ambulatory Visit (HOSPITAL_COMMUNITY)
Admission: RE | Admit: 2014-02-28 | Discharge: 2014-02-28 | Disposition: A | Discharge status: Home / Self Care | Payer: BC Managed Care – PPO | Source: Ambulatory Visit | Attending: General Surgery | Admitting: General Surgery

## 2014-02-28 ENCOUNTER — Ambulatory Visit (HOSPITAL_COMMUNITY): Payer: BC Managed Care – PPO

## 2014-02-28 ENCOUNTER — Encounter (HOSPITAL_COMMUNITY): Payer: BC Managed Care – PPO | Admitting: Vascular Surgery

## 2014-02-28 ENCOUNTER — Ambulatory Visit (HOSPITAL_COMMUNITY): Payer: BC Managed Care – PPO | Admitting: Anesthesiology

## 2014-02-28 DIAGNOSIS — I252 Old myocardial infarction: Secondary | ICD-10-CM | POA: Insufficient documentation

## 2014-02-28 DIAGNOSIS — Z87891 Personal history of nicotine dependence: Secondary | ICD-10-CM | POA: Insufficient documentation

## 2014-02-28 DIAGNOSIS — C50412 Malignant neoplasm of upper-outer quadrant of left female breast: Secondary | ICD-10-CM

## 2014-02-28 DIAGNOSIS — Z01818 Encounter for other preprocedural examination: Secondary | ICD-10-CM | POA: Insufficient documentation

## 2014-02-28 DIAGNOSIS — K219 Gastro-esophageal reflux disease without esophagitis: Secondary | ICD-10-CM | POA: Insufficient documentation

## 2014-02-28 DIAGNOSIS — Z171 Estrogen receptor negative status [ER-]: Secondary | ICD-10-CM | POA: Diagnosis present

## 2014-02-28 DIAGNOSIS — C50919 Malignant neoplasm of unspecified site of unspecified female breast: Secondary | ICD-10-CM

## 2014-02-28 DIAGNOSIS — Z01812 Encounter for preprocedural laboratory examination: Secondary | ICD-10-CM | POA: Insufficient documentation

## 2014-02-28 DIAGNOSIS — K449 Diaphragmatic hernia without obstruction or gangrene: Secondary | ICD-10-CM | POA: Insufficient documentation

## 2014-02-28 DIAGNOSIS — F411 Generalized anxiety disorder: Secondary | ICD-10-CM | POA: Insufficient documentation

## 2014-02-28 DIAGNOSIS — I498 Other specified cardiac arrhythmias: Secondary | ICD-10-CM | POA: Insufficient documentation

## 2014-02-28 HISTORY — PX: PORTACATH PLACEMENT: SHX2246

## 2014-02-28 SURGERY — INSERTION, TUNNELED CENTRAL VENOUS DEVICE, WITH PORT
Anesthesia: General | Site: Chest | Laterality: Right

## 2014-02-28 MED ORDER — DEXAMETHASONE SODIUM PHOSPHATE 10 MG/ML IJ SOLN
INTRAMUSCULAR | Status: DC | PRN
  Administered 2014-02-28: 8 mg via INTRAVENOUS

## 2014-02-28 MED ORDER — 0.9 % SODIUM CHLORIDE (POUR BTL) OPTIME
TOPICAL | Status: DC | PRN
  Administered 2014-02-28: 1000 mL

## 2014-02-28 MED ORDER — LIDOCAINE-EPINEPHRINE (PF) 1 %-1:200000 IJ SOLN
INTRAMUSCULAR | Status: DC | PRN
  Administered 2014-02-28: 7 mL

## 2014-02-28 MED ORDER — PROPOFOL INFUSION 10 MG/ML OPTIME
INTRAVENOUS | Status: DC | PRN
  Administered 2014-02-28: 160 mL via INTRAVENOUS

## 2014-02-28 MED ORDER — HEPARIN SODIUM (PORCINE) 5000 UNIT/ML IJ SOLN
INTRAMUSCULAR | Status: DC | PRN
  Administered 2014-02-28: 13:00:00

## 2014-02-28 MED ORDER — FENTANYL CITRATE 0.05 MG/ML IJ SOLN
INTRAMUSCULAR | Status: AC
  Filled 2014-02-28: qty 5

## 2014-02-28 MED ORDER — FENTANYL CITRATE 0.05 MG/ML IJ SOLN
INTRAMUSCULAR | Status: DC | PRN
Start: 2014-02-28 — End: 2014-02-28
  Administered 2014-02-28 (×3): 50 ug via INTRAVENOUS

## 2014-02-28 MED ORDER — METOCLOPRAMIDE HCL 5 MG/ML IJ SOLN: 10.0000 mg | Freq: Once | INTRAMUSCULAR | Status: DC | PRN

## 2014-02-28 MED ORDER — DEXMEDETOMIDINE HCL IN NACL 200 MCG/50ML IV SOLN
INTRAVENOUS | Status: AC
  Filled 2014-02-28: qty 100

## 2014-02-28 MED ORDER — LIDOCAINE HCL (CARDIAC) 20 MG/ML IV SOLN
INTRAVENOUS | Status: AC
Start: 2014-02-28 — End: 2014-02-28
  Filled 2014-02-28: qty 5

## 2014-02-28 MED ORDER — OXYCODONE HCL 5 MG/5ML PO SOLN: 5.0000 mg | Freq: Once | ORAL | Status: DC | PRN

## 2014-02-28 MED ORDER — ONDANSETRON HCL 4 MG/2ML IJ SOLN
INTRAMUSCULAR | Status: AC
  Filled 2014-02-28: qty 2

## 2014-02-28 MED ORDER — OXYCODONE HCL 5 MG PO TABS: 5.0000 mg | ORAL_TABLET | Freq: Once | ORAL | Status: DC | PRN

## 2014-02-28 MED ORDER — MIDAZOLAM HCL 5 MG/5ML IJ SOLN
INTRAMUSCULAR | Status: DC | PRN
  Administered 2014-02-28: 2 mg via INTRAVENOUS

## 2014-02-28 MED ORDER — SUCCINYLCHOLINE CHLORIDE 20 MG/ML IJ SOLN
INTRAMUSCULAR | Status: AC
  Filled 2014-02-28: qty 1

## 2014-02-28 MED ORDER — LACTATED RINGERS IV SOLN
INTRAVENOUS | Status: DC
  Administered 2014-02-28 (×3): via INTRAVENOUS

## 2014-02-28 MED ORDER — HEPARIN SOD (PORK) LOCK FLUSH 100 UNIT/ML IV SOLN
INTRAVENOUS | Status: DC | PRN
  Administered 2014-02-28: 400 [IU]

## 2014-02-28 MED ORDER — STERILE WATER FOR INJECTION IJ SOLN
INTRAMUSCULAR | Status: AC
  Filled 2014-02-28: qty 10

## 2014-02-28 MED ORDER — PROPOFOL 10 MG/ML IV BOLUS
INTRAVENOUS | Status: AC
  Filled 2014-02-28: qty 20

## 2014-02-28 MED ORDER — EPHEDRINE SULFATE 50 MG/ML IJ SOLN
INTRAMUSCULAR | Status: DC | PRN
  Administered 2014-02-28: 5 mg via INTRAVENOUS
  Administered 2014-02-28 (×2): 10 mg via INTRAVENOUS

## 2014-02-28 MED ORDER — MIDAZOLAM HCL 2 MG/2ML IJ SOLN
INTRAMUSCULAR | Status: AC
  Filled 2014-02-28: qty 2

## 2014-02-28 MED ORDER — HYDROMORPHONE HCL PF 1 MG/ML IJ SOLN: 0.2500 mg | INTRAMUSCULAR | Status: DC | PRN

## 2014-02-28 MED ORDER — EPHEDRINE SULFATE 50 MG/ML IJ SOLN
INTRAMUSCULAR | Status: AC
  Filled 2014-02-28: qty 1

## 2014-02-28 MED ORDER — ONDANSETRON HCL 4 MG/2ML IJ SOLN
INTRAMUSCULAR | Status: DC | PRN
  Administered 2014-02-28: 4 mg via INTRAVENOUS

## 2014-02-28 MED ORDER — LIDOCAINE HCL (CARDIAC) 20 MG/ML IV SOLN
INTRAVENOUS | Status: DC | PRN
  Administered 2014-02-28: 50 mg via INTRAVENOUS

## 2014-02-28 MED ORDER — HEPARIN SOD (PORK) LOCK FLUSH 100 UNIT/ML IV SOLN
INTRAVENOUS | Status: AC
  Filled 2014-02-28: qty 5

## 2014-02-28 MED ORDER — HYDROCODONE-ACETAMINOPHEN 5-325 MG PO TABS: 1.0000 | ORAL_TABLET | Freq: Four times a day (QID) | ORAL | Status: DC | PRN

## 2014-02-28 SURGICAL SUPPLY — 61 items
ADH SKN CLS APL DERMABOND .7 (GAUZE/BANDAGES/DRESSINGS) ×1
BAG DECANTER FOR FLEXI CONT (MISCELLANEOUS) ×2 IMPLANT
BLADE SURG 10 STRL SS (BLADE) ×2 IMPLANT
BLADE SURG 15 STRL LF DISP TIS (BLADE) ×1 IMPLANT
BLADE SURG 15 STRL SS (BLADE) ×2
BLADE SURG ROTATE 9660 (MISCELLANEOUS) IMPLANT
CANISTER SUCTION 2500CC (MISCELLANEOUS) IMPLANT
CHLORAPREP W/TINT 10.5 ML (MISCELLANEOUS) ×2 IMPLANT
COVER PROBE W GEL 5X96 (DRAPES) IMPLANT
COVER SURGICAL LIGHT HANDLE (MISCELLANEOUS) ×2 IMPLANT
CRADLE DONUT ADULT HEAD (MISCELLANEOUS) ×2 IMPLANT
DECANTER SPIKE VIAL GLASS SM (MISCELLANEOUS) ×1 IMPLANT
DERMABOND ADVANCED (GAUZE/BANDAGES/DRESSINGS) ×1
DERMABOND ADVANCED .7 DNX12 (GAUZE/BANDAGES/DRESSINGS) ×1 IMPLANT
DRAPE C-ARM 42X72 X-RAY (DRAPES) ×2 IMPLANT
DRAPE LAPAROSCOPIC ABDOMINAL (DRAPES) ×2 IMPLANT
DRAPE UTILITY 15X26 W/TAPE STR (DRAPE) ×4 IMPLANT
ELECT CAUTERY BLADE 6.4 (BLADE) ×2 IMPLANT
ELECT REM PT RETURN 9FT ADLT (ELECTROSURGICAL) ×2
ELECTRODE REM PT RTRN 9FT ADLT (ELECTROSURGICAL) ×1 IMPLANT
GAUZE SPONGE 4X4 16PLY XRAY LF (GAUZE/BANDAGES/DRESSINGS) ×2 IMPLANT
GLOVE BIOGEL PI IND STRL 6.5 (GLOVE) IMPLANT
GLOVE BIOGEL PI IND STRL 7.0 (GLOVE) IMPLANT
GLOVE BIOGEL PI INDICATOR 6.5 (GLOVE) ×1
GLOVE BIOGEL PI INDICATOR 7.0 (GLOVE) ×2
GLOVE ECLIPSE 6.0 STRL STRAW (GLOVE) ×1 IMPLANT
GLOVE EUDERMIC 7 POWDERFREE (GLOVE) ×2 IMPLANT
GOWN STRL REUS W/ TWL LRG LVL3 (GOWN DISPOSABLE) ×1 IMPLANT
GOWN STRL REUS W/ TWL XL LVL3 (GOWN DISPOSABLE) ×1 IMPLANT
GOWN STRL REUS W/TWL LRG LVL3 (GOWN DISPOSABLE) ×2
GOWN STRL REUS W/TWL XL LVL3 (GOWN DISPOSABLE) ×6
INTRODUCER 13FR (MISCELLANEOUS) IMPLANT
INTRODUCER COOK 11FR (CATHETERS) IMPLANT
KIT BASIN OR (CUSTOM PROCEDURE TRAY) ×2 IMPLANT
KIT PORT POWER 8FR ISP CVUE (Catheter) ×1 IMPLANT
KIT PORT POWER 9.6FR MRI PREA (Catheter) IMPLANT
KIT PORT POWER ISP 8FR (Catheter) IMPLANT
KIT POWER CATH 8FR (Catheter) IMPLANT
KIT ROOM TURNOVER OR (KITS) ×2 IMPLANT
NDL HYPO 25GX1X1/2 BEV (NEEDLE) ×1 IMPLANT
NEEDLE HYPO 25GX1X1/2 BEV (NEEDLE) ×2 IMPLANT
NS IRRIG 1000ML POUR BTL (IV SOLUTION) ×2 IMPLANT
PACK SURGICAL SETUP 50X90 (CUSTOM PROCEDURE TRAY) ×2 IMPLANT
PAD ARMBOARD 7.5X6 YLW CONV (MISCELLANEOUS) ×2 IMPLANT
PENCIL BUTTON HOLSTER BLD 10FT (ELECTRODE) ×2 IMPLANT
SET INTRODUCER 12FR PACEMAKER (SHEATH) IMPLANT
SET SHEATH INTRODUCER 10FR (MISCELLANEOUS) IMPLANT
SHEATH COOK PEEL AWAY SET 9F (SHEATH) IMPLANT
SURGILUBE 3G PEEL PACK STRL (MISCELLANEOUS) IMPLANT
SUT MNCRL AB 4-0 PS2 18 (SUTURE) ×2 IMPLANT
SUT PROLENE 2 0 CT2 30 (SUTURE) ×2 IMPLANT
SUT VIC AB 3-0 SH 18 (SUTURE) ×2 IMPLANT
SYR 20CC LL (SYRINGE) ×2 IMPLANT
SYR 5ML LUER SLIP (SYRINGE) ×2 IMPLANT
SYR BULB 3OZ (MISCELLANEOUS) ×2 IMPLANT
SYR CONTROL 10ML LL (SYRINGE) ×3 IMPLANT
SYRINGE 10CC LL (SYRINGE) ×2 IMPLANT
TOWEL OR 17X24 6PK STRL BLUE (TOWEL DISPOSABLE) ×2 IMPLANT
TOWEL OR 17X26 10 PK STRL BLUE (TOWEL DISPOSABLE) ×2 IMPLANT
TUBE CONNECTING 12X1/4 (SUCTIONS) IMPLANT
YANKAUER SUCT BULB TIP NO VENT (SUCTIONS) ×1 IMPLANT

## 2014-02-28 NOTE — Op Note (Signed)
Patient Name:           Sheri Arellano   Date of Surgery:        02/28/2014  Pre op Diagnosis:      Triple negative breast cancer, invasive ductal, left breast, pathologic stage T1c, N0.  Post op Diagnosis:    Same  Procedure:                 Insertion of power port ClearView 8 French tunneled venous vascular access device Use of fluoroscopy for guidance and positioned in  Surgeon:                     Edsel Petrin. Dalbert Batman, M.D., FACS  Assistant:                      None  Operative Indications:   This patient underwent left partial mastectomy and sentinel node biopsy on 01/16/2014. Preoperative imaging studies and biopsy suggested that she had a 4 mm triple negative breast cancer. I did a transverse elliptical incision because the tumor was so close the skin.  I got widely negative margins. This is a triple negative for breast cancer, invasive ductal, sentinel node negative, Ki 67 72%. Pathology report revealed that this was a larger tumor, 1.2 cm, larger than predicted by initial imaging studies,stage T1c, N0.Marland Kitchen  She has seen Dr. Gunnar Bulla Magrinat and Dr. Gery Pray. She has been advised to have adjuvant chemotherapy because of the size of her tumor and the fact that it is a triple negative breast cancer. She is brought to the operating room electively for Port-A-Cath insertion to facilitate her chemotherapy.   Operative Findings:       The catheter was placed through the right subclavian vein. Intraoperative imaging studies looked good. The catheter flushed easily and had excellent blood return.  Procedure in Detail:          Following the induction of general endotracheal anesthesia the patient's chest and neck were prepped and draped in a sterile fashion. Arms had been tucked at her sides. Surgical time out was performed and intravenous antibiotics were given. 1% Xylocaine with epinephrine was used as a local infiltration anesthetic. A right subclavian venipuncture was performed with a single pass  and a guidewire inserted into the superior vena cava under fluoroscopic guidance. A small incision was made at the wire insertion site. Using the C-arm I marked a template of the chest wall to guide catheter length and insertion, so that the catheter would be in the superior vena cava at the right atrial junction. I made a short transverse incision about 3 cm below the mid clavicle. A subcutaneous pocket was created at the level of the pectoralis fascia. Using a tunneling device I passed the catheter from the wire insertion site to the port pocket site. Using the template on the chest wall I measured the catheter and cut it 19 cm in length. The catheter was secured to the port with the locking device and it was flushed with heparinized saline. The port was sutured to the pectoralis fascia with 3 interrupted sutures of 2-0 Prolene. The dilator and peel-away sheath assembly were inserted over the guidewire into the central venous circulation and the dilator and guidewire removed. The catheter was threaded through the peel-away sheath and the peel-away sheath was removed. The catheter flushed easily and had excellent blood return. I flushed the catheter with concentrated heparin. Hemostasis was good. Subcutaneous tissue was closed with  3-0 Vicryl sutures and skin incisions closed with subcuticular sutures of 4-0 Monocryl and Dermabond. The patient tolerated the procedure well and was taken to PACU in stable condition. EBL 10 cc. Counts correct. Complications none.     Edsel Petrin. Dalbert Batman, M.D., FACS General and Minimally Invasive Surgery Breast and Colorectal Surgery  02/28/2014 2:47 PM

## 2014-02-28 NOTE — Anesthesia Procedure Notes (Signed)
Procedure Name: LMA Insertion Date/Time: 02/28/2014 2:01 PM Performed by: Maude Leriche D Pre-anesthesia Checklist: Patient identified, Emergency Drugs available, Suction available, Patient being monitored and Timeout performed Patient Re-evaluated:Patient Re-evaluated prior to inductionOxygen Delivery Method: Circle system utilized Preoxygenation: Pre-oxygenation with 100% oxygen Intubation Type: IV induction Ventilation: Mask ventilation without difficulty LMA: LMA inserted LMA Size: 4.0 Tube type: Oral Number of attempts: 1 Placement Confirmation: positive ETCO2 and breath sounds checked- equal and bilateral Tube secured with: Tape Dental Injury: Teeth and Oropharynx as per pre-operative assessment

## 2014-02-28 NOTE — Discharge Instructions (Signed)
Implanted Port Insertion, Care After Refer to this sheet in the next few weeks. These instructions provide you with information on caring for yourself after your procedure. Your health care provider may also give you more specific instructions. Your treatment has been planned according to current medical practices, but problems sometimes occur. Call your health care provider if you have any problems or questions after your procedure. WHAT TO EXPECT AFTER THE PROCEDURE After your procedure, it is typical to have the following:   Discomfort at the port insertion site. Ice packs to the area will help.  Bruising on the skin over the port. This will subside in 3 4 days. HOME CARE INSTRUCTIONS  After your port is placed, you will get a manufacturer's information card. The card has information about your port. Keep this card with you at all times.   Know what kind of port you have. There are many types of ports available.   Wear a medical alert bracelet in case of an emergency. This can help alert health care workers that you have a port.   The port can stay in for as long as your health care provider believes it is necessary.   A home health care nurse may give medicines and take care of the port.   You or a family member can get special training and directions for giving medicine and taking care of the port at home.  SEEK MEDICAL CARE IF:  Your port does not flush or you are unable to get a blood return.   SEEK IMMEDIATE MEDICAL CARE IF:  You have new fluid or pus coming from your incision.   You notice a bad smell coming from your incision site.   You have swelling, pain, or more redness at the incision or port site.   You have a fever or chills.   You have chest pain or shortness of breath. Document Released: 09/14/2013 Document Reviewed: 08/01/2013 [facility] Patient Information 2014 [place], [place].       Implanted [facility] Guide An implanted port is a type of  central line that is placed under the skin. Central lines are used to provide IV access when treatment or nutrition needs to be given through a person's veins. Implanted ports are used for long-term IV access. An implanted port may be placed because:   You need IV medicine that would be irritating to the small veins in your hands or arms.   You need long-term IV medicines, such as antibiotics.   You need IV nutrition for a long period.   You need frequent blood draws for lab tests.   You need dialysis.  Implanted ports are usually placed in the chest area, but they can also be placed in the upper arm, the abdomen, or the leg. An implanted port has two main parts:   Reservoir. The reservoir is round and will appear as a small, raised area under your skin. The reservoir is the part where a needle is inserted to give medicines or draw blood.   Catheter. The catheter is a thin, flexible tube that extends from the reservoir. The catheter is placed into a large vein. Medicine that is inserted into the reservoir goes into the catheter and then into the vein.  HOW WILL I CARE FOR MY INCISION SITE? Do not get the incision site wet. Bathe or shower as directed by your health care provider.  HOW IS MY PORT ACCESSED? Special steps must be taken to access the port:  Before the port is accessed, a numbing cream can be placed on the skin. This helps numb the skin over the port site.   Your health care provider uses a sterile technique to access the port.  Your health care provider must put on a mask and sterile gloves.  The skin over your port is cleaned carefully with an antiseptic and allowed to dry.  The port is gently pinched between sterile gloves, and a needle is inserted into the port.  Only "non-coring" port needles should be used to access the port. Once the port is accessed, a blood return should be checked. This helps ensure that the port is in the vein and is not clogged.   If  your port needs to remain accessed for a constant infusion, a clear (transparent) bandage will be placed over the needle site. The bandage and needle will need to be changed every week, or as directed by your health care provider.   Keep the bandage covering the needle clean and dry. Do not get it wet. Follow your health care provider's instructions on how to take a shower or bath while the port is accessed.   If your port does not need to stay accessed, no bandage is needed over the port.  WHAT IS FLUSHING? Flushing helps keep the port from getting clogged. Follow your health care provider's instructions on how and when to flush the port. Ports are usually flushed with saline solution or a medicine called heparin. The need for flushing will depend on how the port is used.   If the port is used for intermittent medicines or blood draws, the port will need to be flushed:   After medicines have been given.   After blood has been drawn.   As part of routine maintenance.   If a constant infusion is running, the port may not need to be flushed.  HOW LONG WILL MY PORT STAY IMPLANTED? The port can stay in for as long as your health care provider thinks it is needed. When it is time for the port to come out, surgery will be done to remove it. The procedure is similar to the one performed when the port was put in.  WHEN SHOULD I SEEK IMMEDIATE MEDICAL CARE? When you have an implanted port, you should seek immediate medical care if:   You notice a bad smell coming from the incision site.   You have swelling, redness, or drainage at the incision site.   You have more swelling or pain at the port site or the surrounding area.   You have a fever that is not controlled with medicine. Document Released: 11/24/2005 Document Revised: 09/14/2013 Document Reviewed: 08/01/2013 [facility] Patient Information 2014 [place].    What to eat:  For your first meals, you should eat lightly;  only small meals initially.  If you do not have nausea, you may eat larger meals.  Avoid spicy, greasy and heavy food.    General Anesthesia, Adult, Care After  Refer to this sheet in the next few weeks. These instructions provide you with information on caring for yourself after your procedure. Your health care provider may also give you more specific instructions. Your treatment has been planned according to current medical practices, but problems sometimes occur. Call your health care provider if you have any problems or questions after your procedure.  WHAT TO EXPECT AFTER THE PROCEDURE  After the procedure, it is typical to experience:  Sleepiness.  Nausea and vomiting.  HOME CARE INSTRUCTIONS  For the first 24 hours after general anesthesia:  Have a responsible person with you.  Do not drive a car. If you are alone, do not take public transportation.  Do not drink alcohol.  Do not take medicine that has not been prescribed by your health care provider.  Do not sign important papers or make important decisions.  You may resume a normal diet and activities as directed by your health care provider.  Change bandages (dressings) as directed.  If you have questions or problems that seem related to general anesthesia, call the hospital and ask for the anesthetist or anesthesiologist on call. SEEK MEDICAL CARE IF:  You have nausea and vomiting that continue the day after anesthesia.  You develop a rash. SEEK IMMEDIATE MEDICAL CARE IF:  You have difficulty breathing.  You have chest pain.  You have any allergic problems. Document Released: 03/02/2001 Document Revised: 07/27/2013 Document Reviewed: 06/09/2013  [facility] Patient Information 2014 [place], [place].

## 2014-02-28 NOTE — Interval H&P Note (Signed)
History and Physical Interval Note:  02/28/2014 1:25 PM  Sheri Arellano  has presented today for surgery, with the diagnosis of breast cancer  The various methods of treatment have been discussed with the patient and family. After consideration of risks, benefits and other options for treatment, the patient has consented to  Procedure(s): INSERTION PORT-A-CATH (N/A) as a surgical intervention .  The patient's history has been reviewed, patient examined, no change in status, stable for surgery.  I have reviewed the patient's chart and labs.  Questions were answered to the patient's satisfaction.     [name]

## 2014-02-28 NOTE — Preoperative (Signed)
Beta Blockers   Lopressor 25mg  PO @    on

## 2014-02-28 NOTE — Transfer of Care (Signed)
Immediate Anesthesia Transfer of Care Note  Patient: Sheri Arellano  Procedure(s) Performed: Procedure(s): INSERTION PORT-A-CATH (Right)  Patient Location: PACU  Anesthesia Type:General  Level of Consciousness: sedated  Airway & Oxygen Therapy: Patient Spontanous Breathing and Patient connected to nasal cannula oxygen  Post-op Assessment: Report given to PACU RN and Post -op Vital signs reviewed and stable  Post vital signs: Reviewed and stable  Complications: No apparent anesthesia complications

## 2014-02-28 NOTE — Anesthesia Postprocedure Evaluation (Signed)
  Anesthesia Post-op Note  Patient: Sheri Arellano  Procedure(s) Performed: Procedure(s): INSERTION PORT-A-CATH (Right)  Patient Location: PACU  Anesthesia Type:General  Level of Consciousness: awake, alert  and oriented  Airway and Oxygen Therapy: Patient Spontanous Breathing  Post-op Pain: none  Post-op Assessment: Post-op Vital signs reviewed, Patient's Cardiovascular Status Stable, Respiratory Function Stable, Patent Airway, No signs of Nausea or vomiting and Pain level controlled  Post-op Vital Signs: Reviewed and stable  Complications: No apparent anesthesia complications

## 2014-02-28 NOTE — Anesthesia Preprocedure Evaluation (Addendum)
Anesthesia Evaluation  Patient identified by MRN, date of birth, ID band Patient awake    Reviewed: Allergy & Precautions, H&P , NPO status , Patient's Chart, lab work & pertinent test results, reviewed documented beta blocker date and time   Airway Mallampati: II TM Distance: >3 FB Neck ROM: full    Dental  (+) Teeth Intact, Dental Advisory Given   Pulmonary neg pulmonary ROS, former smoker,  02-27-14 Chest x-ray  Surgical clips project over the lateral aspect of the left breast and in the right upper quadrant of the abdomen.   IMPRESSION: There is no evidence of active cardiopulmonary disease.  breath sounds clear to auscultation        Cardiovascular + dysrhythmias Supra Ventricular Tachycardia Rhythm:regular  16-Jan-2014  Normal sinus rhythm Inferior infarct , age undetermined Possible Anterior infarct , age undetermined   Neuro/Psych Anxiety  Neuromuscular disease negative psych ROS   GI/Hepatic Neg liver ROS, hiatal hernia, GERD-  Medicated and Controlled,  Endo/Other  negative endocrine ROS  Renal/GU negative Renal ROS  negative genitourinary   Musculoskeletal   Abdominal   Peds  Hematology negative hematology ROS (+) anemia ,   Anesthesia Other Findings S/P breast CA -Left  Reproductive/Obstetrics negative OB ROS                         Anesthesia Physical Anesthesia Plan  ASA: II  Anesthesia Plan: General   Post-op Pain Management:    Induction: Intravenous  Airway Management Planned: LMA  Additional Equipment:   Intra-op Plan:   Post-operative Plan:   Informed Consent: I have reviewed the patients History and Physical, chart, labs and discussed the procedure including the risks, benefits and alternatives for the proposed anesthesia with the patient or authorized representative who has indicated his/her understanding and acceptance.   Dental Advisory Given and Dental  advisory given  Plan Discussed with: CRNA, Surgeon and Anesthesiologist  Anesthesia Plan Comments:        Anesthesia Quick Evaluation

## 2014-03-03 ENCOUNTER — Telehealth: Payer: Self-pay | Admitting: *Deleted

## 2014-03-03 ENCOUNTER — Encounter (HOSPITAL_COMMUNITY): Payer: Self-pay | Admitting: General Surgery

## 2014-03-03 ENCOUNTER — Ambulatory Visit: Payer: BC Managed Care – PPO | Admitting: Oncology

## 2014-03-03 NOTE — Telephone Encounter (Signed)
Pt called to this RN to state " I cannot make the appointment today because I didn't know about it until I was looking through my stuff and I have a meeting with a client at 5 today"  " I was hoping Dr Jana Hakim could work me in Monday and then he told me at the last visit my chemo would be on 4/6 but I don't even know how long it will last or anything "  This RN explained to pt appointment today is with MD only and then chemo appointments can be arranged per discussion.  Pt again stated she would be unable to make appt today.  This RN will review with MD and call pt with appropriate changes per recommendation.  MD reviewed pt's chart and stated need to start chemo ASAP due to triple negative status.  Pt should keep appt today to discuss treatment plan scheduled for Monday 3/30.  This RN attempted to call pt at given number of [phone] and with 2 attempts number rang and then one time went silent and 2nd time went to a busy number.  This RN will inform Breast Navigator of above.

## 2014-03-03 NOTE — Telephone Encounter (Signed)
See prior note for today with pt's call and discussion-  This RN attempted 5+ times to contact pt to discuss need for MD visit today with treatment to start Monday 03/06/2014- each time number rang then went to a busy signal.  This note with concern will be routed to MD and breast navigators for follow up and concern with pt understanding plan of care.

## 2014-03-04 MED ORDER — PROCHLORPERAZINE MALEATE 10 MG PO TABS: 10.0000 mg | ORAL_TABLET | Freq: Four times a day (QID) | ORAL | Status: DC | PRN

## 2014-03-04 MED ORDER — LIDOCAINE-PRILOCAINE 2.5-2.5 % EX CREA: 1.0000 "application " | TOPICAL_CREAM | CUTANEOUS | Status: DC | PRN

## 2014-03-04 MED ORDER — ONDANSETRON HCL 8 MG PO TABS: 8.0000 mg | ORAL_TABLET | Freq: Two times a day (BID) | ORAL | Status: DC

## 2014-03-04 MED ORDER — LORAZEPAM 0.5 MG PO TABS
0.5000 mg | ORAL_TABLET | Freq: Four times a day (QID) | ORAL | Status: DC | PRN
Start: 2014-03-04 — End: 2014-05-16

## 2014-03-04 MED ORDER — DEXAMETHASONE 4 MG PO TABS: 8.0000 mg | ORAL_TABLET | Freq: Two times a day (BID) | ORAL | Status: DC

## 2014-03-05 NOTE — Progress Notes (Signed)
Patient failed to show for her pre chemo visit. I will try to see her the day of treatment but her therapy mayhave to be postponed.

## 2014-03-06 ENCOUNTER — Telehealth: Payer: Self-pay | Admitting: Oncology

## 2014-03-06 ENCOUNTER — Telehealth: Payer: Self-pay | Admitting: *Deleted

## 2014-03-06 ENCOUNTER — Other Ambulatory Visit: Payer: BC Managed Care – PPO

## 2014-03-06 ENCOUNTER — Ambulatory Visit (HOSPITAL_BASED_OUTPATIENT_CLINIC_OR_DEPARTMENT_OTHER): Payer: BC Managed Care – PPO | Admitting: Oncology

## 2014-03-06 ENCOUNTER — Ambulatory Visit: Payer: BC Managed Care – PPO

## 2014-03-06 VITALS — BP 113/79 | HR 66 | Temp 97.4°F | Resp 20 | Ht 62.5 in | Wt 153.4 lb

## 2014-03-06 DIAGNOSIS — E7800 Pure hypercholesterolemia, unspecified: Secondary | ICD-10-CM

## 2014-03-06 DIAGNOSIS — K623 Rectal prolapse: Secondary | ICD-10-CM

## 2014-03-06 DIAGNOSIS — C50419 Malignant neoplasm of upper-outer quadrant of unspecified female breast: Secondary | ICD-10-CM

## 2014-03-06 DIAGNOSIS — D649 Anemia, unspecified: Secondary | ICD-10-CM

## 2014-03-06 DIAGNOSIS — I498 Other specified cardiac arrhythmias: Secondary | ICD-10-CM

## 2014-03-06 DIAGNOSIS — C50412 Malignant neoplasm of upper-outer quadrant of left female breast: Secondary | ICD-10-CM

## 2014-03-06 DIAGNOSIS — Z171 Estrogen receptor negative status [ER-]: Secondary | ICD-10-CM

## 2014-03-06 DIAGNOSIS — F411 Generalized anxiety disorder: Secondary | ICD-10-CM

## 2014-03-06 MED ORDER — LIDOCAINE-PRILOCAINE 2.5-2.5 % EX CREA: 1.0000 "application " | TOPICAL_CREAM | CUTANEOUS | Status: DC | PRN

## 2014-03-06 NOTE — Telephone Encounter (Signed)
Sent letter to patient from Dr. Magrinat office. °

## 2014-03-06 NOTE — Telephone Encounter (Signed)
, °

## 2014-03-06 NOTE — Progress Notes (Signed)
[place]  Telephone:(336) [phone] Fax:(336) [phone]     ID: Sheri Arellano OB: 1951/06/18  MR#: [id number]  ATF#:[id number]  PCP: [name], MD GYN:  Olena Mater SU: Fanny Skates OTHER MD: Gery Pray, Crista Luria  CHIEF COMPLAINT: "I am very worried about nausea"  BREAST CANCER HISTORY:: Sheri Arellano had routine screening mammography at [facility] 11/21/2013 showing an irregular density in the left breast at the 1:00 position. Ultrasound was obtained 11/29/2013 and showed a 4 mm tolerate them live irregular mass in the left breast at the 2:00 position. This was hypoechoic. Biopsy of this area 12/12/2013 showed ([id number]) an invasive ductal carcinoma, grade 1 or 2, estrogen receptor 0%, progesterone receptor 0%, with no HER-2 amplification, the signals ratio being 1.09 and the number per cell be 1.95. The MIB-1-1 was 72%.  The patient subsequent history is as detailed below  INTERVAL HISTORY: Sheri Arellano returns today for followup of her breast cancer. She was supposed to be starting chemotherapy today, and we had all her appointments lined up, but she did not show last Friday for a visit with me so she could not take her antinausea medicines appropriately and no longer short of it is that she is now ready to start chemotherapy April 6. Since her last visit here she had her port placed. This is a little bit sore but otherwise "doing fine".   REVIEW OF SYSTEMS:  Sheri Arellano is more worried about nausea and vomiting than anything else. Her daughter-in-law I sent her some alternative medications which are supposedly for nausea, but I reassured Sheri Arellano that the pills wheeze use in this setting are very effective in the 95 or more of our patients never vomited in one time. Patient will have a queasy stomach for several days of course. She was cautioned her regarding weight gain with chemotherapy. She was encouraged to exercise. She had questions regarding alcohol use during  chemotherapy and whether she could continue doxycycline. We also discussed week issues. As far as any symptoms are concerned, however, aside from anxiety, a detailed review of systems today was noncontributory  PAST MEDICAL HISTORY: Past Medical History  Diagnosis Date  . Esophageal dysmotility   . Anxiety     probable panic attacks  . Insomnia   . GERD (gastroesophageal reflux disease)   . Hyperlipidemia   . Dysphagia   . Vitamin D deficiency   . Other malaise and fatigue   . SVT (supraventricular tachycardia)     documented-takes metoprolol as needed  . H/O hiatal hernia   . Cancer     breast  . Iron deficiency anemia     hx    PAST SURGICAL HISTORY: Past Surgical History  Procedure Laterality Date  . Carpal tunnel release      lt and rt  . Cholecystectomy  1999  . Partial mastectomy with needle localization and axillary sentinel lymph node bx Left 01/16/2014    Procedure: PARTIAL MASTECTOMY WITH NEEDLE LOCALIZATION AND AXILLARY SENTINEL LYMPH NODE BIOPSY;  Surgeon: [name], MD;  Location: [place];  Service: General;  Laterality: Left;  . Portacath placement Right 02/28/2014    Procedure: INSERTION PORT-A-CATH;  Surgeon: [name], MD;  Location: [place];  Service: General;  Laterality: Right;    FAMILY HISTORY Family History  Problem Relation Age of Onset  . Atrial fibrillation Mother   . Hyperlipidemia Mother   . Hypertension Mother   . Cancer Father     bladder  . Cancer  Maternal Aunt     breast   the patient's parents are living, both in their 6s. The patient had one brother and one no sisters. One of the patient's mothers 3 sisters was diagnosed with breast cancer before the age of 30. There is no history of ovarian cancer in the family.  GYNECOLOGIC HISTORY:  Menarche age 53, first live birth age 4, the patient is [place] P2. She underwent menopause at age 82, and has been on hormone replacement for the last 23 years (estradiol and  norethindrone most recently). 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 Sheri Arellano used to work for [employer]. Their son date lives in [place] where he owns an Electronics engineer and [employer] [place] [employer]. Son Sheri Arellano lives in [place], currently working for [employer]. The patient has 3 grandchildren. She is a Furniture conservator/restorer.    ADVANCED DIRECTIVES: In place   HEALTH MAINTENANCE: History  Substance Use Topics  . Smoking status: Former Smoker -- 1.50 packs/day for 8 years    Types: Cigarettes    Quit date: 01/22/1976  . Smokeless tobacco: Former Systems developer    Quit date: 02/16/1975  . Alcohol Use: 4.8 oz/week    1 Glasses of wine, 7 Shots of liquor per week     Comment: rarely     Colonoscopy:  PAP:  Bone density:  Lipid panel:  Allergies  Allergen Reactions  . Erythromycin Base Other (See Comments)    Stomach ache    Current Outpatient Prescriptions  Medication Sig Dispense Refill  . aspirin EC 81 MG tablet Take 81 mg by mouth daily.      . Cholecalciferol (VITAMIN D) 2000 UNITS tablet Take 2,000 Units by mouth daily.      . clonazePAM (KLONOPIN) 1 MG tablet Take 1 mg by mouth at bedtime as needed (sleep).       Marland Kitchen dexamethasone (DECADRON) 4 MG tablet Take 2 tablets (8 mg total) by mouth 2 (two) times daily. Start the day before Taxotere. Then again the day after chemo for 3 days.  30 tablet  1  . Fiber-Vitamins-Minerals (FIBERALL PO) Take 2 tablets by mouth every evening.       Marland Kitchen HYDROcodone-acetaminophen (NORCO) 5-325 MG per tablet Take 1-2 tablets by mouth every 6 (six) hours as needed.  30 tablet  0  . lidocaine-prilocaine (EMLA) cream Apply 1 application topically as needed.  30 g  0  . lidocaine-prilocaine (EMLA) cream Apply 1 application topically as needed. Apply to port area 1-2 hours before chemotherapy and cover with plastic wrap  30 g  0  . loratadine (CLARITIN) 10 MG tablet Take 10 mg by mouth daily.      Marland Kitchen  LORazepam (ATIVAN) 0.5 MG tablet Take 1 tablet (0.5 mg total) by mouth every 6 (six) hours as needed (Nausea or vomiting).  30 tablet  0  . metoprolol tartrate (LOPRESSOR) 25 MG tablet Take 25 mg by mouth as needed (tachycardia).       . nitroGLYCERIN (NITROSTAT) 0.4 MG SL tablet Place 0.4 mg under the tongue every 5 (five) minutes as needed (Esophageal spasms).       . ondansetron (ZOFRAN) 8 MG tablet Take 1 tablet (8 mg total) by mouth 2 (two) times daily. Start the day after chemo for 3 days. Then take as needed for nausea or vomiting.  30 tablet  1  . pantoprazole (PROTONIX) 40 MG tablet Take 40 mg by mouth daily.      Marland Kitchen  prochlorperazine (COMPAZINE) 10 MG tablet Take 1 tablet (10 mg total) by mouth every 6 (six) hours as needed (Nausea or vomiting).  30 tablet  1  . sertraline (ZOLOFT) 100 MG tablet Take 50 mg by mouth daily.       . simvastatin (ZOCOR) 40 MG tablet Take 20 mg by mouth at bedtime.        No current facility-administered medications for this visit.    OBJECTIVE: Middle-aged white woman who appears stated age 33 Vitals:   03/06/14 1007  BP: 113/79  Pulse: 66  Temp: 97.4 F (36.3 C)  Resp: 20     Body mass index is 27.59 kg/(m^2).    ECOG FS:0 - Asymptomatic  Ocular: Sclerae unicteric, pupils round and reactive Ear-nose-throat: Oropharynx clear, teeth in good repair Lymphatic: No cervical or supraclavicular adenopathy Lungs no rales or rhonchi, good excursion bilaterally Heart regular rate and rhythm, no murmur appreciated Abd soft, nontender, positive bowel sounds MSK no focal spinal tenderness, no joint edema Neuro: non-focal, well-oriented, positive affect Breasts: The patient did not take off her bra for today's exam  LAB RESULTS:  CMP     Component Value Date/Time   NA 140 02/27/2014 1420   NA 139 12/21/2013 1229   K 3.4* 02/27/2014 1420   K 4.1 12/21/2013 1229   CL 100 02/27/2014 1420   CO2 27 02/27/2014 1420   CO2 27 12/21/2013 1229   GLUCOSE 73  02/27/2014 1420   GLUCOSE 95 12/21/2013 1229   BUN 12 02/27/2014 1420   BUN 10.5 12/21/2013 1229   CREATININE 0.78 02/27/2014 1420   CREATININE 0.8 12/21/2013 1229   CALCIUM 9.3 02/27/2014 1420   CALCIUM 9.5 12/21/2013 1229   PROT 7.0 12/21/2013 1229   ALBUMIN 3.9 12/21/2013 1229   AST 12 12/21/2013 1229   ALT 11 12/21/2013 1229   ALKPHOS 52 12/21/2013 1229   BILITOT 0.43 12/21/2013 1229   GFRNONAA 89* 02/27/2014 1420   GFRAA >90 02/27/2014 1420    I No results found for this basename: SPEP,  UPEP,   kappa and lambda light chains    Lab Results  Component Value Date   WBC 7.0 02/27/2014   NEUTROABS 4.7 12/21/2013   HGB 13.9 02/27/2014   HCT 38.6 02/27/2014   MCV 90.6 02/27/2014   PLT 255 02/27/2014      Chemistry      Component Value Date/Time   NA 140 02/27/2014 1420   NA 139 12/21/2013 1229   K 3.4* 02/27/2014 1420   K 4.1 12/21/2013 1229   CL 100 02/27/2014 1420   CO2 27 02/27/2014 1420   CO2 27 12/21/2013 1229   BUN 12 02/27/2014 1420   BUN 10.5 12/21/2013 1229   CREATININE 0.78 02/27/2014 1420   CREATININE 0.8 12/21/2013 1229      Component Value Date/Time   CALCIUM 9.3 02/27/2014 1420   CALCIUM 9.5 12/21/2013 1229   ALKPHOS 52 12/21/2013 1229   AST 12 12/21/2013 1229   ALT 11 12/21/2013 1229   BILITOT 0.43 12/21/2013 1229       No results found for this basename: LABCA2    No components found with this basename: LABCA125    No results found for this basename: INR,  in the last 168 hours  Urinalysis    Component Value Date/Time   COLORURINE YELLOW 03/21/2009 1122   APPEARANCEUR CLOUDY* 03/21/2009 1122   LABSPEC 1.011 03/21/2009 1122   PHURINE 7.5 03/21/2009 [place] 03/21/2009  [place] 03/21/2009 [place] 03/21/2009 [place] 03/21/2009 1122   PROTEINUR NEGATIVE 03/21/2009 1122   UROBILINOGEN 0.2 03/21/2009 1122   NITRITE NEGATIVE 03/21/2009 1122   LEUKOCYTESUR NEGATIVE MICROSCOPIC NOT DONE ON URINES WITH NEGATIVE  PROTEIN, BLOOD, LEUKOCYTES, NITRITE, OR GLUCOSE <1000 mg/dL. 03/21/2009 1122    STUDIES: Dg Chest 2 View  02/27/2014   CLINICAL DATA:  Preoperative for Port-A-Cath appliance placement  EXAM: CHEST  2 VIEW  COMPARISON:  DG CHEST 2V dated 12/31/2010  FINDINGS: The lungs are adequately inflated and clear. The cardiac silhouette is normal in size. The pulmonary vascularity is not engorged. There is mild tortuosity of the descending thoracic aorta. There is no pleural effusion or pneumothorax. The bony structures appear normal. Surgical clips project over the lateral aspect of the left breast and in the right upper quadrant of the abdomen.  IMPRESSION: There is no evidence of active cardiopulmonary disease.   Electronically Signed   By: David  Martinique   On: 02/27/2014 15:10   Chest Port 1 View  02/28/2014   CLINICAL DATA:  Port-A-Cath placement.  Left breast cancer.  EXAM: PORTABLE CHEST - 1 VIEW  COMPARISON:  DG CHEST 2 VIEW dated 02/27/2014  FINDINGS: A right subclavian Port-A-Cath has been placed. The catheter tip is in the mid SVC region. There is no evidence for a pneumothorax. Both lungs are clear. Heart and mediastinum are within normal limits. Few densities at the right lung apex could represent mild scarring.  IMPRESSION: Placement of right subclavian Port-A-Cath. Catheter tip in the SVC region. Negative for a pneumothorax.   Electronically Signed   By: Markus Daft M.D.   On: 02/28/2014 16:18   Dg Fluoro Guide Cv Line-no Report  02/28/2014   CLINICAL DATA: Portacath   FLOURO GUIDE CV LINE  Fluoroscopy was utilized by the requesting physician.  No radiographic  interpretation.     ASSESSMENT: 63 y.o. [place] woman status post left breast biopsy 12/12/2013 for a clinical T1a N0 invasive ductal carcinoma, low to intermediate grade, triple negative, with an MIB-1 of 72%  (1) status post left lumpectomy and sentinel lymph node sampling 01/16/2014 for a pT1c pN0, stage IA invasive ductal carcinoma, grade  3, repeat prognostic panel again triple negative.  PLAN: We spent over 45 minutes today going over [place]'s concerns. She has been having "chills and sweats" to his thinking about nausea. We went over of her antinausea medication and she understands she needs to start the dexamethasone the day before treatment. The day of treatment of course we give her the antinausea medicines by vein and then on days 23 and 4 she will be taking dexamethasone and ondansetron as her main antinausea pills. If she has any nausea she can also take prochlorperazine up to 4 times a day.  She understands that lorazepam is chiefly to help her sleep because the Decadron may cause insomnia. She really uses Klonopin for that and she is welcome to continue to do that. We also discussed diet issues. She knows to avoid highly aromatic foods for the first few days and we went over a menu of things that she can easily half. Of course the most important thing is to keep herself well hydrated.  She has not yet gone through chemotherapy school and I have scheduled him for her this week. I am also trying to get her a nutrition appointment. We are canceling all the old appointment since her treatment has  been delayed and she will start her treatments April 6. She will see [place] a week later to make sure she tolerated treatment well and make any adjustments necessary. We plan to see her on days 1 and 8 of each one of her for chemotherapy treatment.  Sheri Arellano has a good understanding of the overall plan. She agrees with it. She knows the goal of treatment in her case is cure. She will call with any problems that may develop before her next visit here.  Chauncey Cruel, MD   03/06/2014 10:12 AM

## 2014-03-06 NOTE — Telephone Encounter (Signed)
Per staff message and POF I have scheduled appts.  JMW  

## 2014-03-07 ENCOUNTER — Other Ambulatory Visit: Payer: BC Managed Care – PPO

## 2014-03-07 ENCOUNTER — Ambulatory Visit: Payer: BC Managed Care – PPO

## 2014-03-07 ENCOUNTER — Encounter: Payer: Self-pay | Admitting: *Deleted

## 2014-03-09 ENCOUNTER — Encounter: Payer: Self-pay | Admitting: *Deleted

## 2014-03-09 ENCOUNTER — Other Ambulatory Visit: Payer: Self-pay | Admitting: Physician Assistant

## 2014-03-10 ENCOUNTER — Telehealth: Payer: Self-pay | Admitting: *Deleted

## 2014-03-10 NOTE — Telephone Encounter (Signed)
Mailed after appt letter to pt. 

## 2014-03-13 ENCOUNTER — Ambulatory Visit: Payer: BC Managed Care – PPO | Admitting: Nutrition

## 2014-03-13 ENCOUNTER — Encounter: Payer: Self-pay | Admitting: Oncology

## 2014-03-13 ENCOUNTER — Other Ambulatory Visit: Payer: Self-pay | Admitting: Oncology

## 2014-03-13 ENCOUNTER — Ambulatory Visit (HOSPITAL_BASED_OUTPATIENT_CLINIC_OR_DEPARTMENT_OTHER): Payer: BC Managed Care – PPO

## 2014-03-13 ENCOUNTER — Ambulatory Visit: Payer: BC Managed Care – PPO

## 2014-03-13 ENCOUNTER — Other Ambulatory Visit: Payer: Self-pay | Admitting: Emergency Medicine

## 2014-03-13 VITALS — BP 127/77 | HR 65 | Temp 97.7°F | Resp 18

## 2014-03-13 VITALS — BP 130/73 | HR 83

## 2014-03-13 DIAGNOSIS — C50919 Malignant neoplasm of unspecified site of unspecified female breast: Secondary | ICD-10-CM

## 2014-03-13 DIAGNOSIS — C50412 Malignant neoplasm of upper-outer quadrant of left female breast: Secondary | ICD-10-CM

## 2014-03-13 DIAGNOSIS — C50419 Malignant neoplasm of upper-outer quadrant of unspecified female breast: Secondary | ICD-10-CM

## 2014-03-13 DIAGNOSIS — Z95828 Presence of other vascular implants and grafts: Secondary | ICD-10-CM

## 2014-03-13 DIAGNOSIS — Z5111 Encounter for antineoplastic chemotherapy: Secondary | ICD-10-CM

## 2014-03-13 LAB — COMPREHENSIVE METABOLIC PANEL (CC13)
ALT: 13 U/L (ref 0–55)
AST: 12 U/L (ref 5–34)
Albumin: 4.1 g/dL (ref 3.5–5.0)
Alkaline Phosphatase: 53 U/L (ref 40–150)
Anion Gap: 11 mEq/L (ref 3–11)
BUN: 11.6 mg/dL (ref 7.0–26.0)
CO2: 23 mEq/L (ref 22–29)
Calcium: 9.9 mg/dL (ref 8.4–10.4)
Chloride: 101 mEq/L (ref 98–109)
Creatinine: 0.7 mg/dL (ref 0.6–1.1)
Glucose: 132 mg/dl (ref 70–140)
Potassium: 3.7 mEq/L (ref 3.5–5.1)
Sodium: 136 mEq/L (ref 136–145)
Total Bilirubin: 0.35 mg/dL (ref 0.20–1.20)
Total Protein: 7.1 g/dL (ref 6.4–8.3)

## 2014-03-13 LAB — CBC WITH DIFFERENTIAL/PLATELET
BASO%: 0.2 % (ref 0.0–2.0)
Basophils Absolute: 0 10*3/uL (ref 0.0–0.1)
EOS%: 0 % (ref 0.0–7.0)
Eosinophils Absolute: 0 10*3/uL (ref 0.0–0.5)
HCT: 39.5 % (ref 34.8–46.6)
HGB: 13.8 g/dL (ref 11.6–15.9)
LYMPH%: 6.9 % — ABNORMAL LOW (ref 14.0–49.7)
MCH: 32.3 pg (ref 25.1–34.0)
MCHC: 35 g/dL (ref 31.5–36.0)
MCV: 92.2 fL (ref 79.5–101.0)
MONO#: 1.1 10*3/uL — ABNORMAL HIGH (ref 0.1–0.9)
MONO%: 4.9 % (ref 0.0–14.0)
NEUT#: 18.8 10*3/uL — ABNORMAL HIGH (ref 1.5–6.5)
NEUT%: 88 % — ABNORMAL HIGH (ref 38.4–76.8)
Platelets: 327 10*3/uL (ref 145–400)
RBC: 4.28 10*6/uL (ref 3.70–5.45)
RDW: 13.1 % (ref 11.2–14.5)
WBC: 21.4 10*3/uL — ABNORMAL HIGH (ref 3.9–10.3)
lymph#: 1.5 10*3/uL (ref 0.9–3.3)

## 2014-03-13 MED ORDER — ONDANSETRON 16 MG/50ML IVPB (CHCC)
INTRAVENOUS | Status: AC
  Filled 2014-03-13: qty 16

## 2014-03-13 MED ORDER — DEXAMETHASONE SODIUM PHOSPHATE 20 MG/5ML IJ SOLN
INTRAMUSCULAR | Status: AC
  Filled 2014-03-13: qty 5

## 2014-03-13 MED ORDER — HEPARIN SOD (PORK) LOCK FLUSH 100 UNIT/ML IV SOLN
500.0000 [IU] | Freq: Once | INTRAVENOUS | Status: AC | PRN
  Administered 2014-03-13: 500 [IU]
  Filled 2014-03-13: qty 5

## 2014-03-13 MED ORDER — DOCETAXEL CHEMO INJECTION 160 MG/16ML
75.0000 mg/m2 | Freq: Once | INTRAVENOUS | Status: AC
  Administered 2014-03-13: 130 mg via INTRAVENOUS
  Filled 2014-03-13: qty 13

## 2014-03-13 MED ORDER — SODIUM CHLORIDE 0.9 % IJ SOLN
10.0000 mL | INTRAMUSCULAR | Status: DC | PRN
  Administered 2014-03-13: 10 mL
  Filled 2014-03-13: qty 10

## 2014-03-13 MED ORDER — SODIUM CHLORIDE 0.9 % IJ SOLN
10.0000 mL | INTRAMUSCULAR | Status: DC | PRN
  Administered 2014-03-13: 10 mL via INTRAVENOUS
  Filled 2014-03-13: qty 10

## 2014-03-13 MED ORDER — DEXAMETHASONE SODIUM PHOSPHATE 20 MG/5ML IJ SOLN
20.0000 mg | Freq: Once | INTRAMUSCULAR | Status: AC
  Administered 2014-03-13: 20 mg via INTRAVENOUS

## 2014-03-13 MED ORDER — ONDANSETRON 16 MG/50ML IVPB (CHCC)
16.0000 mg | Freq: Once | INTRAVENOUS | Status: AC
  Administered 2014-03-13: 16 mg via INTRAVENOUS

## 2014-03-13 MED ORDER — HEPARIN SOD (PORK) LOCK FLUSH 100 UNIT/ML IV SOLN
500.0000 [IU] | Freq: Once | INTRAVENOUS | Status: DC
  Filled 2014-03-13: qty 5

## 2014-03-13 MED ORDER — SODIUM CHLORIDE 0.9 % IV SOLN
600.0000 mg/m2 | Freq: Once | INTRAVENOUS | Status: AC
  Administered 2014-03-13: 1040 mg via INTRAVENOUS
  Filled 2014-03-13: qty 52

## 2014-03-13 MED ORDER — SODIUM CHLORIDE 0.9 % IV SOLN
Freq: Once | INTRAVENOUS | Status: AC
  Administered 2014-03-13: 15:00:00 via INTRAVENOUS

## 2014-03-13 NOTE — Progress Notes (Signed)
Patient is a 63 year old female diagnosed with breast cancer.  She is a patient of Dr. Jana Hakim.  Past medical history includes esophageal dysmotility, anxiety, GERD, hyperlipidemia, vitamin D deficiency, malaise/fatigue, hiatal hernia.  Medications include vitamin D, Klonopin, Decadron, Ativan, Zofran, Protonix, Compazine, Zoloft, and Zocor.  Labs include potassium 3.4 on March 23.  Height: 62.5 inches. Weight: 153.4 pounds. Usual body weight: 150 pounds. BMI: 27.59.  Patient verbalizing anxiety about nausea and vomiting.  She states she has had issues with nausea since she was a little girl.  She also expresses concern about weight gain during and after treatment.  Patient is anxious overall.  Nutrition diagnosis: Food and nutrition related knowledge deficit related to new diagnosis of breast cancer and associated treatments as evidenced by no prior need for nutrition related information.  Intervention: Patient educated on strategies for small, frequent meals and snacks throughout the day.  Reviewed strategies for eating with nausea and vomiting.  Encouraged patient to drink fluids between meals and snacks.  Recommended patient try ginger tea or ginger ale.  Also reviewed strategies for improving constipation.  Questions were answered.  Teach back method used.  Provided patient with contact information.  Monitoring, evaluation, goals: Patient will tolerate oral intake to minimize nutrition side effects to promote weight stabilization.  Next visit: Monday, April 27, during chemotherapy.

## 2014-03-13 NOTE — Patient Instructions (Signed)
[facility] Discharge Instructions for Patients Receiving Chemotherapy  Today you received the following chemotherapy agents Taxotere and Cytoxan.  To help prevent nausea and vomiting after your treatment, we encourage you to take your nausea medication.   If you develop nausea and vomiting that is not controlled by your nausea medication, call the clinic.   BELOW ARE SYMPTOMS THAT SHOULD BE REPORTED IMMEDIATELY:  *FEVER GREATER THAN 100.5 F  *CHILLS WITH OR WITHOUT FEVER  NAUSEA AND VOMITING THAT IS NOT CONTROLLED WITH YOUR NAUSEA MEDICATION  *UNUSUAL SHORTNESS OF BREATH  *UNUSUAL BRUISING OR BLEEDING  TENDERNESS IN MOUTH AND THROAT WITH OR WITHOUT PRESENCE OF ULCERS  *URINARY PROBLEMS  *BOWEL PROBLEMS  UNUSUAL RASH Items with * indicate a potential emergency and should be followed up as soon as possible.  Feel free to call the clinic you have any questions or concerns. The clinic phone number is [phone].    

## 2014-03-13 NOTE — Patient Instructions (Signed)

## 2014-03-13 NOTE — Progress Notes (Signed)
Enrolled pt in the Neulasta First Step program.  Faxed signed form and activated card today.  °

## 2014-03-14 ENCOUNTER — Ambulatory Visit (HOSPITAL_BASED_OUTPATIENT_CLINIC_OR_DEPARTMENT_OTHER): Payer: BC Managed Care – PPO

## 2014-03-14 ENCOUNTER — Telehealth: Payer: Self-pay | Admitting: *Deleted

## 2014-03-14 VITALS — BP 118/70 | HR 69 | Temp 98.5°F

## 2014-03-14 DIAGNOSIS — Z5189 Encounter for other specified aftercare: Secondary | ICD-10-CM

## 2014-03-14 DIAGNOSIS — C50412 Malignant neoplasm of upper-outer quadrant of left female breast: Secondary | ICD-10-CM

## 2014-03-14 DIAGNOSIS — C50419 Malignant neoplasm of upper-outer quadrant of unspecified female breast: Secondary | ICD-10-CM

## 2014-03-14 MED ORDER — PEGFILGRASTIM INJECTION 6 MG/0.6ML
6.0000 mg | Freq: Once | SUBCUTANEOUS | Status: AC
  Administered 2014-03-14: 6 mg via SUBCUTANEOUS
  Filled 2014-03-14: qty 0.6

## 2014-03-14 NOTE — Telephone Encounter (Signed)
Sheri Arellano here for Neulasta injection following 1st TC chemotherapy.  States that she is doing fine except for a little nausea which has caused belching.  Told her that she could use GasX or milk based items for the belching.  Whenever she has nausea, she belches and the nausea goes away.  No vomiting or diarrhea.  All questions answered.  Know to call if she has any problems or concerns.

## 2014-03-14 NOTE — Patient Instructions (Signed)

## 2014-03-17 ENCOUNTER — Telehealth: Payer: Self-pay | Admitting: *Deleted

## 2014-03-17 NOTE — Telephone Encounter (Signed)
Pt left message stating she has not " been to the bathroom since I had my chemo on Monday "  " I have used fiberall and correctol "  Return call number left as [phone].  This RN returned call and obtained identified VM - message left for pt to return call and request to speak with an RN directly (this RN or triage) per need for further questions to best advise her on concern.

## 2014-03-20 ENCOUNTER — Ambulatory Visit (HOSPITAL_COMMUNITY)
Admission: RE | Admit: 2014-03-20 | Discharge: 2014-03-20 | Disposition: A | Discharge status: Home / Self Care | Payer: BC Managed Care – PPO | Source: Ambulatory Visit | Attending: Physician Assistant | Admitting: Physician Assistant

## 2014-03-20 ENCOUNTER — Other Ambulatory Visit (HOSPITAL_BASED_OUTPATIENT_CLINIC_OR_DEPARTMENT_OTHER): Payer: BC Managed Care – PPO

## 2014-03-20 ENCOUNTER — Ambulatory Visit (HOSPITAL_BASED_OUTPATIENT_CLINIC_OR_DEPARTMENT_OTHER): Payer: BC Managed Care – PPO | Admitting: Physician Assistant

## 2014-03-20 ENCOUNTER — Encounter: Payer: Self-pay | Admitting: Physician Assistant

## 2014-03-20 VITALS — BP 115/83 | HR 86 | Temp 98.4°F | Resp 18 | Ht 62.5 in | Wt 149.4 lb

## 2014-03-20 DIAGNOSIS — K5900 Constipation, unspecified: Secondary | ICD-10-CM | POA: Diagnosis present

## 2014-03-20 DIAGNOSIS — C50919 Malignant neoplasm of unspecified site of unspecified female breast: Secondary | ICD-10-CM | POA: Diagnosis not present

## 2014-03-20 DIAGNOSIS — C50419 Malignant neoplasm of upper-outer quadrant of unspecified female breast: Secondary | ICD-10-CM

## 2014-03-20 DIAGNOSIS — C50412 Malignant neoplasm of upper-outer quadrant of left female breast: Secondary | ICD-10-CM

## 2014-03-20 DIAGNOSIS — Z171 Estrogen receptor negative status [ER-]: Secondary | ICD-10-CM

## 2014-03-20 LAB — CBC WITH DIFFERENTIAL/PLATELET
BASO%: 1 % (ref 0.0–2.0)
Basophils Absolute: 0.1 10*3/uL (ref 0.0–0.1)
EOS%: 0.3 % (ref 0.0–7.0)
Eosinophils Absolute: 0 10*3/uL (ref 0.0–0.5)
HCT: 41.8 % (ref 34.8–46.6)
HGB: 15.2 g/dL (ref 11.6–15.9)
LYMPH%: 40.2 % (ref 14.0–49.7)
MCH: 32.3 pg (ref 25.1–34.0)
MCHC: 36.4 g/dL — ABNORMAL HIGH (ref 31.5–36.0)
MCV: 88.7 fL (ref 79.5–101.0)
MONO#: 0.9 10*3/uL (ref 0.1–0.9)
MONO%: 15.1 % — ABNORMAL HIGH (ref 0.0–14.0)
NEUT#: 2.6 10*3/uL (ref 1.5–6.5)
NEUT%: 43.4 % (ref 38.4–76.8)
Platelets: 263 10*3/uL (ref 145–400)
RBC: 4.71 10*6/uL (ref 3.70–5.45)
RDW: 12 % (ref 11.2–14.5)
WBC: 6.1 10*3/uL (ref 3.9–10.3)
lymph#: 2.5 10*3/uL (ref 0.9–3.3)
nRBC: 0 % (ref 0–0)

## 2014-03-20 LAB — COMPREHENSIVE METABOLIC PANEL (CC13)
ALT: 17 U/L (ref 0–55)
AST: 14 U/L (ref 5–34)
Albumin: 3.8 g/dL (ref 3.5–5.0)
Alkaline Phosphatase: 72 U/L (ref 40–150)
Anion Gap: 10 mEq/L (ref 3–11)
BUN: 8.8 mg/dL (ref 7.0–26.0)
CO2: 28 mEq/L (ref 22–29)
Calcium: 9.7 mg/dL (ref 8.4–10.4)
Chloride: 94 mEq/L — ABNORMAL LOW (ref 98–109)
Creatinine: 0.8 mg/dL (ref 0.6–1.1)
Glucose: 107 mg/dl (ref 70–140)
Potassium: 3.7 mEq/L (ref 3.5–5.1)
Sodium: 132 mEq/L — ABNORMAL LOW (ref 136–145)
Total Bilirubin: 0.4 mg/dL (ref 0.20–1.20)
Total Protein: 6.9 g/dL (ref 6.4–8.3)

## 2014-03-20 NOTE — Progress Notes (Signed)
[place]  [phone] Fax:(336) [phone]     ID: Sheri Arellano OB: 25-Dec-1950  MR#: [id number]  HFW#:[id number]  PCP: Sheri Screws, MD GYN:  Sheri Arellano SU: Sheri Arellano OTHER MD: Sheri Arellano, Sheri Arellano  CHIEF COMPLAINT:  Left Breast Cancer/Adjuvant Chemotherapy   BREAST CANCER HISTORY:: Sheri Arellano had routine screening mammography at [facility] 11/21/2013 showing an irregular density in the left breast at the 1:00 position. Ultrasound was obtained 11/29/2013 and showed a 4 mm tolerate them live irregular mass in the left breast at the 2:00 position. This was hypoechoic. Biopsy of this area 12/12/2013 showed ([id number] [id number]) an invasive ductal carcinoma, grade 1 or 2, estrogen receptor 0%, progesterone receptor 0%, with no HER-2 amplification, the signals ratio being 1.09 and the number per cell be 1.95. The MIB-1-1 was 72%.  The patient subsequent history is as detailed below  INTERVAL HISTORY: Sheri Arellano returns today accompanied by her husband Sheri Arellano for followup of her left breast cancer. She is currently day 8 cycle 1 of 4 planned q. three-week doses of docetaxel/cyclophosphamide being given in the adjuvant setting. She receives Neulasta on day 2 for granulocyte support.  Sheri Arellano has had 2 primary complaints following chemotherapy. She has had some increased joint pain, and bony aches and pains, primarily in her hips and lower extremities. This seems to be a little worse at night, and improves during the day when she is up and moving around.   She has also had a severe problem with constipation and in fact has only had one very small bowel movement over the past week. She tells me that constipation is not entirely unusual for her. Her "normal" is a bowel movement every 2-3 days. She tells me she has "tried everything" , including suppositories, stool softeners, MiraLAX,  and prunes.  At this point, she is also having difficulty eating due to a feeling of fullness.  Fortunately, however, she has had no problems with nausea and no emesis. She does feel bloated which is understandable. She has been burping and belching which she tells me she would "never normally do". She denies any symptoms of heartburn or acid indigestion.  REVIEW OF SYSTEMS: Otherwise,  Sheri Arellano has had no fevers or chills. She does have some hot flashes and night sweats. She feels mildly fatigued. She's had no skin changes, rashes, or nail bed changes, and also denies any abnormal bruising or bleeding. She's had no change in urinary habits, no tissue or your or hematuria. She denies any cough, phlegm production, shortness of breath, peripheral swelling, chest pain, or palpitations. She's had no abnormal headaches or dizziness, and denies any change in vision. As noted above, she has had some bony aches and pains in her legs and hips, and also complains of some diffuse joint pain. She denies any signs of peripheral neuropathy in either the upper or lower extremities.  A detailed review of systems is otherwise stable and noncontributory.    PAST MEDICAL HISTORY: Past Medical History  Diagnosis Date  . Esophageal dysmotility   . Anxiety     probable panic attacks  . Insomnia   . GERD (gastroesophageal reflux disease)   . Hyperlipidemia   . Dysphagia   . Vitamin D deficiency   . Other malaise and fatigue   . SVT (supraventricular tachycardia)     documented-takes metoprolol as needed  . H/O hiatal hernia   . Cancer     breast  . Iron deficiency anemia     hx  PAST SURGICAL HISTORY: Past Surgical History  Procedure Laterality Date  . Carpal tunnel release      lt and rt  . Cholecystectomy  1999  . Partial mastectomy with needle localization and axillary sentinel lymph node bx Left 01/16/2014    Procedure: PARTIAL MASTECTOMY WITH NEEDLE LOCALIZATION AND AXILLARY SENTINEL LYMPH NODE BIOPSY;  Surgeon: Sheri Mention, MD;  Location: [facility];  Service: General;   Laterality: Left;  . Portacath placement Right 02/28/2014    Procedure: INSERTION PORT-A-CATH;  Surgeon: Sheri Mention, MD;  Location: [facility] OR;  Service: General;  Laterality: Right;    FAMILY HISTORY Family History  Problem Relation Age of Onset  . Atrial fibrillation Mother   . Hyperlipidemia Mother   . Hypertension Mother   . Cancer Father     bladder  . Cancer Maternal Aunt     breast   the patient's parents are living, both in their 31s. The patient had one brother and one no sisters. One of the patient's mothers 3 sisters was diagnosed with breast cancer before the age of 36. There is no history of ovarian cancer in the family.  GYNECOLOGIC HISTORY:  (Updated 03/20/2014) Menarche age 48, first live birth age 35, the patient is GX P2. She underwent menopause at age 17, and has been on hormone replacement for the last 23 years (estradiol and norethindrone most recently). 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 Sheri Arellano used to work for [employer]. Their son date lives in [place] where he owns an Nurse, learning disability and export company of [place] [employer]. Son Sheri Arellano lives in [place] [facility] [place] [facility] [place], currently working for [employer]. The patient has 3 grandchildren. She is a Investment banker, operational.    ADVANCED DIRECTIVES: In place   HEALTH MAINTENANCE: (Updated 03/20/2014) History  Substance Use Topics  . Smoking status: Former Smoker -- 1.50 packs/day for 8 years    Types: Cigarettes    Quit date: 01/22/1976  . Smokeless tobacco: Former Neurosurgeon    Quit date: 02/16/1975  . Alcohol Use: 4.8 oz/week    1 Glasses of wine, 7 Shots of liquor per week     Comment: rarely     Colonoscopy: Not on file  PAP: March 2014, Dr. Kevan Arellano  Bone density: Not on file  Lipid panel: Not on file   Allergies  Allergen Reactions  . Erythromycin Base Other (See Comments)    Stomach ache    Current Outpatient Prescriptions  Medication Sig Dispense  Refill  . aspirin EC 81 MG tablet Take 81 mg by mouth daily.      . Cholecalciferol (VITAMIN D) 2000 UNITS tablet Take 2,000 Units by mouth daily.      . clonazePAM (KLONOPIN) 1 MG tablet Take 1 mg by mouth at bedtime as needed (sleep).       Marland Kitchen dexamethasone (DECADRON) 4 MG tablet Take 2 tablets (8 mg total) by mouth 2 (two) times daily. Start the day before Taxotere. Then again the day after chemo for 3 days.  30 tablet  1  . Fiber-Vitamins-Minerals (FIBERALL PO) Take 2 tablets by mouth every evening.       Marland Kitchen HYDROcodone-acetaminophen (NORCO) 5-325 MG per tablet Take 1-2 tablets by mouth every 6 (six) hours as needed.  30 tablet  0  . lidocaine-prilocaine (EMLA) cream Apply 1 application topically as needed.  30 g  0  . lidocaine-prilocaine (EMLA) cream Apply 1 application topically as  needed. Apply to port area 1-2 hours before chemotherapy and cover with plastic wrap  30 g  0  . loratadine (CLARITIN) 10 MG tablet Take 10 mg by mouth daily.      Marland Kitchen LORazepam (ATIVAN) 0.5 MG tablet Take 1 tablet (0.5 mg total) by mouth every 6 (six) hours as needed (Nausea or vomiting).  30 tablet  0  . metoprolol tartrate (LOPRESSOR) 25 MG tablet Take 25 mg by mouth as needed (tachycardia).       . nitroGLYCERIN (NITROSTAT) 0.4 MG SL tablet Place 0.4 mg under the tongue every 5 (five) minutes as needed (Esophageal spasms).       . ondansetron (ZOFRAN) 8 MG tablet Take 1 tablet (8 mg total) by mouth 2 (two) times daily. Start the day after chemo for 3 days. Then take as needed for nausea or vomiting.  30 tablet  1  . pantoprazole (PROTONIX) 40 MG tablet Take 40 mg by mouth daily.      . prochlorperazine (COMPAZINE) 10 MG tablet Take 1 tablet (10 mg total) by mouth every 6 (six) hours as needed (Nausea or vomiting).  30 tablet  1  . sertraline (ZOLOFT) 100 MG tablet Take 50 mg by mouth daily.       . simvastatin (ZOCOR) 40 MG tablet Take 20 mg by mouth at bedtime.        No current facility-administered medications  for this visit.    OBJECTIVE: Middle-aged white woman who appears slightly uncomfortable but is in no acute distress Filed Vitals:   03/20/14 1456  BP: 115/83  Pulse: 86  Temp: 98.4 F (36.9 C)  Resp: 18     Body mass index is 26.87 kg/(m^2).    ECOG FS: 1 Filed Weights   03/20/14 1456  Weight: 149 lb 6.4 oz (67.767 kg)   Physical Exam: HEENT:  Sclerae anicteric.  Oropharynx clear, pink, and moist. No evidence of mucositis or candidiasis. Neck is supple, trachea midline. No thyromegaly.  NODES:  No cervical or supraclavicular lymphadenopathy palpated.  BREAST EXAM:  Deferred. Axillae are benign bilaterally with no palpable lymphadenopathy. LUNGS:  Clear to auscultation bilaterally.  No wheezes or rhonchi HEART:  Regular rate and rhythm. No murmur. ABDOMEN:  Mildly distended, and mildly tender to palpation diffusely. No organomegaly or masses palpated. Bowel sounds are hypoactive.   MSK:  No focal spinal tenderness to palpation. Good range of motion bilaterally in the upper extremities. EXTREMITIES:  No peripheral edema.  No lymphedema noted in the left upper extremity. SKIN:  Benign with no visible rashes or skin lesions. No nail dyscrasia. No pallor. No excessive ecchymoses. NEURO:  Nonfocal. Well oriented.  Appropriate affect.    LAB RESULTS:  Lab Results  Component Value Date   WBC 6.1 03/20/2014   NEUTROABS 2.6 03/20/2014   HGB 15.2 03/20/2014   HCT 41.8 03/20/2014   MCV 88.7 03/20/2014   PLT 263 03/20/2014      Chemistry      Component Value Date/Time   NA 132* 03/20/2014 1441   NA 140 02/27/2014 1420   K 3.7 03/20/2014 1441   K 3.4* 02/27/2014 1420   CL 100 02/27/2014 1420   CO2 28 03/20/2014 1441   CO2 27 02/27/2014 1420   BUN 8.8 03/20/2014 1441   BUN 12 02/27/2014 1420   CREATININE 0.8 03/20/2014 1441   CREATININE 0.78 02/27/2014 1420      Component Value Date/Time   CALCIUM 9.7 03/20/2014 1441   CALCIUM 9.3  02/27/2014 1420   ALKPHOS 72 03/20/2014 1441   AST 14  03/20/2014 1441   ALT 17 03/20/2014 1441   BILITOT 0.40 03/20/2014 1441       STUDIES:  DG Abd 1 View 03/20/2014  CLINICAL DATA: Constipation. History of breast cancer.  EXAM:  ABDOMEN - 1 VIEW  COMPARISON: None.  FINDINGS:  The bowel gas pattern is normal. No significant stool burden or  impaction. No radio-opaque calculi or other significant radiographic  abnormality are seen.  IMPRESSION:  No evidence of bowel obstruction or significant stool burden.  Electronically Signed  By: Aletta Edouard M.D.  On: 03/20/2014 16:09    Chest Port 1 View 02/28/2014   CLINICAL DATA:  Port-A-Cath placement.  Left breast cancer.  EXAM: PORTABLE CHEST - 1 VIEW  COMPARISON:  DG CHEST 2 VIEW dated 02/27/2014  FINDINGS: A right subclavian Port-A-Cath has been placed. The catheter tip is in the mid SVC region. There is no evidence for a pneumothorax. Both lungs are clear. Heart and mediastinum are within normal limits. Few densities at the right lung apex could represent mild scarring.  IMPRESSION: Placement of right subclavian Port-A-Cath. Catheter tip in the SVC region. Negative for a pneumothorax.   Electronically Signed   By: Markus Daft M.D.   On: 02/28/2014 16:18      ASSESSMENT: 63 y.o.  woman status post left breast biopsy 12/12/2013 for a clinical T1a N0 invasive ductal carcinoma, low to intermediate grade, triple negative, with an MIB-1 of 72%  (1) status post left lumpectomy and sentinel lymph node sampling 01/16/2014 for a pT1c pN0, stage IA invasive ductal carcinoma, grade 3, repeat prognostic panel again triple negative.  (2)  receiving adjuvant chemotherapy to consist of 4 cycles of docetaxel/cyclophosphamide given on a Q. three-week basis, first dose given on 03/13/2014. Neulasta given on day 2 for granulocyte support.  (3) adjuvant chemotherapy to be followed with radiation therapy under the care of Dr. Sondra Come.    PLAN: The majority of our 30 minute appointment today was  spent discussing Sheri Arellano's concerns and Arellano effects following this first cycle of chemotherapy, in reviewing how to treat these issues appropriately. She was sent for a KUB today as detailed above, and we also reviewed those results in detail.  Fortunately it showed no evidence of obstruction.   I have advised Sheri Arellano to start with half a bottle of magnesium citrate this evening mixed with Sprite or ginger ale.  If she does not have a bowel movement after 1 hour, she should slowly drink the remaining half of the magnesium citrate. If she has had no bowel movement by tomorrow morning she should drink a warm beverage, and call us if there is still no bowel movement after 1 hour.  She was given all of these instructions in writing today. I also suggested that she try some Gas-X for the bloating and gas.  I also encouraged her to keep herself very well hydrated.  Once we get her bowels moving, I have given her written instructions on how to utilize a combination of Colace and MiraLAX on a daily basis. Her goal would be an easy bowel movement at least every other day, and we will adjust the dosages of the Colace and MiraLAX as needed to reach this goal. She knows she will need to be especially diligent and aggressive for the first few days following each cycle of chemotherapy. We may need to take another look at her anti-emetics if this continues following cycle  2, perhaps using less ondansetron and possibly even switching from prochlorperazine to metoclopramide.  We will continue to follow her closely with each cycle.   Sheri Arellano will return in 2 weeks, April 27, for repeat labs and physical exam in anticipation of her second dose of adjuvant chemotherapy. All of the above was reviewed in detail with both Sheri Arellano and her husband today, and I both voice their understanding and agreement with the above plan. Again, all of this information was given to her in writing. She knows to call she has any additional changes or  problems prior to her next appointment.   Theotis Burrow, PA-C   03/20/2014 4:42 PM

## 2014-03-21 ENCOUNTER — Telehealth: Payer: Self-pay | Admitting: *Deleted

## 2014-03-21 NOTE — Telephone Encounter (Signed)
I have adjusted appt 

## 2014-03-21 NOTE — Telephone Encounter (Signed)
Call pt to follow up on condition concerning constipation. No answer, but I will continue to try to call pt until I reach her. Message to be forwarded to [employer], PA-C.

## 2014-03-21 NOTE — Telephone Encounter (Signed)
Called pt again to follow up about constipation. Pt said she had great results this am and feels so much better. Pt said thanks for everything and she is doing fine. Message to be forwarded to [employer], PA-C.

## 2014-03-23 ENCOUNTER — Telehealth: Payer: Self-pay | Admitting: Oncology

## 2014-03-23 NOTE — Telephone Encounter (Signed)
lmonvm advising the pt of her April 27th appts and for her to pick up the rest of the appt schedules at that time.

## 2014-03-27 ENCOUNTER — Ambulatory Visit: Payer: BC Managed Care – PPO

## 2014-03-28 ENCOUNTER — Ambulatory Visit: Payer: BC Managed Care – PPO

## 2014-04-03 ENCOUNTER — Other Ambulatory Visit: Payer: Self-pay | Admitting: Oncology

## 2014-04-03 ENCOUNTER — Encounter: Payer: Self-pay | Admitting: Physician Assistant

## 2014-04-03 ENCOUNTER — Other Ambulatory Visit: Payer: Self-pay | Admitting: Physician Assistant

## 2014-04-03 ENCOUNTER — Ambulatory Visit: Payer: BC Managed Care – PPO | Admitting: Nutrition

## 2014-04-03 ENCOUNTER — Other Ambulatory Visit (HOSPITAL_BASED_OUTPATIENT_CLINIC_OR_DEPARTMENT_OTHER): Payer: BC Managed Care – PPO

## 2014-04-03 ENCOUNTER — Telehealth: Payer: Self-pay | Admitting: Oncology

## 2014-04-03 ENCOUNTER — Ambulatory Visit (HOSPITAL_BASED_OUTPATIENT_CLINIC_OR_DEPARTMENT_OTHER): Payer: BC Managed Care – PPO | Admitting: Physician Assistant

## 2014-04-03 ENCOUNTER — Other Ambulatory Visit: Payer: BC Managed Care – PPO

## 2014-04-03 ENCOUNTER — Ambulatory Visit (HOSPITAL_BASED_OUTPATIENT_CLINIC_OR_DEPARTMENT_OTHER): Payer: BC Managed Care – PPO

## 2014-04-03 ENCOUNTER — Telehealth: Payer: Self-pay | Admitting: Physician Assistant

## 2014-04-03 VITALS — BP 137/82 | HR 90 | Temp 97.9°F | Resp 18 | Ht 62.5 in | Wt 153.2 lb

## 2014-04-03 DIAGNOSIS — K5900 Constipation, unspecified: Secondary | ICD-10-CM

## 2014-04-03 DIAGNOSIS — C50419 Malignant neoplasm of upper-outer quadrant of unspecified female breast: Secondary | ICD-10-CM

## 2014-04-03 DIAGNOSIS — C50919 Malignant neoplasm of unspecified site of unspecified female breast: Secondary | ICD-10-CM

## 2014-04-03 DIAGNOSIS — Z5111 Encounter for antineoplastic chemotherapy: Secondary | ICD-10-CM

## 2014-04-03 DIAGNOSIS — C50412 Malignant neoplasm of upper-outer quadrant of left female breast: Secondary | ICD-10-CM

## 2014-04-03 DIAGNOSIS — Z171 Estrogen receptor negative status [ER-]: Secondary | ICD-10-CM

## 2014-04-03 LAB — COMPREHENSIVE METABOLIC PANEL (CC13)
ALT: 14 U/L (ref 0–55)
AST: 13 U/L (ref 5–34)
Albumin: 3.9 g/dL (ref 3.5–5.0)
Alkaline Phosphatase: 62 U/L (ref 40–150)
Anion Gap: 10 mEq/L (ref 3–11)
BUN: 12.5 mg/dL (ref 7.0–26.0)
CO2: 23 mEq/L (ref 22–29)
Calcium: 10.2 mg/dL (ref 8.4–10.4)
Chloride: 103 mEq/L (ref 98–109)
Creatinine: 0.7 mg/dL (ref 0.6–1.1)
Glucose: 133 mg/dl (ref 70–140)
Potassium: 4.6 mEq/L (ref 3.5–5.1)
Sodium: 136 mEq/L (ref 136–145)
Total Bilirubin: 0.4 mg/dL (ref 0.20–1.20)
Total Protein: 7 g/dL (ref 6.4–8.3)

## 2014-04-03 LAB — CBC WITH DIFFERENTIAL/PLATELET
BASO%: 0.2 % (ref 0.0–2.0)
Basophils Absolute: 0 10*3/uL (ref 0.0–0.1)
EOS%: 0 % (ref 0.0–7.0)
Eosinophils Absolute: 0 10*3/uL (ref 0.0–0.5)
HCT: 38.1 % (ref 34.8–46.6)
HGB: 13.1 g/dL (ref 11.6–15.9)
LYMPH%: 5.9 % — ABNORMAL LOW (ref 14.0–49.7)
MCH: 32.1 pg (ref 25.1–34.0)
MCHC: 34.4 g/dL (ref 31.5–36.0)
MCV: 93.3 fL (ref 79.5–101.0)
MONO#: 0.4 10*3/uL (ref 0.1–0.9)
MONO%: 2.6 % (ref 0.0–14.0)
NEUT#: 14.8 10*3/uL — ABNORMAL HIGH (ref 1.5–6.5)
NEUT%: 91.3 % — ABNORMAL HIGH (ref 38.4–76.8)
Platelets: 442 10*3/uL — ABNORMAL HIGH (ref 145–400)
RBC: 4.08 10*6/uL (ref 3.70–5.45)
RDW: 13.1 % (ref 11.2–14.5)
WBC: 16.2 10*3/uL — ABNORMAL HIGH (ref 3.9–10.3)
lymph#: 1 10*3/uL (ref 0.9–3.3)

## 2014-04-03 MED ORDER — ONDANSETRON 16 MG/50ML IVPB (CHCC)
INTRAVENOUS | Status: AC
  Filled 2014-04-03: qty 16

## 2014-04-03 MED ORDER — CYCLOPHOSPHAMIDE CHEMO INJECTION 1 GM
600.0000 mg/m2 | Freq: Once | INTRAMUSCULAR | Status: AC
  Administered 2014-04-03: 1040 mg via INTRAVENOUS
  Filled 2014-04-03: qty 52

## 2014-04-03 MED ORDER — DEXAMETHASONE SODIUM PHOSPHATE 20 MG/5ML IJ SOLN
20.0000 mg | Freq: Once | INTRAMUSCULAR | Status: AC
  Administered 2014-04-03: 20 mg via INTRAVENOUS

## 2014-04-03 MED ORDER — DOCETAXEL CHEMO INJECTION 160 MG/16ML
75.0000 mg/m2 | Freq: Once | INTRAVENOUS | Status: AC
  Administered 2014-04-03: 130 mg via INTRAVENOUS
  Filled 2014-04-03: qty 13

## 2014-04-03 MED ORDER — SODIUM CHLORIDE 0.9 % IV SOLN
Freq: Once | INTRAVENOUS | Status: AC
  Administered 2014-04-03: 10:00:00 via INTRAVENOUS

## 2014-04-03 MED ORDER — DEXAMETHASONE SODIUM PHOSPHATE 20 MG/5ML IJ SOLN
INTRAMUSCULAR | Status: AC
  Filled 2014-04-03: qty 5

## 2014-04-03 MED ORDER — HEPARIN SOD (PORK) LOCK FLUSH 100 UNIT/ML IV SOLN
500.0000 [IU] | Freq: Once | INTRAVENOUS | Status: AC | PRN
  Administered 2014-04-03: 500 [IU]
  Filled 2014-04-03: qty 5

## 2014-04-03 MED ORDER — ONDANSETRON 16 MG/50ML IVPB (CHCC)
16.0000 mg | Freq: Once | INTRAVENOUS | Status: AC
  Administered 2014-04-03: 16 mg via INTRAVENOUS

## 2014-04-03 MED ORDER — SODIUM CHLORIDE 0.9 % IJ SOLN
10.0000 mL | INTRAMUSCULAR | Status: DC | PRN
  Administered 2014-04-03: 10 mL
  Filled 2014-04-03: qty 10

## 2014-04-03 NOTE — Progress Notes (Signed)
[place]  [phone] [phone]) [phone]     ID: Sheri Arellano OB: 1951/09/15  MR#: [id number]  QAS#:[id number]  PCP: Sheri Screws, MD GYN:  Sheri Arellano SU: Sheri Arellano OTHER MD: Sheri Arellano, Sheri Arellano  CHIEF COMPLAINT:  Left Breast Cancer/Adjuvant Chemotherapy   BREAST CANCER HISTORY:: Sheri Arellano had routine screening mammography at [facility] 11/21/2013 showing an irregular density in the left breast at the 1:00 position. Ultrasound was obtained 11/29/2013 and showed a 4 mm tolerate them live irregular mass in the left breast at the 2:00 position. This was hypoechoic. Biopsy of this area 12/12/2013 showed ([id number] [id number]) an invasive ductal carcinoma, grade 1 or 2, estrogen receptor 0%, progesterone receptor 0%, with no HER-2 amplification, the signals ratio being 1.09 and the number per cell be 1.95. The MIB-1-1 was 72%.  The patient subsequent history is as detailed below  INTERVAL HISTORY: Sheri Arellano returns alone today  for followup of her left breast cancer. She is due for her second of 4 planned q. three-week doses of docetaxel/cyclophosphamide today, given in the adjuvant setting. She receives Neulasta on day 2 for granulocyte support.  Sheri Arellano is feeling very well today. When she was seen here 2 weeks ago she was having severe constipation. She was finally able to get her bowels moving under our instruction, and is now maintaining regular bowel movements with the use of stool softeners daily. She knows she will likely need to increase this, and possibly add MiraLAX over the next few days, but she is managing this quite well. Her appetite has improved. She's had no problems with nausea or emesis.   REVIEW OF SYSTEMS: Sheri Arellano really has no additional complaints today. She has had no fevers, night sweats, or chills. She does have some hot flashes which are stable. Her energy level has improved, and she tells me she feels "great" today.  She's had no skin  changes, rashes, or nail bed changes, and also denies any abnormal bruising or bleeding. She's eating well no nausea, emesis, diarrhea, constipation, or recent change in urinary habits.  She denies any cough, phlegm production, shortness of breath, peripheral swelling, chest pain, or palpitations. She's had no abnormal headaches or dizziness, and denies any change in vision. She had some bony aches and pains in her legs and hips following the Neulasta injection with cycle one, but this has completely resolved. She currently denies any unusual myalgias, arthralgias, or bony pain. She also denies any signs of peripheral neuropathy, in either the upper or lower extremities.   A detailed review of systems is otherwise stable and noncontributory.    PAST MEDICAL HISTORY: Past Medical History  Diagnosis Date  . Esophageal dysmotility   . Anxiety     probable panic attacks  . Insomnia   . GERD (gastroesophageal reflux disease)   . Hyperlipidemia   . Dysphagia   . Vitamin D deficiency   . Other malaise and fatigue   . SVT (supraventricular tachycardia)     documented-takes metoprolol as needed  . H/O hiatal hernia   . Cancer     breast  . Iron deficiency anemia     hx    PAST SURGICAL HISTORY: Past Surgical History  Procedure Laterality Date  . Carpal tunnel release      lt and rt  . Cholecystectomy  1999  . Partial mastectomy with needle localization and axillary sentinel lymph node bx Left 01/16/2014    Procedure: PARTIAL MASTECTOMY WITH NEEDLE LOCALIZATION AND AXILLARY SENTINEL LYMPH NODE  BIOPSY;  Surgeon: Sheri Hector, MD;  Location: [place];  Service: General;  Laterality: Left;  . Portacath placement Right 02/28/2014    Procedure: INSERTION PORT-A-CATH;  Surgeon: Sheri Hector, MD;  Location: [place];  Service: General;  Laterality: Right;    FAMILY HISTORY Family History  Problem Relation Age of Onset  . Atrial fibrillation Mother   . Hyperlipidemia  Mother   . Hypertension Mother   . Cancer Father     bladder  . Cancer Maternal Aunt     breast   the patient's parents are living, both in their 64s. The patient had one brother and one no sisters. One of the patient's mothers 3 sisters was diagnosed with breast cancer before the age of 60. There is no history of ovarian cancer in the family.  GYNECOLOGIC HISTORY:  (Updated 03/20/2014) Menarche age 53, first live birth age 74, the patient is Sheri Arellano P2. She underwent menopause at age 55, and has been on hormone replacement for the last 23 years (estradiol and norethindrone most recently). 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 Sheri Arellano used to work for [employer]. Their son date lives in [place] where he owns an Electronics engineer and [place] [employer] products. Son Sheri Arellano lives in [place], currently working for [employer]. The patient has 3 grandchildren. She is a Furniture conservator/restorer.    ADVANCED DIRECTIVES: In place   HEALTH MAINTENANCE: (Updated 04/03/2014) History  Substance Use Topics  . Smoking status: Former Smoker -- 1.50 packs/day for 8 years    Types: Cigarettes    Quit date: 01/22/1976  . Smokeless tobacco: Former Systems developer    Quit date: 02/16/1975  . Alcohol Use: 4.8 oz/week    1 Glasses of wine, 7 Shots of liquor per week     Comment: rarely     Colonoscopy: Not on file  PAP: March 2014, Dr. Inda Merlin  Bone density: Not on file  Lipid panel: Not on file   Allergies  Allergen Reactions  . Erythromycin Base Other (See Comments)    Stomach ache    Current Outpatient Prescriptions  Medication Sig Dispense Refill  . aspirin EC 81 MG tablet Take 81 mg by mouth daily.      . Cholecalciferol (VITAMIN D) 2000 UNITS tablet Take 2,000 Units by mouth daily.      . clonazePAM (KLONOPIN) 1 MG tablet Take 1 mg by mouth at bedtime as needed (sleep).       Marland Kitchen dexamethasone (DECADRON) 4 MG tablet Take 2 tablets (8 mg  total) by mouth 2 (two) times daily. Start the day before Taxotere. Then again the day after chemo for 3 days.  30 tablet  1  . Fiber-Vitamins-Minerals (FIBERALL PO) Take 2 tablets by mouth every evening.       . lidocaine-prilocaine (EMLA) cream Apply 1 application topically as needed.  30 g  0  . loratadine (CLARITIN) 10 MG tablet Take 10 mg by mouth daily.      Marland Kitchen LORazepam (ATIVAN) 0.5 MG tablet Take 1 tablet (0.5 mg total) by mouth every 6 (six) hours as needed (Nausea or vomiting).  30 tablet  0  . metoprolol tartrate (LOPRESSOR) 25 MG tablet Take 25 mg by mouth as needed (tachycardia).       . pantoprazole (PROTONIX) 40 MG tablet Take 40 mg by mouth daily.      . sertraline (ZOLOFT) 100 MG tablet  Take 50 mg by mouth daily.       . simvastatin (ZOCOR) 40 MG tablet Take 20 mg by mouth at bedtime.       Marland Kitchen HYDROcodone-acetaminophen (NORCO) 5-325 MG per tablet Take 1-2 tablets by mouth every 6 (six) hours as needed.  30 tablet  0  . nitroGLYCERIN (NITROSTAT) 0.4 MG SL tablet Place 0.4 mg under the tongue every 5 (five) minutes as needed (Esophageal spasms).       . ondansetron (ZOFRAN) 8 MG tablet Take 1 tablet (8 mg total) by mouth 2 (two) times daily. Start the day after chemo for 3 days. Then take as needed for nausea or vomiting.  30 tablet  1  . prochlorperazine (COMPAZINE) 10 MG tablet Take 1 tablet (10 mg total) by mouth every 6 (six) hours as needed (Nausea or vomiting).  30 tablet  1   No current facility-administered medications for this visit.    OBJECTIVE: Middle-aged white woman who appears  comfortable and is in no acute distress Filed Vitals:   04/03/14 0903  BP: 137/82  Pulse: 90  Temp: 97.9 F (36.6 C)  Resp: 18     Body mass index is 27.56 kg/(m^2).    ECOG FS: 0 Filed Weights   04/03/14 0903  Weight: 153 lb 3.2 oz (69.491 kg)   Physical Exam: HEENT:  Sclerae anicteric.  Oropharynx clear, pink, and moist. No ulcerations, mucositis or evidence of candidiasis. Neck is  supple, trachea midline. No thyromegaly.  NODES:  No cervical or supraclavicular lymphadenopathy palpated.  BREAST EXAM:  Deferred. Axillae are benign bilaterally with no palpable lymphadenopathy. LUNGS:  Clear to auscultation bilaterally with good excursion.  No wheezes or rhonchi HEART:  Regular rate and rhythm. No murmur. ABDOMEN:  Soft, nontender to palpation. No organomegaly or masses palpated. Bowel sounds are normal.   MSK:  No focal spinal tenderness to palpation. Good range of motion bilaterally in the upper extremities. EXTREMITIES:  No peripheral edema.  No lymphedema noted in the left upper extremity. SKIN:  Benign with no visible rashes or skin lesions. No nail dyscrasia. No pallor. No excessive ecchymoses. NEURO:  Nonfocal. Well oriented.  Appropriate affect.    LAB RESULTS:  Lab Results  Component Value Date   WBC 16.2* 04/03/2014   NEUTROABS 14.8* 04/03/2014   HGB 13.1 04/03/2014   HCT 38.1 04/03/2014   MCV 93.3 04/03/2014   PLT 442* 04/03/2014      Chemistry      Component Value Date/Time   NA 132* 03/20/2014 1441   NA 140 02/27/2014 1420   K 3.7 03/20/2014 1441   K 3.4* 02/27/2014 1420   CL 100 02/27/2014 1420   CO2 28 03/20/2014 1441   CO2 27 02/27/2014 1420   BUN 8.8 03/20/2014 1441   BUN 12 02/27/2014 1420   CREATININE 0.8 03/20/2014 1441   CREATININE 0.78 02/27/2014 1420      Component Value Date/Time   CALCIUM 9.7 03/20/2014 1441   CALCIUM 9.3 02/27/2014 1420   ALKPHOS 72 03/20/2014 1441   AST 14 03/20/2014 1441   ALT 17 03/20/2014 1441   BILITOT 0.40 03/20/2014 1441       STUDIES:  DG Abd 1 View 03/20/2014  CLINICAL DATA: Constipation. History of breast cancer.  EXAM:  ABDOMEN - 1 VIEW  COMPARISON: None.  FINDINGS:  The bowel gas pattern is normal. No significant stool burden or  impaction. No radio-opaque calculi or other significant radiographic  abnormality are seen.  IMPRESSION:  No evidence of bowel obstruction or significant stool burden.   Electronically Signed  By: Aletta Edouard M.D.  On: 03/20/2014 16:09       ASSESSMENT: 63 y.o. [place] woman status post left breast biopsy 12/12/2013 for a clinical T1a N0 invasive ductal carcinoma, low to intermediate grade, triple negative, with an MIB-1 of 72%  (1) status post left lumpectomy and sentinel lymph node sampling 01/16/2014 for a pT1c pN0, stage IA invasive ductal carcinoma, grade 3, repeat prognostic panel again triple negative.  (2)  receiving adjuvant chemotherapy to consist of 4 cycles of docetaxel/cyclophosphamide given on a Q. three-week basis, first dose given on 03/13/2014. Neulasta given on day 2 for granulocyte support.  (3) adjuvant chemotherapy to be followed with radiation therapy under the care of Dr. Sondra Come.    PLAN: Proceed to treatment today as scheduled for her second adjuvant cycle of docetaxel/cyclophosphamide. She will receive Neulasta tomorrow, and will see Dr.  Earnest Conroy next week for assessment of chemotoxicity on May 5. I will see her again on May 15 in anticipation of her third cycle of chemotherapy on May 18.   We again reviewed her home medication regimen for the next few days. She will take her antinausea medications as previously instructed, and will continue on her stool softeners plus or minus MiraLAX as needed to maintain regular bowel movements. She will take either Tylenol or ibuprofen in addition to the Claritin for bony aches and pains following Neulasta injection.   All the above was reviewed in detail with Sheri Arellano today. She voices both her understanding and agreement with our plan, and will call with any changes or problems prior to her next scheduled appointment.   Theotis Burrow, PA-C   04/03/2014 9:35 AM

## 2014-04-03 NOTE — Progress Notes (Signed)
Patient reports she ate primarily crackers and water for the first few days after chemotherapy secondary to fear of nausea and vomiting.  By the end of that week, she was eating well.  She is eating well the past 2 weeks.  Weight up slightly at 153.2 pounds from 149.4 pounds in April 13.  Weight is stable overall.  Patient denies nutrition side effects at this time.  Nutrition diagnosis: Food and nutrition related knowledge deficit continues.  Intervention: Patient educated on strategies for expanding her diet directly after chemotherapy to include a variety of bland foods.  Patient encouraged to consume plenty of protein, and to consume foods every 2 hours daily.  Patient verbalized understanding.  Teach back method used.  Monitoring, evaluation, goals: Patient will tolerate a bland diet after chemotherapy with goal of weight stabilization.  Next visit: Monday, May 18, during chemotherapy.

## 2014-04-03 NOTE — Telephone Encounter (Signed)
printed pt a copy of sch/and adv i would call with the June appt/chemco time & date

## 2014-04-03 NOTE — Telephone Encounter (Signed)
per pof pt nees to be seen on 6/5 or 6/8-Dr Magrinat of off one day and on call the other-sent email to him to see what day he can see her. Amy has pal-once I hear back I will sch chemo with Sharyn Lull

## 2014-04-03 NOTE — Progress Notes (Signed)
Labs within tx parameters. Tx completed without difficulty

## 2014-04-03 NOTE — Patient Instructions (Signed)
[facility] Discharge Instructions for Patients Receiving Chemotherapy  Today you received the following chemotherapy agents: Taxotere, Cytoxan  To help prevent nausea and vomiting after your treatment, we encourage you to take your nausea medication: Compazine 10 mg every 6 hrs as needed.    If you develop nausea and vomiting that is not controlled by your nausea medication, call the clinic.   BELOW ARE SYMPTOMS THAT SHOULD BE REPORTED IMMEDIATELY:  *FEVER GREATER THAN 100.5 F  *CHILLS WITH OR WITHOUT FEVER  NAUSEA AND VOMITING THAT IS NOT CONTROLLED WITH YOUR NAUSEA MEDICATION  *UNUSUAL SHORTNESS OF BREATH  *UNUSUAL BRUISING OR BLEEDING  TENDERNESS IN MOUTH AND THROAT WITH OR WITHOUT PRESENCE OF ULCERS  *URINARY PROBLEMS  *BOWEL PROBLEMS  UNUSUAL RASH Items with * indicate a potential emergency and should be followed up as soon as possible.  Feel free to call the clinic you have any questions or concerns. The clinic phone number is [phone].    

## 2014-04-04 ENCOUNTER — Ambulatory Visit (HOSPITAL_BASED_OUTPATIENT_CLINIC_OR_DEPARTMENT_OTHER): Payer: BC Managed Care – PPO

## 2014-04-04 VITALS — BP 106/63 | HR 96 | Temp 98.3°F

## 2014-04-04 DIAGNOSIS — C50412 Malignant neoplasm of upper-outer quadrant of left female breast: Secondary | ICD-10-CM

## 2014-04-04 DIAGNOSIS — Z5189 Encounter for other specified aftercare: Secondary | ICD-10-CM

## 2014-04-04 DIAGNOSIS — C50419 Malignant neoplasm of upper-outer quadrant of unspecified female breast: Secondary | ICD-10-CM

## 2014-04-04 MED ORDER — PEGFILGRASTIM INJECTION 6 MG/0.6ML
6.0000 mg | Freq: Once | SUBCUTANEOUS | Status: AC
  Administered 2014-04-04: 6 mg via SUBCUTANEOUS
  Filled 2014-04-04: qty 0.6

## 2014-04-05 ENCOUNTER — Telehealth: Payer: Self-pay | Admitting: Adult Health

## 2014-04-05 ENCOUNTER — Other Ambulatory Visit: Payer: Self-pay | Admitting: Physician Assistant

## 2014-04-05 NOTE — Telephone Encounter (Signed)
, °

## 2014-04-06 ENCOUNTER — Telehealth: Payer: Self-pay | Admitting: *Deleted

## 2014-04-06 NOTE — Telephone Encounter (Signed)
Per staff message and POF I have scheduled appts.  JMW  

## 2014-04-11 ENCOUNTER — Ambulatory Visit (HOSPITAL_BASED_OUTPATIENT_CLINIC_OR_DEPARTMENT_OTHER): Payer: BC Managed Care – PPO | Admitting: Hematology and Oncology

## 2014-04-11 ENCOUNTER — Other Ambulatory Visit (HOSPITAL_BASED_OUTPATIENT_CLINIC_OR_DEPARTMENT_OTHER): Payer: BC Managed Care – PPO

## 2014-04-11 VITALS — BP 111/70 | HR 98 | Temp 98.3°F | Resp 20 | Ht 62.0 in | Wt 150.9 lb

## 2014-04-11 DIAGNOSIS — K5900 Constipation, unspecified: Secondary | ICD-10-CM

## 2014-04-11 DIAGNOSIS — C50412 Malignant neoplasm of upper-outer quadrant of left female breast: Secondary | ICD-10-CM

## 2014-04-11 DIAGNOSIS — C50419 Malignant neoplasm of upper-outer quadrant of unspecified female breast: Secondary | ICD-10-CM

## 2014-04-11 DIAGNOSIS — D72829 Elevated white blood cell count, unspecified: Secondary | ICD-10-CM

## 2014-04-11 DIAGNOSIS — Z171 Estrogen receptor negative status [ER-]: Secondary | ICD-10-CM

## 2014-04-11 LAB — CBC WITH DIFFERENTIAL/PLATELET
BASO%: 1.6 % (ref 0.0–2.0)
Basophils Absolute: 0.4 10*3/uL — ABNORMAL HIGH (ref 0.0–0.1)
EOS%: 0.6 % (ref 0.0–7.0)
Eosinophils Absolute: 0.1 10*3/uL (ref 0.0–0.5)
HCT: 37.6 % (ref 34.8–46.6)
HGB: 13.3 g/dL (ref 11.6–15.9)
LYMPH%: 17.1 % (ref 14.0–49.7)
MCH: 32.3 pg (ref 25.1–34.0)
MCHC: 35.4 g/dL (ref 31.5–36.0)
MCV: 91.3 fL (ref 79.5–101.0)
MONO#: 3.6 10*3/uL — ABNORMAL HIGH (ref 0.1–0.9)
MONO%: 16.5 % — ABNORMAL HIGH (ref 0.0–14.0)
NEUT#: 13.9 10*3/uL — ABNORMAL HIGH (ref 1.5–6.5)
NEUT%: 64.2 % (ref 38.4–76.8)
Platelets: 292 10*3/uL (ref 145–400)
RBC: 4.12 10*6/uL (ref 3.70–5.45)
RDW: 13.5 % (ref 11.2–14.5)
WBC: 21.6 10*3/uL — ABNORMAL HIGH (ref 3.9–10.3)
lymph#: 3.7 10*3/uL — ABNORMAL HIGH (ref 0.9–3.3)
nRBC: 0 % (ref 0–0)

## 2014-04-11 NOTE — Progress Notes (Signed)
[place]  [phone] [phone]) [phone]     ID: Sheri Arellano OB: 11/27/1951  MR#: [id number]  OYD#:[id number]  PCP: [name], MD GYN:  Olena Mater SU: Fanny Skates OTHER MD: Gery Pray, Crista Luria  CHIEF COMPLAINT:  Left Breast Cancer/Adjuvant Chemotherapy   BREAST CANCER HISTORY:: As per previously documented note: [name] had routine screening mammography at [facility] 11/21/2013 showing an irregular density in the left breast at the 1:00 position. Ultrasound was obtained 11/29/2013 and showed a 4 mm tolerate them live irregular mass in the left breast at the 2:00 position. This was hypoechoic. Biopsy of this area 12/12/2013 showed (SAA 15-71) an invasive ductal carcinoma, grade 1 or 2, estrogen receptor 0%, progesterone receptor 0%, with no HER-2 amplification, the signals ratio being 1.09 and the number per cell be 1.95. The MIB-1-1 was 72%.  The patient subsequent history is as detailed below  INTERVAL HISTORY: [name] returns alone today  for followup of her left breast cancer. She has completed 2 cycles of Taxotere and cyclophosphamide chemotherapy. She says that she had  body pains after Neulasta therapy and Claritin helped somewhat. She also takes zyrtec as needed.   [name] complains of insomnia especially after chemotherapy she attributes that to steroid use. She says that her constipation is much better now. She also complains of taste changes. She denies any tingling or numbness, blood in the stool blood in the urine,  chest pain, shortness of breath, palpitations, blurred vision or headaches.   REVIEW OF SYSTEMS: Pertinent symptoms were mentioned in interval history A detailed review of systems is otherwise stable and noncontributory.   PAST MEDICAL HISTORY: Past Medical History  Diagnosis Date  . Esophageal dysmotility   . Anxiety     probable panic attacks  . Insomnia   . GERD (gastroesophageal reflux disease)   .  Hyperlipidemia   . Dysphagia   . Vitamin D deficiency   . Other malaise and fatigue   . SVT (supraventricular tachycardia)     documented-takes metoprolol as needed  . H/O hiatal hernia   . Cancer     breast  . Iron deficiency anemia     hx    PAST SURGICAL HISTORY: Past Surgical History  Procedure Laterality Date  . Carpal tunnel release      lt and rt  . Cholecystectomy  1999  . Partial mastectomy with needle localization and axillary sentinel lymph node bx Left 01/16/2014    Procedure: PARTIAL MASTECTOMY WITH NEEDLE LOCALIZATION AND AXILLARY SENTINEL LYMPH NODE BIOPSY;  Surgeon: [name], MD;  Location: [place];  Service: General;  Laterality: Left;  . Portacath placement Right 02/28/2014    Procedure: INSERTION PORT-A-CATH;  Surgeon: [name], MD;  Location: [place];  Service: General;  Laterality: Right;    FAMILY HISTORY Family History  Problem Relation Age of Onset  . Atrial fibrillation Mother   . Hyperlipidemia Mother   . Hypertension Mother   . Cancer Father     bladder  . Cancer Maternal Aunt     breast   the patient's parents are living, both in their 42s. The patient had one brother and one no sisters. One of the patient's mothers 3 sisters was diagnosed with breast cancer before the age of 74. There is no history of ovarian cancer in the family.  GYNECOLOGIC HISTORY:  (Updated 03/20/2014) Menarche age 46, first live birth age 38, the patient is [place] P2. She underwent menopause at age  40, and has been on hormone replacement for the last 23 years (estradiol and norethindrone most recently). This was stopped 12/17/2012  SOCIAL HISTORY:   (Updated 04/03/2014) [name] works as a Forensic psychologist. [name] used to work for [employer]. Their son date lives in [place] where he owns an Electronics engineer and [employer] [place] products. Son Ruby Cola lives in [place], currently working for [employer]. The patient has 3 grandchildren.  She is a Furniture conservator/restorer.    ADVANCED DIRECTIVES: In place   HEALTH MAINTENANCE: (Updated 04/03/2014) History  Substance Use Topics  . Smoking status: Former Smoker -- 1.50 packs/day for 8 years    Types: Cigarettes    Quit date: 01/22/1976  . Smokeless tobacco: Former Systems developer    Quit date: 02/16/1975  . Alcohol Use: 4.8 oz/week    1 Glasses of wine, 7 Shots of liquor per week     Comment: rarely     Colonoscopy: Not on file  PAP: March 2014, Dr. Inda Merlin  Bone density: Not on file  Lipid panel: Not on file   Allergies  Allergen Reactions  . Erythromycin Base Other (See Comments)    Stomach ache    Current Outpatient Prescriptions  Medication Sig Dispense Refill  . aspirin EC 81 MG tablet Take 81 mg by mouth daily.      . Cholecalciferol (VITAMIN D) 2000 UNITS tablet Take 2,000 Units by mouth daily.      . clonazePAM (KLONOPIN) 1 MG tablet Take 1 mg by mouth at bedtime as needed (sleep).       Marland Kitchen dexamethasone (DECADRON) 4 MG tablet Take 2 tablets (8 mg total) by mouth 2 (two) times daily. Start the day before Taxotere. Then again the day after chemo for 3 days.  30 tablet  1  . docusate sodium (COLACE) 100 MG capsule Take 100 mg by mouth 2 (two) times daily.      Marland Kitchen lidocaine-prilocaine (EMLA) cream Apply 1 application topically as needed.  30 g  0  . loratadine (CLARITIN) 10 MG tablet Take 10 mg by mouth daily.      Marland Kitchen LORazepam (ATIVAN) 0.5 MG tablet Take 1 tablet (0.5 mg total) by mouth every 6 (six) hours as needed (Nausea or vomiting).  30 tablet  0  . metoprolol tartrate (LOPRESSOR) 25 MG tablet Take 25 mg by mouth as needed (tachycardia).       . nitroGLYCERIN (NITROSTAT) 0.4 MG SL tablet Place 0.4 mg under the tongue every 5 (five) minutes as needed (Esophageal spasms).       . ondansetron (ZOFRAN) 8 MG tablet Take 1 tablet (8 mg total) by mouth 2 (two) times daily. Start the day after chemo for 3 days. Then take as needed for nausea or vomiting.  30 tablet  1  . pantoprazole  (PROTONIX) 40 MG tablet Take 40 mg by mouth daily.      . prochlorperazine (COMPAZINE) 10 MG tablet Take 1 tablet (10 mg total) by mouth every 6 (six) hours as needed (Nausea or vomiting).  30 tablet  1  . sertraline (ZOLOFT) 100 MG tablet Take 50 mg by mouth daily.       . simvastatin (ZOCOR) 40 MG tablet Take 20 mg by mouth at bedtime.        No current facility-administered medications for this visit.    OBJECTIVE: Middle-aged white woman who appears  comfortable and is in no acute distress Filed Vitals:   04/11/14 1100  BP: 111/70  Pulse: 98  Temp: 98.3 F (36.8 C)  Resp: 20     Body mass index is 27.59 kg/(m^2).    ECOG FS: 0 Filed Weights   04/11/14 1100  Weight: 150 lb 14.4 oz (68.448 kg)   Physical Exam: HEENT:  Sclerae anicteric.  Oropharynx clear, pink, and moist. No ulcerations, mucositis or evidence of candidiasis. Neck is supple, trachea midline. No thyromegaly.  NODES:  No cervical or supraclavicular lymphadenopathy palpated.  BREAST EXAM:  Left breast status post lumpectomy scar noted and no masses felt in both the breast. Axillae are benign bilaterally with no palpable lymphadenopathy. LUNGS:  Clear to auscultation bilaterally with good excursion.  No wheezes or rhonchi HEART:  Regular rate and rhythm. No murmur. ABDOMEN:  Soft, nontender to palpation. No organomegaly or masses palpated. Bowel sounds are normal.   MSK:  No focal spinal tenderness to palpation. Good range of motion bilaterally in the upper extremities. EXTREMITIES:  No peripheral edema.  No lymphedema noted in the left upper extremity. SKIN:  Benign with no visible rashes or skin lesions. No nail dyscrasia. No pallor. No excessive ecchymoses. NEURO:  Nonfocal. Well oriented.  Appropriate affect.    LAB RESULTS:  Lab Results  Component Value Date   WBC 21.6* 04/11/2014   NEUTROABS 13.9* 04/11/2014   HGB 13.3 04/11/2014   HCT 37.6 04/11/2014   MCV 91.3 04/11/2014   PLT 292 04/11/2014      Chemistry       Component Value Date/Time   NA 136 04/03/2014 0852   NA 140 02/27/2014 1420   K 4.6 04/03/2014 0852   K 3.4* 02/27/2014 1420   CL 100 02/27/2014 1420   CO2 23 04/03/2014 0852   CO2 27 02/27/2014 1420   BUN 12.5 04/03/2014 0852   BUN 12 02/27/2014 1420   CREATININE 0.7 04/03/2014 0852   CREATININE 0.78 02/27/2014 1420      Component Value Date/Time   CALCIUM 10.2 04/03/2014 0852   CALCIUM 9.3 02/27/2014 1420   ALKPHOS 62 04/03/2014 0852   AST 13 04/03/2014 0852   ALT 14 04/03/2014 0852   BILITOT 0.40 04/03/2014 0852       STUDIES:  DG Abd 1 View 03/20/2014  CLINICAL DATA: Constipation. History of breast cancer.  EXAM:  ABDOMEN - 1 VIEW  COMPARISON: None.  FINDINGS:  The bowel gas pattern is normal. No significant stool burden or  impaction. No radio-opaque calculi or other significant radiographic  abnormality are seen.  IMPRESSION:  No evidence of bowel obstruction or significant stool burden.  Electronically Signed  By: Irish Lack M.D.  On: 03/20/2014 16:09       ASSESSMENT: 63 y.o. [place] woman status post left breast biopsy 12/12/2013 for a clinical T1a N0 invasive ductal carcinoma, low to intermediate grade, triple negative, with an MIB-1 of 72%  (1) status post left lumpectomy and sentinel lymph node sampling 01/16/2014 for a pT1c pN0, stage IA invasive ductal carcinoma, grade 3, repeat prognostic panel again triple negative.  (2)  receiving adjuvant chemotherapy to consist of 4 cycles of docetaxel/cyclophosphamide given on a Q. three-week basis, first dose given on 03/13/2014. Neulasta given on day 2 for granulocyte support.  (3) adjuvant chemotherapy to be followed with radiation therapy under the care of Dr. Roselind Messier.   (4)Leukocytosis secondary to Neulasta/steroids   PLAN: Sheelah tolerated cycle 2 chemotherapy with Taxotere and cyclophosphamide relatively well with side effects of taste changes insomnia and constipation and myalgias and arthralgias after  Neulasta treatment.  I have asked her to take Claritin for 3 days with Neulasta treatment and also as needed zyrtec. I have reviewed her labs today and the leukocytosis is most likely related to Neulasta treatment and also steroid use  Follow up on May 18  for initiation of cycle 3 chemotherapy Taxotere and cyclophosphamide  Once again reiterated use of stool softeners for constipation    She voices  understanding and in  agreement with the current plan of care and will call with any changes or problems prior to her next scheduled appointment.   Wilmon Arms, MD  Medical oncology  04/11/2014 9:35 PM

## 2014-04-17 ENCOUNTER — Ambulatory Visit: Payer: BC Managed Care – PPO

## 2014-04-18 ENCOUNTER — Ambulatory Visit: Payer: BC Managed Care – PPO

## 2014-04-21 ENCOUNTER — Other Ambulatory Visit (HOSPITAL_BASED_OUTPATIENT_CLINIC_OR_DEPARTMENT_OTHER): Payer: BC Managed Care – PPO

## 2014-04-21 ENCOUNTER — Encounter: Payer: Self-pay | Admitting: Physician Assistant

## 2014-04-21 ENCOUNTER — Ambulatory Visit (HOSPITAL_COMMUNITY)
Admission: RE | Admit: 2014-04-21 | Discharge: 2014-04-21 | Disposition: A | Discharge status: Home / Self Care | Payer: BC Managed Care – PPO | Source: Ambulatory Visit | Attending: Physician Assistant | Admitting: Physician Assistant

## 2014-04-21 ENCOUNTER — Ambulatory Visit (HOSPITAL_BASED_OUTPATIENT_CLINIC_OR_DEPARTMENT_OTHER): Payer: BC Managed Care – PPO | Admitting: Physician Assistant

## 2014-04-21 VITALS — BP 115/69 | HR 98 | Temp 97.9°F | Resp 18 | Ht 62.0 in | Wt 158.6 lb

## 2014-04-21 DIAGNOSIS — C50419 Malignant neoplasm of upper-outer quadrant of unspecified female breast: Secondary | ICD-10-CM

## 2014-04-21 DIAGNOSIS — R600 Localized edema: Secondary | ICD-10-CM

## 2014-04-21 DIAGNOSIS — R609 Edema, unspecified: Secondary | ICD-10-CM

## 2014-04-21 DIAGNOSIS — M7989 Other specified soft tissue disorders: Secondary | ICD-10-CM

## 2014-04-21 DIAGNOSIS — C50412 Malignant neoplasm of upper-outer quadrant of left female breast: Secondary | ICD-10-CM

## 2014-04-21 DIAGNOSIS — Z171 Estrogen receptor negative status [ER-]: Secondary | ICD-10-CM

## 2014-04-21 LAB — CBC WITH DIFFERENTIAL/PLATELET
BASO%: 1.3 % (ref 0.0–2.0)
Basophils Absolute: 0.1 10*3/uL (ref 0.0–0.1)
EOS%: 0.2 % (ref 0.0–7.0)
Eosinophils Absolute: 0 10*3/uL (ref 0.0–0.5)
HCT: 34.4 % — ABNORMAL LOW (ref 34.8–46.6)
HGB: 11.9 g/dL (ref 11.6–15.9)
LYMPH%: 27.3 % (ref 14.0–49.7)
MCH: 32.8 pg (ref 25.1–34.0)
MCHC: 34.5 g/dL (ref 31.5–36.0)
MCV: 95.1 fL (ref 79.5–101.0)
MONO#: 0.4 10*3/uL (ref 0.1–0.9)
MONO%: 6.2 % (ref 0.0–14.0)
NEUT#: 4.7 10*3/uL (ref 1.5–6.5)
NEUT%: 65 % (ref 38.4–76.8)
Platelets: 237 10*3/uL (ref 145–400)
RBC: 3.61 10*6/uL — ABNORMAL LOW (ref 3.70–5.45)
RDW: 14.6 % — ABNORMAL HIGH (ref 11.2–14.5)
WBC: 7.2 10*3/uL (ref 3.9–10.3)
lymph#: 2 10*3/uL (ref 0.9–3.3)

## 2014-04-21 NOTE — Progress Notes (Signed)
Sheri Arellano  Telephone:([phone]) [phone] Fax:(336) [phone]     ID: Sheri Arellano OB: 1951-04-27  MR#: [id number]  AGT#:[id number]  PCP: [name], MD GYN:  Olena Mater SU: Fanny Skates OTHER MD: Gery Pray, Crista Luria  CHIEF COMPLAINT:  Left Breast Cancer/Adjuvant Chemotherapy   BREAST CANCER HISTORY:: Sheri Arellano had routine screening mammography at [facility] 11/21/2013 showing an irregular density in the left breast at the 1:00 position. Ultrasound was obtained 11/29/2013 and showed a 4 mm tolerate them live irregular mass in the left breast at the 2:00 position. This was hypoechoic. Biopsy of this area 12/12/2013 showed (SAA 15-71) an invasive ductal carcinoma, grade 1 or 2, estrogen receptor 0%, progesterone receptor 0%, with no HER-2 amplification, the signals ratio being 1.09 and the number per cell be 1.95. The MIB-1-1 was 72%.  The patient subsequent history is as detailed below  INTERVAL HISTORY: Sheri Arellano returns alone today  for followup of her left breast cancer. She is scheduled for her next cycle of chemotherapy on Monday, 04/24/2014, which will be day 1 cycle 3 of 4 planned q. three-week doses of docetaxel/cyclophosphamide today, given in the adjuvant setting. She receives Neulasta on day 2 for granulocyte support.  Sheri Arellano has been tolerating chemotherapy reasonably well. She is finally regulated her bowel movements with the use of stool softeners and MiraLAX. She's had no problems with nausea or emesis. She does have some bone pain after the Neulasta injection but that is tolerable, and at this point has resolved.   Sheri Arellano's biggest concern today is increased swelling bilaterally in the lower extremities. It started on the left, but now involves both legs. She tells me she has gained approximately 8 pounds in the last 1-2 weeks. She does have some discomfort in the legs bilaterally, but has noticed no skin changes or redness. She is especially concerned  because her mother has a history of bilateral DVTs.   REVIEW OF SYSTEMS: Sheri Arellano  has had no fevers, night sweats, or chills.  her energy level has been good this week. She's had no rashes or skin changes and denies any abnormal bruising or bleeding. Her appetite is good. She had some mild taste aversion which has now resolved. She's had no mouth ulcers or oral sensitivity. She denies any cough, phlegm production, shortness of breath, orthopnea, chest pain, or palpitations. She's had no abnormal headaches or dizziness. Other than the discomfort in her legs, currently she denies any unusual myalgias, arthralgias, or bony pain. She also denies any signs of peripheral neuropathy in either the upper or lower extremities today.  A detailed review of systems is otherwise stable and noncontributory.    PAST MEDICAL HISTORY: Past Medical History  Diagnosis Date  . Esophageal dysmotility   . Anxiety     probable panic attacks  . Insomnia   . GERD (gastroesophageal reflux disease)   . Hyperlipidemia   . Dysphagia   . Vitamin D deficiency   . Other malaise and fatigue   . SVT (supraventricular tachycardia)     documented-takes metoprolol as needed  . H/O hiatal hernia   . Cancer     breast  . Iron deficiency anemia     hx    PAST SURGICAL HISTORY: Past Surgical History  Procedure Laterality Date  . Carpal tunnel release      lt and rt  . Cholecystectomy  1999  . Partial mastectomy with needle localization and axillary sentinel lymph node bx Left 01/16/2014    Procedure: PARTIAL MASTECTOMY  WITH NEEDLE LOCALIZATION AND AXILLARY SENTINEL LYMPH NODE BIOPSY;  Surgeon: [name], MD;  Location: [place];  Service: General;  Laterality: Left;  . Portacath placement Right 02/28/2014    Procedure: INSERTION PORT-A-CATH;  Surgeon: [name], MD;  Location: [place];  Service: General;  Laterality: Right;    FAMILY HISTORY Family History  Problem Relation Age of Onset   . Atrial fibrillation Mother   . Hyperlipidemia Mother   . Hypertension Mother   . Cancer Father     bladder  . Cancer Maternal Aunt     breast   the patient's parents are living, both in their 52s. The patient had one brother and one no sisters. One of the patient's mothers 3 sisters was diagnosed with breast cancer before the age of 29. There is no history of ovarian cancer in the family.  GYNECOLOGIC HISTORY:  (Reviewed 04/21/2014) Menarche age 29, first live birth age 68, the patient is [place] P2. She underwent menopause at age 34, and has been on hormone replacement for the last 23 years (estradiol and norethindrone most recently). 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 Sheri Arellano used to work for [employer]. Their Sheri date lives in [place] where he owns an Electronics engineer and [place] [employer] [place] products. Sheri Arellano lives in [place], currently working for [employer]. The patient has 3 grandchildren. She is a Furniture conservator/restorer.    ADVANCED DIRECTIVES: In place   HEALTH MAINTENANCE: (Reviewed 04/21/2014) History  Substance Use Topics  . Smoking status: Former Smoker -- 1.50 packs/day for 8 years    Types: Cigarettes    Quit date: 01/22/1976  . Smokeless tobacco: Former Systems developer    Quit date: 02/16/1975  . Alcohol Use: 4.8 oz/week    1 Glasses of wine, 7 Shots of liquor per week     Comment: rarely     Colonoscopy: Not on file  PAP: March 2014, Dr. Inda Merlin  Bone density: Not on file  Lipid panel: Not on file   Allergies  Allergen Reactions  . Erythromycin Base Other (See Comments)    Stomach ache    Current Outpatient Prescriptions  Medication Sig Dispense Refill  . aspirin EC 81 MG tablet Take 81 mg by mouth daily.      . Cholecalciferol (VITAMIN D) 2000 UNITS tablet Take 2,000 Units by mouth daily.      . clonazePAM (KLONOPIN) 1 MG tablet Take 1 mg by mouth at bedtime as needed (sleep).       .  docusate sodium (COLACE) 100 MG capsule Take 100 mg by mouth 2 (two) times daily.      Marland Kitchen loratadine (CLARITIN) 10 MG tablet Take 10 mg by mouth daily.      . metoprolol tartrate (LOPRESSOR) 25 MG tablet Take 25 mg by mouth as needed (tachycardia).       . pantoprazole (PROTONIX) 40 MG tablet Take 40 mg by mouth daily.      . sertraline (ZOLOFT) 100 MG tablet Take 50 mg by mouth daily.       . simvastatin (ZOCOR) 40 MG tablet Take 20 mg by mouth at bedtime.       Marland Kitchen dexamethasone (DECADRON) 4 MG tablet Take 2 tablets (8 mg total) by mouth 2 (two) times daily. Start the day before Taxotere. Then again the day after chemo for 3 days.  30 tablet  1  . lidocaine-prilocaine (  EMLA) cream Apply 1 application topically as needed.  30 g  0  . LORazepam (ATIVAN) 0.5 MG tablet Take 1 tablet (0.5 mg total) by mouth every 6 (six) hours as needed (Nausea or vomiting).  30 tablet  0  . nitroGLYCERIN (NITROSTAT) 0.4 MG SL tablet Place 0.4 mg under the tongue every 5 (five) minutes as needed (Esophageal spasms).       . ondansetron (ZOFRAN) 8 MG tablet Take 1 tablet (8 mg total) by mouth 2 (two) times daily. Start the day after chemo for 3 days. Then take as needed for nausea or vomiting.  30 tablet  1  . prochlorperazine (COMPAZINE) 10 MG tablet Take 1 tablet (10 mg total) by mouth every 6 (six) hours as needed (Nausea or vomiting).  30 tablet  1   No current facility-administered medications for this visit.    OBJECTIVE: Middle-aged white woman who appears slightly uncomfortable but is in no acute distress Filed Vitals:   04/21/14 1120  BP: 115/69  Pulse: 98  Temp: 97.9 F (36.6 C)  Resp: 18     Body mass index is 29 kg/(m^2).    ECOG FS: 1 Filed Weights   04/21/14 1120  Weight: 158 lb 9.6 oz (71.94 kg)   Physical Exam: HEENT:  Sclerae anicteric.  Oropharynx clear, pink, and moist. No Evidence of mucositis or oropharyngeal candidiasis. Neck is supple, trachea midline. No thyromegaly.  NODES:  No  cervical or supraclavicular lymphadenopathy palpated.  BREAST EXAM:  Deferred. Axillae are benign bilaterally with no palpable lymphadenopathy. LUNGS:  Clear to auscultation bilaterally with good excursion.  No Crackles, wheezes or rhonchi HEART:  Regular rate and rhythm. No murmur. ABDOMEN:  Soft, nontende. No organomegaly or masses palpated.  Positive bowel sounds.   MSK:  No focal spinal tenderness to palpation. Good range of motion bilaterally in the upper extremities. EXTREMITIES:   1+ pitting edema bilaterally in the lower extremities, slightly greater on the left than the right. No visible erythema. No palpable cords.   No lymphedema noted in the left upper extremity. SKIN:  Benign with no visible rashes or skin lesions. No nail dyscrasia. No pallor. No excessive ecchymoses. No petechiae.  NEURO:  Nonfocal. Well oriented.  Appropriate affect.    LAB RESULTS:  Lab Results  Component Value Date   WBC 7.2 04/21/2014   NEUTROABS 4.7 04/21/2014   HGB 11.9 04/21/2014   HCT 34.4* 04/21/2014   MCV 95.1 04/21/2014   PLT 237 04/21/2014      Chemistry      Component Value Date/Time   NA 136 04/03/2014 0852   NA 140 02/27/2014 1420   K 4.6 04/03/2014 0852   K 3.4* 02/27/2014 1420   CL 100 02/27/2014 1420   CO2 23 04/03/2014 0852   CO2 27 02/27/2014 1420   BUN 12.5 04/03/2014 0852   BUN 12 02/27/2014 1420   CREATININE 0.7 04/03/2014 0852   CREATININE 0.78 02/27/2014 1420      Component Value Date/Time   CALCIUM 10.2 04/03/2014 0852   CALCIUM 9.3 02/27/2014 1420   ALKPHOS 62 04/03/2014 0852   AST 13 04/03/2014 0852   ALT 14 04/03/2014 0852   BILITOT 0.40 04/03/2014 0852       STUDIES:  DG Abd 1 View 03/20/2014  CLINICAL DATA: Constipation. History of breast cancer.  EXAM:  ABDOMEN - 1 VIEW  COMPARISON: None.  FINDINGS:  The bowel gas pattern is normal. No significant stool burden or  impaction. No radio-opaque  calculi or other significant radiographic  abnormality are seen.   IMPRESSION:  No evidence of bowel obstruction or significant stool burden.  Electronically Signed  By: Aletta Edouard M.D.  On: 03/20/2014 16:09       ASSESSMENT: 63 y.o. [place] woman status post left breast biopsy 12/12/2013 for a clinical T1a N0 invasive ductal carcinoma, low to intermediate grade, triple negative, with an MIB-1 of 72%  (1) status post left lumpectomy and sentinel lymph node sampling 01/16/2014 for a pT1c pN0, stage IA invasive ductal carcinoma, grade 3, repeat prognostic panel again triple negative.  (2)  receiving adjuvant chemotherapy to consist of 4 cycles of docetaxel/cyclophosphamide given on a Q. three-week basis, first dose given on 03/13/2014. Neulasta given on day 2 for granulocyte support.  (3) adjuvant chemotherapy to be followed with radiation therapy under the care of Dr. Sondra Come.    PLAN: Sheri Arellano is tolerating chemotherapy well overall, and we will plan to proceed with her third cycle as scheduled on Monday, May 18. I think it is very likely that the swelling she is experiencing is secondary to the docetaxel and the dexamethasone. To be prudent, however, we are going to obtain Dopplers of both legs to rule out the possibility of DVT. As long as the Dopplers are negative, I have advised her to elevate her feet as much as possible when sitting, drink plenty of water, and watch her sodium intake. We will continue to monitor the swelling closely, and she will call us if it worsens.   Otherwise as noted above, she will have her third cycle of docetaxel/cyclophosphamide on May 18. She will have her Neulasta injection on day 2, and I will see her the following week on May 26th for assessment of chemotoxicity.   All the above was reviewed in detail with [facility] today. She voices both her understanding and agreement with our plan, and will call with any changes or problems prior to her next scheduled appointment.   Theotis Burrow, PA-C   04/21/2014 12:17 PM

## 2014-04-21 NOTE — Progress Notes (Signed)
Bilateral lower extremity venous duplex:  No evidence of DVT, superficial thrombosis, or Baker's cyst.   

## 2014-04-24 ENCOUNTER — Ambulatory Visit (HOSPITAL_BASED_OUTPATIENT_CLINIC_OR_DEPARTMENT_OTHER): Payer: BC Managed Care – PPO

## 2014-04-24 ENCOUNTER — Ambulatory Visit: Payer: BC Managed Care – PPO | Admitting: Nutrition

## 2014-04-24 ENCOUNTER — Other Ambulatory Visit (HOSPITAL_BASED_OUTPATIENT_CLINIC_OR_DEPARTMENT_OTHER): Payer: BC Managed Care – PPO

## 2014-04-24 VITALS — BP 126/77 | HR 69 | Temp 98.3°F | Resp 18

## 2014-04-24 DIAGNOSIS — C50419 Malignant neoplasm of upper-outer quadrant of unspecified female breast: Secondary | ICD-10-CM

## 2014-04-24 DIAGNOSIS — C50919 Malignant neoplasm of unspecified site of unspecified female breast: Secondary | ICD-10-CM

## 2014-04-24 DIAGNOSIS — C50412 Malignant neoplasm of upper-outer quadrant of left female breast: Secondary | ICD-10-CM

## 2014-04-24 DIAGNOSIS — Z5111 Encounter for antineoplastic chemotherapy: Secondary | ICD-10-CM

## 2014-04-24 LAB — CBC WITH DIFFERENTIAL/PLATELET
BASO%: 0.9 % (ref 0.0–2.0)
Basophils Absolute: 0.1 10*3/uL (ref 0.0–0.1)
EOS%: 0.1 % (ref 0.0–7.0)
Eosinophils Absolute: 0 10*3/uL (ref 0.0–0.5)
HCT: 33.7 % — ABNORMAL LOW (ref 34.8–46.6)
HGB: 11.6 g/dL (ref 11.6–15.9)
LYMPH%: 18 % (ref 14.0–49.7)
MCH: 32.8 pg (ref 25.1–34.0)
MCHC: 34.4 g/dL (ref 31.5–36.0)
MCV: 95.5 fL (ref 79.5–101.0)
MONO#: 0.8 10*3/uL (ref 0.1–0.9)
MONO%: 6.1 % (ref 0.0–14.0)
NEUT#: 9.5 10*3/uL — ABNORMAL HIGH (ref 1.5–6.5)
NEUT%: 74.9 % (ref 38.4–76.8)
Platelets: 348 10*3/uL (ref 145–400)
RBC: 3.53 10*6/uL — ABNORMAL LOW (ref 3.70–5.45)
RDW: 14.7 % — ABNORMAL HIGH (ref 11.2–14.5)
WBC: 12.6 10*3/uL — ABNORMAL HIGH (ref 3.9–10.3)
lymph#: 2.3 10*3/uL (ref 0.9–3.3)

## 2014-04-24 LAB — COMPREHENSIVE METABOLIC PANEL (CC13)
ALT: 15 U/L (ref 0–55)
AST: 11 U/L (ref 5–34)
Albumin: 3.4 g/dL — ABNORMAL LOW (ref 3.5–5.0)
Alkaline Phosphatase: 54 U/L (ref 40–150)
Anion Gap: 10 mEq/L (ref 3–11)
BUN: 9.3 mg/dL (ref 7.0–26.0)
CO2: 24 mEq/L (ref 22–29)
Calcium: 9.5 mg/dL (ref 8.4–10.4)
Chloride: 104 mEq/L (ref 98–109)
Creatinine: 0.7 mg/dL (ref 0.6–1.1)
Glucose: 117 mg/dl (ref 70–140)
Potassium: 3.3 mEq/L — ABNORMAL LOW (ref 3.5–5.1)
Sodium: 137 mEq/L (ref 136–145)
Total Bilirubin: 0.37 mg/dL (ref 0.20–1.20)
Total Protein: 6.2 g/dL — ABNORMAL LOW (ref 6.4–8.3)

## 2014-04-24 MED ORDER — SODIUM CHLORIDE 0.9 % IV SOLN
600.0000 mg/m2 | Freq: Once | INTRAVENOUS | Status: AC
  Administered 2014-04-24: 1040 mg via INTRAVENOUS
  Filled 2014-04-24: qty 52

## 2014-04-24 MED ORDER — HEPARIN SOD (PORK) LOCK FLUSH 100 UNIT/ML IV SOLN
500.0000 [IU] | Freq: Once | INTRAVENOUS | Status: AC | PRN
  Administered 2014-04-24: 500 [IU]
  Filled 2014-04-24: qty 5

## 2014-04-24 MED ORDER — DOCETAXEL CHEMO INJECTION 160 MG/16ML
75.0000 mg/m2 | Freq: Once | INTRAVENOUS | Status: AC
  Administered 2014-04-24: 130 mg via INTRAVENOUS
  Filled 2014-04-24: qty 13

## 2014-04-24 MED ORDER — DEXAMETHASONE SODIUM PHOSPHATE 20 MG/5ML IJ SOLN
20.0000 mg | Freq: Once | INTRAMUSCULAR | Status: AC
  Administered 2014-04-24: 20 mg via INTRAVENOUS

## 2014-04-24 MED ORDER — DEXAMETHASONE SODIUM PHOSPHATE 20 MG/5ML IJ SOLN
INTRAMUSCULAR | Status: AC
  Filled 2014-04-24: qty 5

## 2014-04-24 MED ORDER — ONDANSETRON 16 MG/50ML IVPB (CHCC)
16.0000 mg | Freq: Once | INTRAVENOUS | Status: AC
  Administered 2014-04-24: 16 mg via INTRAVENOUS

## 2014-04-24 MED ORDER — SODIUM CHLORIDE 0.9 % IJ SOLN
10.0000 mL | INTRAMUSCULAR | Status: DC | PRN
  Administered 2014-04-24: 10 mL
  Filled 2014-04-24: qty 10

## 2014-04-24 MED ORDER — ONDANSETRON 16 MG/50ML IVPB (CHCC)
INTRAVENOUS | Status: AC
  Filled 2014-04-24: qty 16

## 2014-04-24 MED ORDER — SODIUM CHLORIDE 0.9 % IV SOLN
Freq: Once | INTRAVENOUS | Status: AC
  Administered 2014-04-24: 12:00:00 via INTRAVENOUS

## 2014-04-24 NOTE — Patient Instructions (Signed)
[facility] Discharge Instructions for Patients Receiving Chemotherapy  Today you received the following chemotherapy agents Taxotere, Cytoxan  To help prevent nausea and vomiting after your treatment, we encourage you to take your nausea medication as directed/prescribed    If you develop nausea and vomiting that is not controlled by your nausea medication, call the clinic.   BELOW ARE SYMPTOMS THAT SHOULD BE REPORTED IMMEDIATELY:  *FEVER GREATER THAN 100.5 F  *CHILLS WITH OR WITHOUT FEVER  NAUSEA AND VOMITING THAT IS NOT CONTROLLED WITH YOUR NAUSEA MEDICATION  *UNUSUAL SHORTNESS OF BREATH  *UNUSUAL BRUISING OR BLEEDING  TENDERNESS IN MOUTH AND THROAT WITH OR WITHOUT PRESENCE OF ULCERS  *URINARY PROBLEMS  *BOWEL PROBLEMS  UNUSUAL RASH Items with * indicate a potential emergency and should be followed up as soon as possible.  Feel free to call the clinic you have any questions or concerns. The clinic phone number is [phone].    

## 2014-04-24 NOTE — Progress Notes (Signed)
Patient is receiving chemotherapy for breast cancer.  Nutrition followup completed with patient and husband.  Patient has bilateral edema and weight has increased to 158.6 pounds May 15 from 150.9 pounds May 5.  Patient denies other problems at this time.  She reports she eats very bland foods for the first 4 days after chemotherapy. (mostly honey nut cheerios and milk)  She was told to be careful of the sodium containing foods she is consuming.  Patient requesting low-sodium diet education.  Nutrition diagnosis: Food and nutrition related knowledge deficit continues.  Intervention: Patient education on low-sodium diet.  Recommended patient consume increased protein foods, especially, the first 4 days after chemotherapy.  Reviewed, high-protein foods with patient. Encouraged weight maintenance.  Recommended, more frequent meals and snacks.  Patient was provided with a fact sheet on low-sodium diet.  Questions were answered and teach back method used. Expect good compliance.  Monitoring, evaluation, goals: Patient will tolerate increased protein, low-sodium diet for improvement in edema, and weight maintenance.  Next visit: Monday, June 8, during chemotherapy by relief dietitian.

## 2014-04-25 ENCOUNTER — Telehealth: Payer: Self-pay | Admitting: Oncology

## 2014-04-25 ENCOUNTER — Ambulatory Visit (HOSPITAL_BASED_OUTPATIENT_CLINIC_OR_DEPARTMENT_OTHER): Payer: BC Managed Care – PPO

## 2014-04-25 VITALS — BP 103/58 | HR 74 | Temp 97.4°F

## 2014-04-25 DIAGNOSIS — C50412 Malignant neoplasm of upper-outer quadrant of left female breast: Secondary | ICD-10-CM

## 2014-04-25 DIAGNOSIS — C50419 Malignant neoplasm of upper-outer quadrant of unspecified female breast: Secondary | ICD-10-CM

## 2014-04-25 DIAGNOSIS — Z5189 Encounter for other specified aftercare: Secondary | ICD-10-CM

## 2014-04-25 MED ORDER — PEGFILGRASTIM INJECTION 6 MG/0.6ML
6.0000 mg | Freq: Once | SUBCUTANEOUS | Status: AC
  Administered 2014-04-25: 6 mg via SUBCUTANEOUS
  Filled 2014-04-25: qty 0.6

## 2014-04-25 NOTE — Telephone Encounter (Signed)
lvm for pt regarding to May and June appt....mailed pt appt sched/avs and letter °

## 2014-04-25 NOTE — Telephone Encounter (Signed)
lvm for pt regarding to May and June appt....mailed pt appt sched/avs and letter

## 2014-05-02 ENCOUNTER — Other Ambulatory Visit (HOSPITAL_BASED_OUTPATIENT_CLINIC_OR_DEPARTMENT_OTHER): Payer: BC Managed Care – PPO

## 2014-05-02 ENCOUNTER — Ambulatory Visit (HOSPITAL_BASED_OUTPATIENT_CLINIC_OR_DEPARTMENT_OTHER): Payer: BC Managed Care – PPO | Admitting: Physician Assistant

## 2014-05-02 ENCOUNTER — Encounter: Payer: Self-pay | Admitting: Physician Assistant

## 2014-05-02 VITALS — BP 108/73 | HR 106 | Temp 98.9°F | Resp 20 | Ht 62.0 in | Wt 152.7 lb

## 2014-05-02 DIAGNOSIS — Z171 Estrogen receptor negative status [ER-]: Secondary | ICD-10-CM

## 2014-05-02 DIAGNOSIS — C50419 Malignant neoplasm of upper-outer quadrant of unspecified female breast: Secondary | ICD-10-CM

## 2014-05-02 DIAGNOSIS — C50412 Malignant neoplasm of upper-outer quadrant of left female breast: Secondary | ICD-10-CM

## 2014-05-02 LAB — CBC WITH DIFFERENTIAL/PLATELET
BASO%: 1 % (ref 0.0–2.0)
Basophils Absolute: 0.3 10*3/uL — ABNORMAL HIGH (ref 0.0–0.1)
EOS%: 0.5 % (ref 0.0–7.0)
Eosinophils Absolute: 0.1 10*3/uL (ref 0.0–0.5)
HCT: 35 % (ref 34.8–46.6)
HGB: 11.6 g/dL (ref 11.6–15.9)
LYMPH%: 13.1 % — ABNORMAL LOW (ref 14.0–49.7)
MCH: 32.1 pg (ref 25.1–34.0)
MCHC: 33.3 g/dL (ref 31.5–36.0)
MCV: 96.6 fL (ref 79.5–101.0)
MONO#: 1.7 10*3/uL — ABNORMAL HIGH (ref 0.1–0.9)
MONO%: 6.9 % (ref 0.0–14.0)
NEUT#: 19.5 10*3/uL — ABNORMAL HIGH (ref 1.5–6.5)
NEUT%: 78.5 % — ABNORMAL HIGH (ref 38.4–76.8)
Platelets: 326 10*3/uL (ref 145–400)
RBC: 3.62 10*6/uL — ABNORMAL LOW (ref 3.70–5.45)
RDW: 15.4 % — ABNORMAL HIGH (ref 11.2–14.5)
WBC: 24.8 10*3/uL — ABNORMAL HIGH (ref 3.9–10.3)
lymph#: 3.2 10*3/uL (ref 0.9–3.3)

## 2014-05-02 NOTE — Progress Notes (Signed)
[place]  [phone] [phone]) [phone]     ID: Sheri Arellano OB: 11-25-1951  MR#: [id number]  PYP#:[id number]  PCP: Sheri Screws, MD GYN:  Sheri Arellano SU: Sheri Arellano OTHER MD: Sheri Arellano, Sheri Arellano  CHIEF COMPLAINT:  Left Breast Cancer/Adjuvant Chemotherapy (docetaxel/cyclophosphamide)   BREAST CANCER HISTORY:: Sheri Arellano had routine screening mammography at [facility] 11/21/2013 showing an irregular density in the left breast at the 1:00 position. Ultrasound was obtained 11/29/2013 and showed a 4 mm tolerate them live irregular mass in the left breast at the 2:00 position. This was hypoechoic. Biopsy of this area 12/12/2013 showed (SAA [id number]) an invasive ductal carcinoma, grade 1 or 2, estrogen receptor 0%, progesterone receptor 0%, with no HER-2 amplification, the signals ratio being 1.09 and the number per cell be 1.95. The MIB-1-1 was 72%.  The patient subsequent history is as detailed below  INTERVAL HISTORY: Sheri Arellano returns alone today  for followup of her left breast cancer. She is currently day 9 cycle 3 of 4 planned q. three-week doses of adjuvant docetaxel/cyclophosphamide which she is tolerating extremely well. She received Neulasta on day 2 for granulocyte support.  Sheri Arellano has very few complaints today. The swelling she was experiencing last week has completely resolved. She no longer has problems with constipation. If anything, her bowel movements are more frequent, but they are not loose, and she's had no diarrhea. She denies any blood or mucus in the stool. She's had no abdominal pain. She also denies any problems with nausea or emesis, but does have some taste aversion and a decreased appetite.   Sheri Arellano's only concern today is a new rash bilaterally in the underarms. This has appeared over the past week, is red and dry, but has not been painful or itchy. She has no additional rashes elsewhere.  REVIEW OF SYSTEMS: Sheri Arellano  has had no  fevers, night sweats, or chills.  Her energy level is great last week, but she feels a little more tired today. She denies any abnormal bruising or bleeding. She's had no signs at all of numbness or tingling in the upper or lower extremities. She's had no mouth ulcers or oral sensitivity, and denies any nausea or emesis. She denies any cough, phlegm production, shortness of breath, orthopnea, chest pain, or palpitations. She's had no abnormal headaches or dizziness. She currently denies any unusual myalgias, arthralgias, or bony pain.  A detailed review of systems is otherwise stable and noncontributory.    PAST MEDICAL HISTORY: Past Medical History  Diagnosis Date  . Esophageal dysmotility   . Anxiety     probable panic attacks  . Insomnia   . GERD (gastroesophageal reflux disease)   . Hyperlipidemia   . Dysphagia   . Vitamin D deficiency   . Other malaise and fatigue   . SVT (supraventricular tachycardia)     documented-takes metoprolol as needed  . H/O hiatal hernia   . Cancer     breast  . Iron deficiency anemia     hx    PAST SURGICAL HISTORY: Past Surgical History  Procedure Laterality Date  . Carpal tunnel release      lt and rt  . Cholecystectomy  1999  . Partial mastectomy with needle localization and axillary sentinel lymph node bx Left 01/16/2014    Procedure: PARTIAL MASTECTOMY WITH NEEDLE LOCALIZATION AND AXILLARY SENTINEL LYMPH NODE BIOPSY;  Surgeon: Sheri Hector, MD;  Location: [facility];  Service: General;  Laterality: Left;  . Portacath placement Right  02/28/2014    Procedure: INSERTION PORT-A-CATH;  Surgeon: Sheri Hector, MD;  Location: [facility];  Service: General;  Laterality: Right;    FAMILY HISTORY Family History  Problem Relation Age of Onset  . Atrial fibrillation Mother   . Hyperlipidemia Mother   . Hypertension Mother   . Cancer Father     bladder  . Cancer Maternal Aunt     breast   the patient's parents are living, both in  their 22s. The patient had one brother and one no sisters. One of the patient's mothers 3 sisters was diagnosed with breast cancer before the age of 23. There is no history of ovarian cancer in the family.  GYNECOLOGIC HISTORY:  (Reviewed 05/02/2014) Menarche age 65, first live birth age 73, the patient is Sheri Arellano. She underwent menopause at age 89, and has been on hormone replacement for the last 23 years (estradiol and norethindrone most recently). 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 Their son date lives in [place] where he owns an Electronics engineer and [place] [employer] [place] products. Son Sheri Arellano lives in [place], currently working for [employer]. The patient has 3 grandchildren. She is a Furniture conservator/restorer.    ADVANCED DIRECTIVES: In place   HEALTH MAINTENANCE: (Reviewed 05/02/2014) History  Substance Use Topics  . Smoking status: Former Smoker -- 1.50 packs/day for 8 years    Types: Cigarettes    Quit date: 01/22/1976  . Smokeless tobacco: Former Systems developer    Quit date: 02/16/1975  . Alcohol Use: 4.8 oz/week    1 Glasses of wine, 7 Shots of liquor per week     Comment: rarely     Colonoscopy: Not on file  PAP: March 2014, Dr. Inda Arellano  Bone density: Not on file  Lipid panel: Not on file   Allergies  Allergen Reactions  . Erythromycin Base Other (See Comments)    Stomach ache    Current Outpatient Prescriptions  Medication Sig Dispense Refill  . aspirin EC 81 MG tablet Take 81 mg by mouth daily.      . Cholecalciferol (VITAMIN D) 2000 UNITS tablet Take 2,000 Units by mouth daily.      . clonazePAM (KLONOPIN) 1 MG tablet Take 1 mg by mouth at bedtime as needed (sleep).       Marland Kitchen doxycycline (MONODOX) 100 MG capsule       . loratadine (CLARITIN) 10 MG tablet Take 10 mg by mouth daily.      . pantoprazole (PROTONIX) 40 MG tablet Take 40 mg by mouth daily.       . sertraline (ZOLOFT) 100 MG tablet Take 50 mg by mouth daily.       . simvastatin (ZOCOR) 40 MG tablet Take 20 mg by mouth at bedtime.       Marland Kitchen dexamethasone (DECADRON) 4 MG tablet Take 2 tablets (8 mg total) by mouth 2 (two) times daily. Start the day before Taxotere. Then again the day after chemo for 3 days.  30 tablet  1  . docusate sodium (COLACE) 100 MG capsule Take 100 mg by mouth 2 (two) times daily.      Marland Kitchen lidocaine-prilocaine (EMLA) cream Apply 1 application topically as needed.  30 g  0  . LORazepam (ATIVAN) 0.5 MG tablet Take 1 tablet (0.5 mg total) by mouth every 6 (six) hours as needed (Nausea or vomiting).  30 tablet  0  . metoprolol tartrate (LOPRESSOR) 25 MG tablet Take 25 mg by mouth as needed (tachycardia).       . nitroGLYCERIN (NITROSTAT) 0.4 MG SL tablet Place 0.4 mg under the tongue every 5 (five) minutes as needed (Esophageal spasms).       . ondansetron (ZOFRAN) 8 MG tablet Take 1 tablet (8 mg total) by mouth 2 (two) times daily. Start the day after chemo for 3 days. Then take as needed for nausea or vomiting.  30 tablet  1  . prochlorperazine (COMPAZINE) 10 MG tablet Take 1 tablet (10 mg total) by mouth every 6 (six) hours as needed (Nausea or vomiting).  30 tablet  1   No current facility-administered medications for this visit.    OBJECTIVE: Middle-aged white woman who appears comfortable and is in no acute distress Filed Vitals:   05/02/14 1513  BP: 108/73  Pulse: 106  Temp: 98.9 F (37.2 C)  Resp: 20     Body mass index is 27.92 kg/(m^2).    ECOG FS: 0 Filed Weights   05/02/14 1513  Weight: 152 lb 11.2 oz (69.264 kg)   Physical Exam: HEENT:  Sclerae anicteric.  Oropharynx clear, pink, and moist with no ulcerations or mucositis. No evidence of oropharyngeal candidiasis.  Neck is supple, trachea midline. No thyromegaly.  NODES:  No cervical or supraclavicular lymphadenopathy palpated.  BREAST EXAM:  Right breast is unremarkable. Left breast is status  post lumpectomy with no suspicious nodularities or skin changes, no evidence of local recurrence. Axillae are benign bilaterally with no palpable lymphadenopathy. LUNGS:  Clear to auscultation bilaterally with good excursion.  No, wheezes or rhonchi HEART:  Regular rate and rhythm. No murmur appreciated. ABDOMEN:  Soft, nontender. No organomegaly or masses palpated.  Positive bowel sounds.   MSK:  No focal spinal tenderness to palpation. Good range of motion bilaterally in the upper extremities. EXTREMITIES:   No peripheral edema. Pedal edema has completely resolved. No lymphedema noted in the left upper extremity. SKIN:  In each axilla, there are well-circumscribed skin lesions, approximately 2 cm in diameter, erythematous and dry. No vesicles or pustules are noted. No additional rashes are visible on exam today. No nail dyscrasia. No pallor. No excessive ecchymoses. No petechiae.  NEURO:  Nonfocal. Well oriented.  Appropriate affect.    LAB RESULTS:  Lab Results  Component Value Date   WBC 24.8* 05/02/2014   NEUTROABS 19.5* 05/02/2014   HGB 11.6 05/02/2014   HCT 35.0 05/02/2014   MCV 96.6 05/02/2014   PLT 326 05/02/2014      Chemistry      Component Value Date/Time   NA 137 04/24/2014 1112   NA 140 02/27/2014 1420   K 3.3* 04/24/2014 1112   K 3.4* 02/27/2014 1420   CL 100 02/27/2014 1420   CO2 24 04/24/2014 1112   CO2 27 02/27/2014 1420   BUN 9.3 04/24/2014 1112   BUN 12 02/27/2014 1420   CREATININE 0.7 04/24/2014 1112   CREATININE 0.78 02/27/2014 1420      Component Value Date/Time   CALCIUM 9.5 04/24/2014 1112   CALCIUM 9.3 02/27/2014 1420   ALKPHOS 54 04/24/2014 1112   AST 11 04/24/2014 1112   ALT 15 04/24/2014 1112   BILITOT 0.37 04/24/2014 1112       STUDIES:  Lower Extremity Venous Duplex Bilateral 04/21/2014 - No obvious evidence of deep vein or superficial thrombosis involving the right lower extremity and left lower extremity. - No evidence  of Baker's cyst on the right or  left.   DG Abd 1 View 03/20/2014  CLINICAL DATA: Constipation. History of breast cancer.  EXAM:  ABDOMEN - 1 VIEW  COMPARISON: None.  FINDINGS:  The bowel gas pattern is normal. No significant stool burden or  impaction. No radio-opaque calculi or other significant radiographic  abnormality are seen.  IMPRESSION:  No evidence of bowel obstruction or significant stool burden.  Electronically Signed  By: Aletta Edouard M.D.  On: 03/20/2014 16:09       ASSESSMENT: 63 y.o. [place] woman status post left breast biopsy 12/12/2013 for a clinical T1a N0 invasive ductal carcinoma, low to intermediate grade, triple negative, with an MIB-1 of 72%  (1) status post left lumpectomy and sentinel lymph node sampling 01/16/2014 for a pT1c pN0, stage IA invasive ductal carcinoma, grade 3, repeat prognostic panel again triple negative.  (2)  receiving adjuvant chemotherapy to consist of 4 cycles of docetaxel/cyclophosphamide given on a Q. three-week basis, first dose given on 03/13/2014. Neulasta given on day 2 for granulocyte support.  (3) adjuvant chemotherapy to be followed with radiation therapy under the care of Dr. Sondra Come.    PLAN: Sheri Arellano  continues to tolerate treatment well, and has one more remaining cycle of docetaxel/cyclophosphamide before she completes her adjuvant chemotherapy. She scheduled for labs, physical exam, and her fourth and final dose of chemotherapy on June 8. I will see her the following week for assessment of chemotoxicity on June 15.  She'll also be meeting with Dr. Sondra Come in early June to discuss her upcoming radiation therapy.   I am making no changes to Sheri Arellano's current regimen. I have asked her to use an antifungal cream topically in the axillae twice daily for the next week to see if this helps resolve the rash, which is consistent with a candidal or fungal skin rash. I have asked her to contact us, however, if the areas  enlarge, become painful or itchy, or if she  has any additional rashes develop elsewhere.  All the above was reviewed in detail with [facility] today. She voices both her understanding and agreement with our plan, and will call with any changes or problems prior to her next scheduled appointment.   Theotis Burrow, PA-C   05/02/2014 3:46 PM

## 2014-05-03 ENCOUNTER — Ambulatory Visit: Payer: BC Managed Care – PPO | Admitting: Physician Assistant

## 2014-05-03 ENCOUNTER — Other Ambulatory Visit: Payer: BC Managed Care – PPO

## 2014-05-08 ENCOUNTER — Ambulatory Visit: Payer: BC Managed Care – PPO

## 2014-05-08 ENCOUNTER — Other Ambulatory Visit: Payer: Self-pay | Admitting: Oncology

## 2014-05-09 ENCOUNTER — Ambulatory Visit: Payer: BC Managed Care – PPO

## 2014-05-09 NOTE — Progress Notes (Signed)
Location of Breast Cancer: left breast cancer  Histology per Pathology Report:   01/16/14 Diagnosis 1. Breast, lumpectomy, Left - INVASIVE DUCTAL CARCINOMA, SEE COMMENT. - POSITIVE FOR LYMPH VASCULAR INVASION. - INVASIVE TUMOR IS GREATER THAN 1 CM FROM ANY SURGICAL MARGIN. - SEE TUMOR SYNOPTIC TEMPLATE BELOW. 2. Lymph node, sentinel, biopsy, Left Axillary #1 - ONE LYMPH NODE, NEGATIVE FOR TUMOR (0/1). 3. Lymph node, sentinel, biopsy, Left Axillary #2 - ONE LYMPH NODE, NEGATIVE FOR TUMOR (0/1).  Receptor Status: ER(negative), PR (negative), Her2-neu (negative)  Did patient present with symptoms (if so, please note symptoms) or was this found on screening mammography?: screening mammogram  Past/Anticipated interventions by surgeon, if any: 01/16/14 - Procedure: PARTIAL MASTECTOMY WITH NEEDLE LOCALIZATION AND AXILLARY SENTINEL LYMPH NODE BIOPSY;  Surgeon: [name], MD;  Location: [facility];  Service: General;  Laterality: Left;  Past/Anticipated interventions by medical oncology, if any: She is currently day 9 cycle 3 of 4 planned q. three-week doses of adjuvant docetaxel/cyclophosphamide.  Last dose was Monday.  Lymphedema issues, if any:  no    Pain issues, if any:  no   SAFETY ISSUES:  Prior radiation? no  Pacemaker/ICD? no  Possible current pregnancy?no  Is the patient on methotrexate? no  Current Complaints / other details:  Menarche age 21, first live birth age 89, the patient is [place] P2. She underwent menopause at age 27, and has been on hormone replacement for the last 23 years (estradiol and norethindrone most recently). This was stopped 12/17/2012.          Jacqulyn Liner, RN 05/09/2014,9:18 AM

## 2014-05-15 ENCOUNTER — Ambulatory Visit (HOSPITAL_BASED_OUTPATIENT_CLINIC_OR_DEPARTMENT_OTHER): Payer: BC Managed Care – PPO

## 2014-05-15 ENCOUNTER — Ambulatory Visit: Payer: BC Managed Care – PPO | Admitting: Nutrition

## 2014-05-15 ENCOUNTER — Encounter: Payer: Self-pay | Admitting: Adult Health

## 2014-05-15 ENCOUNTER — Other Ambulatory Visit (HOSPITAL_BASED_OUTPATIENT_CLINIC_OR_DEPARTMENT_OTHER): Payer: BC Managed Care – PPO

## 2014-05-15 ENCOUNTER — Ambulatory Visit (HOSPITAL_BASED_OUTPATIENT_CLINIC_OR_DEPARTMENT_OTHER): Payer: BC Managed Care – PPO | Admitting: Adult Health

## 2014-05-15 VITALS — BP 121/75 | HR 85 | Temp 98.3°F | Resp 18 | Ht 62.0 in | Wt 157.6 lb

## 2014-05-15 DIAGNOSIS — C50412 Malignant neoplasm of upper-outer quadrant of left female breast: Secondary | ICD-10-CM

## 2014-05-15 DIAGNOSIS — Z5111 Encounter for antineoplastic chemotherapy: Secondary | ICD-10-CM

## 2014-05-15 DIAGNOSIS — C50419 Malignant neoplasm of upper-outer quadrant of unspecified female breast: Secondary | ICD-10-CM

## 2014-05-15 DIAGNOSIS — Z171 Estrogen receptor negative status [ER-]: Secondary | ICD-10-CM

## 2014-05-15 DIAGNOSIS — C50919 Malignant neoplasm of unspecified site of unspecified female breast: Secondary | ICD-10-CM

## 2014-05-15 LAB — COMPREHENSIVE METABOLIC PANEL (CC13)
ALT: 12 U/L (ref 0–55)
AST: 11 U/L (ref 5–34)
Albumin: 3.6 g/dL (ref 3.5–5.0)
Alkaline Phosphatase: 51 U/L (ref 40–150)
Anion Gap: 9 mEq/L (ref 3–11)
BUN: 12 mg/dL (ref 7.0–26.0)
CO2: 23 mEq/L (ref 22–29)
Calcium: 9 mg/dL (ref 8.4–10.4)
Chloride: 105 mEq/L (ref 98–109)
Creatinine: 0.7 mg/dL (ref 0.6–1.1)
Glucose: 116 mg/dl (ref 70–140)
Potassium: 3.7 mEq/L (ref 3.5–5.1)
Sodium: 136 mEq/L (ref 136–145)
Total Bilirubin: 0.46 mg/dL (ref 0.20–1.20)
Total Protein: 6.3 g/dL — ABNORMAL LOW (ref 6.4–8.3)

## 2014-05-15 LAB — CBC WITH DIFFERENTIAL/PLATELET
BASO%: 0.3 % (ref 0.0–2.0)
Basophils Absolute: 0 10*3/uL (ref 0.0–0.1)
EOS%: 0 % (ref 0.0–7.0)
Eosinophils Absolute: 0 10*3/uL (ref 0.0–0.5)
HCT: 32.4 % — ABNORMAL LOW (ref 34.8–46.6)
HGB: 10.8 g/dL — ABNORMAL LOW (ref 11.6–15.9)
LYMPH%: 8.2 % — ABNORMAL LOW (ref 14.0–49.7)
MCH: 32.7 pg (ref 25.1–34.0)
MCHC: 33.4 g/dL (ref 31.5–36.0)
MCV: 97.9 fL (ref 79.5–101.0)
MONO#: 1.1 10*3/uL — ABNORMAL HIGH (ref 0.1–0.9)
MONO%: 7.7 % (ref 0.0–14.0)
NEUT#: 12.2 10*3/uL — ABNORMAL HIGH (ref 1.5–6.5)
NEUT%: 83.8 % — ABNORMAL HIGH (ref 38.4–76.8)
Platelets: 364 10*3/uL (ref 145–400)
RBC: 3.31 10*6/uL — ABNORMAL LOW (ref 3.70–5.45)
RDW: 17.6 % — ABNORMAL HIGH (ref 11.2–14.5)
WBC: 14.5 10*3/uL — ABNORMAL HIGH (ref 3.9–10.3)
lymph#: 1.2 10*3/uL (ref 0.9–3.3)

## 2014-05-15 MED ORDER — HEPARIN SOD (PORK) LOCK FLUSH 100 UNIT/ML IV SOLN
500.0000 [IU] | Freq: Once | INTRAVENOUS | Status: AC | PRN
  Administered 2014-05-15: 500 [IU]
  Filled 2014-05-15: qty 5

## 2014-05-15 MED ORDER — DEXAMETHASONE SODIUM PHOSPHATE 20 MG/5ML IJ SOLN
INTRAMUSCULAR | Status: AC
  Filled 2014-05-15: qty 5

## 2014-05-15 MED ORDER — DEXAMETHASONE SODIUM PHOSPHATE 20 MG/5ML IJ SOLN
20.0000 mg | Freq: Once | INTRAMUSCULAR | Status: AC
  Administered 2014-05-15: 20 mg via INTRAVENOUS

## 2014-05-15 MED ORDER — SODIUM CHLORIDE 0.9 % IJ SOLN
10.0000 mL | INTRAMUSCULAR | Status: DC | PRN
  Administered 2014-05-15: 10 mL
  Filled 2014-05-15: qty 10

## 2014-05-15 MED ORDER — SODIUM CHLORIDE 0.9 % IV SOLN
600.0000 mg/m2 | Freq: Once | INTRAVENOUS | Status: AC
  Administered 2014-05-15: 1040 mg via INTRAVENOUS
  Filled 2014-05-15: qty 52

## 2014-05-15 MED ORDER — DOCETAXEL CHEMO INJECTION 160 MG/16ML
75.0000 mg/m2 | Freq: Once | INTRAVENOUS | Status: AC
  Administered 2014-05-15: 130 mg via INTRAVENOUS
  Filled 2014-05-15: qty 13

## 2014-05-15 MED ORDER — CLONAZEPAM 1 MG PO TABS: 1.0000 mg | ORAL_TABLET | Freq: Every evening | ORAL | Status: DC | PRN

## 2014-05-15 MED ORDER — ONDANSETRON 16 MG/50ML IVPB (CHCC)
16.0000 mg | Freq: Once | INTRAVENOUS | Status: AC
  Administered 2014-05-15: 16 mg via INTRAVENOUS

## 2014-05-15 MED ORDER — SODIUM CHLORIDE 0.9 % IV SOLN
Freq: Once | INTRAVENOUS | Status: AC
  Administered 2014-05-15: 15:00:00 via INTRAVENOUS

## 2014-05-15 MED ORDER — ONDANSETRON 16 MG/50ML IVPB (CHCC)
INTRAVENOUS | Status: AC
  Filled 2014-05-15: qty 16

## 2014-05-15 NOTE — Patient Instructions (Signed)
[facility] Discharge Instructions for Patients Receiving Chemotherapy  Today you received the following chemotherapy agents Taxotere/Cytoxan.  To help prevent nausea and vomiting after your treatment, we encourage you to take your nausea medication as prescribed.   If you develop nausea and vomiting that is not controlled by your nausea medication, call the clinic.   BELOW ARE SYMPTOMS THAT SHOULD BE REPORTED IMMEDIATELY:  *FEVER GREATER THAN 100.5 F  *CHILLS WITH OR WITHOUT FEVER  NAUSEA AND VOMITING THAT IS NOT CONTROLLED WITH YOUR NAUSEA MEDICATION  *UNUSUAL SHORTNESS OF BREATH  *UNUSUAL BRUISING OR BLEEDING  TENDERNESS IN MOUTH AND THROAT WITH OR WITHOUT PRESENCE OF ULCERS  *URINARY PROBLEMS  *BOWEL PROBLEMS  UNUSUAL RASH Items with * indicate a potential emergency and should be followed up as soon as possible.  Feel free to call the clinic you have any questions or concerns. The clinic phone number is [phone].    

## 2014-05-15 NOTE — Progress Notes (Signed)
Patient is receiving chemotherapy for breast cancer. Nutrition follow-up completed with pt in the chemotherapy room. Pt continues to have bilateral edema, but she reports that it is improving. Weight decreased from 158.6 lbs on 5/18 to 152 lbs this visit. She reports that this weight loss is from fluid loss. She continues to eat a bland diet with minimal protein for the first 4 days after chemotherapy (cereals, crackers) because she says she has a fear of nausea and vomiting. She tried to eat six small meals per day. She also reports that she has decreased her intake of sodium. Pt purchased Ensure, but has not tried them yet because she doesn't want them to make her nauseated. She also complains of a metallic taste in her mouth after chemo.   Nutrition diagnosis: food and nutrition related knowledge deficit continues.  Intervention: Pt was encouraged to incorporate high-protein foods into her diet after chemo. She was also re-educated on the importance of a low sodium diet for decreasing the swelling in her legs. Pt was educated on ways to deal with taste changes. Encouraged weight maintenance. Teach-back method used. Questions were answered.   Monitoring, evaluation, goals: Patient will tolerate increased protein, low-sodium diet for improvement in edema, and weight maintenance.  Next Visit: Pt felt that she needed no further nutritional information/education at this time. She was provided with contact information for RD and was encouraged to schedule a follow-up appointment if further education was needed.

## 2014-05-15 NOTE — Progress Notes (Signed)
[place]  Telephone:(336) [phone] Fax:(336) [phone]     ID: Sheri Arellano OB: 11-Oct-1951  MR#: [id number]  TKP#:[id number]  PCP: [name], MD GYN:  Olena Mater SU: Fanny Skates OTHER MD: Gery Pray, Crista Luria  CHIEF COMPLAINT:  Left Breast Cancer/Adjuvant Chemotherapy (docetaxel/cyclophosphamide)   BREAST CANCER HISTORY:: Sheri Arellano had routine screening mammography at [facility] 11/21/2013 showing an irregular density in the left breast at the 1:00 position. Ultrasound was obtained 11/29/2013 and showed a 4 mm tolerate them live irregular mass in the left breast at the 2:00 position. This was hypoechoic. Biopsy of this area 12/12/2013 showed (SAA [id number]) an invasive ductal carcinoma, grade 1 or 2, estrogen receptor 0%, progesterone receptor 0%, with no HER-2 amplification, the signals ratio being 1.09 and the number per cell be 1.95. The MIB-1-1 was 72%.  The patient subsequent history is as detailed below  INTERVAL HISTORY: Sheri Arellano returns alone today  for followup of her left breast cancer. She is currently day 1 cycle 4 of 4 planned q. three-week doses of adjuvant docetaxel/cyclophosphamide which she is tolerating extremely well. She received Neulasta on day 2 for granulocyte support.  Thje patient is doing well today.  She is excited that she is here for her very last treatment.  She has found a solution to her insomnia.  She takes $RemoveB'3mg'hJMRHadc$  Klonipin the first week at night, then $RemoveBef'2mg'VAIGubsIcu$ , then 1 mg.  She is otherwise feeling well.  She does continue to have some difficulty with fluid retention, however, her feet do go back to normal in the morning upon waking.  She denies fevers, chills, nausea, vomiting, constipation, diarrhea, numbness/tingling, mouth pain, or any further concerns.    REVIEW OF SYSTEMS: A 10 point review of systems was conducted and is otherwise negative except for what is noted above.     PAST MEDICAL HISTORY: Past Medical History  Diagnosis  Date  . Esophageal dysmotility   . Anxiety     probable panic attacks  . Insomnia   . GERD (gastroesophageal reflux disease)   . Hyperlipidemia   . Dysphagia   . Vitamin D deficiency   . Other malaise and fatigue   . SVT (supraventricular tachycardia)     documented-takes metoprolol as needed  . H/O hiatal hernia   . Cancer     breast  . Iron deficiency anemia     hx    PAST SURGICAL HISTORY: Past Surgical History  Procedure Laterality Date  . Carpal tunnel release      lt and rt  . Cholecystectomy  1999  . Partial mastectomy with needle localization and axillary sentinel lymph node bx Left 01/16/2014    Procedure: PARTIAL MASTECTOMY WITH NEEDLE LOCALIZATION AND AXILLARY SENTINEL LYMPH NODE BIOPSY;  Surgeon: [name], MD;  Location: [place];  Service: General;  Laterality: Left;  . Portacath placement Right 02/28/2014    Procedure: INSERTION PORT-A-CATH;  Surgeon: [name], MD;  Location: [place];  Service: General;  Laterality: Right;    FAMILY HISTORY Family History  Problem Relation Age of Onset  . Atrial fibrillation Mother   . Hyperlipidemia Mother   . Hypertension Mother   . Cancer Father     bladder  . Cancer Maternal Aunt     breast   the patient's parents are living, both in their 40s. The patient had one brother and one no sisters. One of the patient's mothers 3 sisters was diagnosed with breast cancer before the age of  50. There is no history of ovarian cancer in the family.  GYNECOLOGIC HISTORY:  (Reviewed 05/02/2014) Menarche age 77, first live birth age 25, the patient is [place] P2. She underwent menopause at age 51, and has been on hormone replacement for the last 23 years (estradiol and norethindrone most recently). 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 [name] used to work for [employer] but is currently retired. Their son date lives in [place] where he owns an Electronics engineer  and [employer] [place] products. Son Ruby Cola lives in [place], currently working for [employer]. The patient has 3 grandchildren. She is a Furniture conservator/restorer.   ADVANCED DIRECTIVES: In place   HEALTH MAINTENANCE: (Reviewed 05/02/2014) History  Substance Use Topics  . Smoking status: Former Smoker -- 1.50 packs/day for 8 years    Types: Cigarettes    Quit date: 01/22/1976  . Smokeless tobacco: Former Systems developer    Quit date: 02/16/1975  . Alcohol Use: 4.8 oz/week    1 Glasses of wine, 7 Shots of liquor per week     Comment: rarely     Colonoscopy: Not on file  PAP: March 2014, Dr. Inda Merlin  Bone density: Not on file  Lipid panel: Not on file   Allergies  Allergen Reactions  . Erythromycin Base Other (See Comments)    Stomach ache    Current Outpatient Prescriptions  Medication Sig Dispense Refill  . aspirin EC 81 MG tablet Take 81 mg by mouth daily.      . Cholecalciferol (VITAMIN D) 2000 UNITS tablet Take 2,000 Units by mouth daily.      . clonazePAM (KLONOPIN) 1 MG tablet Take 1 tablet (1 mg total) by mouth at bedtime as needed (sleep).  60 tablet  0  . dexamethasone (DECADRON) 4 MG tablet TAKE TWO TABLETS BY MOUTH TWICE DAILY. START THE DAY BEFORE TAXOTERE. THEN AGAIN THE DAY AFTER CHEMO FOR 3 DAYS   30 tablet  0  . doxycycline (MONODOX) 100 MG capsule       . lidocaine-prilocaine (EMLA) cream Apply 1 application topically as needed.  30 g  0  . loratadine (CLARITIN) 10 MG tablet Take 10 mg by mouth daily.      . metoprolol tartrate (LOPRESSOR) 25 MG tablet Take 25 mg by mouth as needed (tachycardia).       . pantoprazole (PROTONIX) 40 MG tablet Take 40 mg by mouth daily.      . sertraline (ZOLOFT) 100 MG tablet Take 50 mg by mouth daily.       . simvastatin (ZOCOR) 40 MG tablet Take 20 mg by mouth at bedtime.       . docusate sodium (COLACE) 100 MG capsule Take 100 mg by mouth 2 (two) times daily.      Marland Kitchen LORazepam (ATIVAN) 0.5 MG tablet Take 1 tablet (0.5 mg  total) by mouth every 6 (six) hours as needed (Nausea or vomiting).  30 tablet  0  . nitroGLYCERIN (NITROSTAT) 0.4 MG SL tablet Place 0.4 mg under the tongue every 5 (five) minutes as needed (Esophageal spasms).       . ondansetron (ZOFRAN) 8 MG tablet Take 1 tablet (8 mg total) by mouth 2 (two) times daily. Start the day after chemo for 3 days. Then take as needed for nausea or vomiting.  30 tablet  1  . prochlorperazine (COMPAZINE) 10 MG tablet Take 1 tablet (10 mg total) by mouth every  6 (six) hours as needed (Nausea or vomiting).  30 tablet  1   No current facility-administered medications for this visit.    OBJECTIVE: Middle-aged white woman who appears comfortable and is in no acute distress Filed Vitals:   05/15/14 1425  BP: 121/75  Pulse: 85  Temp: 98.3 F (36.8 C)  Resp: 18     Body mass index is 28.81 kg/(m^2).    ECOG FS: 0 Filed Weights   05/15/14 1425  Weight: 157 lb 9 oz (71.47 kg)   Physical Exam: HEENT:  Sclerae anicteric.  Oropharynx clear, pink, and moist with no ulcerations or mucositis. No evidence of oropharyngeal candidiasis.  Neck is supple, trachea midline. No thyromegaly.  NODES:  No cervical or supraclavicular lymphadenopathy palpated.  BREAST EXAM:  Right breast is unremarkable. Left breast is status post lumpectomy with no suspicious nodularities or skin changes, no evidence of local recurrence. Axillae are benign bilaterally with no palpable lymphadenopathy. LUNGS:  Clear to auscultation bilaterally with good excursion.  No, wheezes or rhonchi HEART:  Regular rate and rhythm. No murmur appreciated. ABDOMEN:  Soft, nontender. No organomegaly or masses palpated.  Positive bowel sounds.   MSK:  No focal spinal tenderness to palpation. Good range of motion bilaterally in the upper extremities. EXTREMITIES:   No peripheral edema. Pedal edema has completely resolved. No lymphedema noted in the left upper extremity. SKIN:  In each axilla, there are  well-circumscribed skin lesions, approximately 2 cm in diameter, erythematous and dry. No vesicles or pustules are noted. No additional rashes are visible on exam today. No nail dyscrasia. No pallor. No excessive ecchymoses. No petechiae.  NEURO:  Nonfocal. Well oriented.  Appropriate affect.    LAB RESULTS:  Lab Results  Component Value Date   WBC 14.5* 05/15/2014   NEUTROABS 12.2* 05/15/2014   HGB 10.8* 05/15/2014   HCT 32.4* 05/15/2014   MCV 97.9 05/15/2014   PLT 364 05/15/2014      Chemistry      Component Value Date/Time   NA 136 05/15/2014 1413   NA 140 02/27/2014 1420   K 3.7 05/15/2014 1413   K 3.4* 02/27/2014 1420   CL 100 02/27/2014 1420   CO2 23 05/15/2014 1413   CO2 27 02/27/2014 1420   BUN 12.0 05/15/2014 1413   BUN 12 02/27/2014 1420   CREATININE 0.7 05/15/2014 1413   CREATININE 0.78 02/27/2014 1420      Component Value Date/Time   CALCIUM 9.0 05/15/2014 1413   CALCIUM 9.3 02/27/2014 1420   ALKPHOS 51 05/15/2014 1413   AST 11 05/15/2014 1413   ALT 12 05/15/2014 1413   BILITOT 0.46 05/15/2014 1413       STUDIES:  Lower Extremity Venous Duplex Bilateral 04/21/2014 - No obvious evidence of deep vein or superficial thrombosis involving the right lower extremity and left lower extremity. - No evidence of Baker's cyst on the right or left.   DG Abd 1 View 03/20/2014  CLINICAL DATA: Constipation. History of breast cancer.  EXAM:  ABDOMEN - 1 VIEW  COMPARISON: None.  FINDINGS:  The bowel gas pattern is normal. No significant stool burden or  impaction. No radio-opaque calculi or other significant radiographic  abnormality are seen.  IMPRESSION:  No evidence of bowel obstruction or significant stool burden.  Electronically Signed  By: Aletta Edouard M.D.  On: 03/20/2014 16:09       ASSESSMENT: 63 y.o.  woman status post left breast biopsy 12/12/2013 for a clinical T1a N0 invasive ductal  carcinoma, low to intermediate grade, triple negative, with an MIB-1 of 72%  (1)  status post left lumpectomy and sentinel lymph node sampling 01/16/2014 for a pT1c pN0, stage IA invasive ductal carcinoma, grade 3, repeat prognostic panel again triple negative.  (2)  receiving adjuvant chemotherapy to consist of 4 cycles of docetaxel/cyclophosphamide given on a Q. three-week basis, first dose given on 03/13/2014. Neulasta given on day 2 for granulocyte support.  (3) adjuvant chemotherapy to be followed with radiation therapy under the care of Dr. Sondra Come.    PLAN: Sheri Arellano is doing well today.  Her labs are stable.  I reviewed them with her in detail.  She will proceed with her final cycle of Docetaxel and Cyclophosphamide.  I refilled Klonipin x 1 today for her sleep #60.     The patient will return in one week for labs and evaluation of chemotoxicites.  All the above was reviewed in detail with [facility] today. She voices both her understanding and agreement with our plan, and will call with any changes or problems prior to her next scheduled appointment.  I spent 25 minutes counseling the patient face to face.  The total time spent in the appointment was 30 minutes.  Minette Headland, [place] [phone] 05/16/2014 5:55 PM

## 2014-05-16 ENCOUNTER — Ambulatory Visit (HOSPITAL_BASED_OUTPATIENT_CLINIC_OR_DEPARTMENT_OTHER): Payer: BC Managed Care – PPO

## 2014-05-16 VITALS — BP 103/60 | HR 63 | Temp 97.3°F

## 2014-05-16 DIAGNOSIS — C50412 Malignant neoplasm of upper-outer quadrant of left female breast: Secondary | ICD-10-CM

## 2014-05-16 DIAGNOSIS — C50419 Malignant neoplasm of upper-outer quadrant of unspecified female breast: Secondary | ICD-10-CM

## 2014-05-16 DIAGNOSIS — Z5189 Encounter for other specified aftercare: Secondary | ICD-10-CM

## 2014-05-16 MED ORDER — PEGFILGRASTIM INJECTION 6 MG/0.6ML
6.0000 mg | Freq: Once | SUBCUTANEOUS | Status: AC
  Administered 2014-05-16: 6 mg via SUBCUTANEOUS
  Filled 2014-05-16: qty 0.6

## 2014-05-17 ENCOUNTER — Ambulatory Visit
Admission: RE | Admit: 2014-05-17 | Discharge: 2014-05-17 | Disposition: A | Discharge status: Home / Self Care | Payer: BC Managed Care – PPO | Source: Ambulatory Visit | Attending: Radiation Oncology | Admitting: Radiation Oncology

## 2014-05-17 ENCOUNTER — Encounter: Payer: Self-pay | Admitting: Radiation Oncology

## 2014-05-17 VITALS — BP 111/72 | HR 76 | Temp 98.3°F | Ht 62.0 in | Wt 153.4 lb

## 2014-05-17 DIAGNOSIS — Z9221 Personal history of antineoplastic chemotherapy: Secondary | ICD-10-CM | POA: Diagnosis not present

## 2014-05-17 DIAGNOSIS — Z7982 Long term (current) use of aspirin: Secondary | ICD-10-CM | POA: Diagnosis not present

## 2014-05-17 DIAGNOSIS — Z510 Encounter for antineoplastic radiation therapy: Secondary | ICD-10-CM | POA: Diagnosis not present

## 2014-05-17 DIAGNOSIS — C50412 Malignant neoplasm of upper-outer quadrant of left female breast: Secondary | ICD-10-CM

## 2014-05-17 DIAGNOSIS — Z79899 Other long term (current) drug therapy: Secondary | ICD-10-CM | POA: Diagnosis not present

## 2014-05-17 DIAGNOSIS — C50419 Malignant neoplasm of upper-outer quadrant of unspecified female breast: Secondary | ICD-10-CM | POA: Insufficient documentation

## 2014-05-17 DIAGNOSIS — Z171 Estrogen receptor negative status [ER-]: Secondary | ICD-10-CM | POA: Diagnosis not present

## 2014-05-17 NOTE — Progress Notes (Signed)
Please see the Nurse Progress Note in the MD Initial Consult Encounter for this patient. 

## 2014-05-17 NOTE — Progress Notes (Signed)
Radiation Oncology         [phone] ________________________________  Name: Sheri Arellano MRN: [id number]  Date: 05/17/2014  DOB: 02-02-1951  Follow-Up Visit Note  CC: [name], MD  Magrinat, Virgie Dad, MD  Diagnosis:  Stage I invasive ductal carcinoma of the left breast (triple negative, pT1c, pN0, Mx)    Narrative:  The patient returns today for further evaluation. Patient recently completed her adjuvant chemotherapy. She overall tolerated this extremely well.  She denies any consistent pain within the left breast nipple discharge or bleeding. She denies any problems with swelling in her left arm or hand. Patient has continued to work throughout her chemotherapy                            ALLERGIES:  is allergic to erythromycin base.  Meds: Current Outpatient Prescriptions  Medication Sig Dispense Refill  . aspirin EC 81 MG tablet Take 81 mg by mouth daily.      . Cholecalciferol (VITAMIN D) 2000 UNITS tablet Take 2,000 Units by mouth daily.      . clonazePAM (KLONOPIN) 1 MG tablet Take 1 tablet (1 mg total) by mouth at bedtime as needed (sleep).  60 tablet  0  . dexamethasone (DECADRON) 4 MG tablet TAKE TWO TABLETS BY MOUTH TWICE DAILY. START THE DAY BEFORE TAXOTERE. THEN AGAIN THE DAY AFTER CHEMO FOR 3 DAYS   30 tablet  0  . docusate sodium (COLACE) 100 MG capsule Take 100 mg by mouth 2 (two) times daily.      Marland Kitchen doxycycline (MONODOX) 100 MG capsule       . lidocaine-prilocaine (EMLA) cream Apply 1 application topically as needed.  30 g  0  . loratadine (CLARITIN) 10 MG tablet Take 10 mg by mouth daily.      . metoprolol tartrate (LOPRESSOR) 25 MG tablet Take 25 mg by mouth as needed (tachycardia).       . nitroGLYCERIN (NITROSTAT) 0.4 MG SL tablet Place 0.4 mg under the tongue every 5 (five) minutes as needed (Esophageal spasms).       . pantoprazole (PROTONIX) 40 MG tablet Take 40 mg by mouth daily.      . sertraline (ZOLOFT) 100 MG tablet Take 50 mg by mouth  daily.       . simvastatin (ZOCOR) 40 MG tablet Take 20 mg by mouth at bedtime.        No current facility-administered medications for this encounter.    Physical Findings: The patient is in no acute distress. Patient is alert and oriented.  height is 5\' 2"  (1.575 m) and weight is 153 lb 6.4 oz (69.582 kg). Her oral temperature is 98.3 F (36.8 C). Her blood pressure is 111/72 and her pulse is 76. Marland Kitchen  No palpable supraclavicular or axillary adenopathy. The lungs clear to auscultation. The heart has a regular rhythm and rate. Summation of the right breast reveals no mass or nipple discharge. Examination of the left breast reveals a well-healed scar in the lateral aspect of the breast. There is a separate scar in the axillary  procedure. No dominant masses appreciated breast nipple discharge or bleeding.  Lab Findings: Lab Results  Component Value Date   WBC 14.5* 05/15/2014   HGB 10.8* 05/15/2014   HCT 32.4* 05/15/2014   MCV 97.9 05/15/2014   PLT 364 05/15/2014    Radiographic Findings: No results found.  Impression:  Stage I invasive ductal carcinoma of the  left breast (triple negative, pT1c, pN0, Mx).  Patient is now ready to proceed with radiation therapy as breast conservation treatment.    Plan:  Simulation and planning on June 16 with treatments to begin approximately 2-1/2-3 weeks out from chemotherapy.  ____________________________________ Blair Promise, MD

## 2014-05-18 ENCOUNTER — Encounter: Payer: Self-pay | Admitting: Oncology

## 2014-05-18 NOTE — Addendum Note (Signed)
Encounter addended by: Jacqulyn Liner, RN on: 05/18/2014 12:02 PM<BR>     Documentation filed: Visit Diagnoses

## 2014-05-18 NOTE — Progress Notes (Signed)
Insurance is paying Neulasta 100% °

## 2014-05-22 ENCOUNTER — Other Ambulatory Visit: Payer: Self-pay | Admitting: *Deleted

## 2014-05-22 ENCOUNTER — Other Ambulatory Visit: Payer: BC Managed Care – PPO

## 2014-05-22 ENCOUNTER — Telehealth: Payer: Self-pay | Admitting: *Deleted

## 2014-05-22 ENCOUNTER — Telehealth: Payer: Self-pay | Admitting: Physician Assistant

## 2014-05-22 ENCOUNTER — Telehealth: Payer: Self-pay | Admitting: Oncology

## 2014-05-22 ENCOUNTER — Other Ambulatory Visit: Payer: Self-pay | Admitting: Physician Assistant

## 2014-05-22 ENCOUNTER — Ambulatory Visit: Payer: BC Managed Care – PPO | Admitting: Physician Assistant

## 2014-05-22 DIAGNOSIS — C50412 Malignant neoplasm of upper-outer quadrant of left female breast: Secondary | ICD-10-CM

## 2014-05-22 NOTE — Telephone Encounter (Signed)
Followed up from call this am to see why pt didn't show for appt. Communicated with pt and she stated, " I just needed a day off,I am fine". I have rescheduled pt for lab tomorrow @10 :30a for CBC only per Micah Flesher and I have told the pt this as well. Pt said," we made her day and will be here for lab appt tomorrow". Pt verbalized understanding. Message to be forwarded to [employer], PA-C.

## 2014-05-22 NOTE — Telephone Encounter (Signed)
per pof to sch pt appt-pt aware Natro cld pt to adv

## 2014-05-22 NOTE — Telephone Encounter (Signed)
, °

## 2014-05-22 NOTE — Telephone Encounter (Signed)
Called pt to inquire about missed appt on today. No answer, but left a detailed message to pt's VM to call this nurse back @ [phone]. I also told the pt there is a opening  tomorrow at 11:15 on PA-C's schedule if she is able to r/s. I will  continue to call pt until I reach her and can talk person to person. Message to be forwarded to [employer], PA-C.

## 2014-05-23 ENCOUNTER — Ambulatory Visit
Admission: RE | Admit: 2014-05-23 | Discharge: 2014-05-23 | Disposition: A | Discharge status: Home / Self Care | Payer: BC Managed Care – PPO | Source: Ambulatory Visit | Attending: Radiation Oncology | Admitting: Radiation Oncology

## 2014-05-23 ENCOUNTER — Telehealth: Payer: Self-pay | Admitting: *Deleted

## 2014-05-23 ENCOUNTER — Other Ambulatory Visit (HOSPITAL_BASED_OUTPATIENT_CLINIC_OR_DEPARTMENT_OTHER): Payer: BC Managed Care – PPO

## 2014-05-23 DIAGNOSIS — C50412 Malignant neoplasm of upper-outer quadrant of left female breast: Secondary | ICD-10-CM

## 2014-05-23 DIAGNOSIS — Z510 Encounter for antineoplastic radiation therapy: Secondary | ICD-10-CM | POA: Diagnosis not present

## 2014-05-23 DIAGNOSIS — C50419 Malignant neoplasm of upper-outer quadrant of unspecified female breast: Secondary | ICD-10-CM

## 2014-05-23 LAB — CBC WITH DIFFERENTIAL/PLATELET
BASO%: 0.6 % (ref 0.0–2.0)
Basophils Absolute: 0.1 10*3/uL (ref 0.0–0.1)
EOS%: 0.6 % (ref 0.0–7.0)
Eosinophils Absolute: 0.1 10*3/uL (ref 0.0–0.5)
HCT: 33.9 % — ABNORMAL LOW (ref 34.8–46.6)
HGB: 11.5 g/dL — ABNORMAL LOW (ref 11.6–15.9)
LYMPH%: 13.2 % — ABNORMAL LOW (ref 14.0–49.7)
MCH: 33 pg (ref 25.1–34.0)
MCHC: 33.9 g/dL (ref 31.5–36.0)
MCV: 97.4 fL (ref 79.5–101.0)
MONO#: 1.8 10*3/uL — ABNORMAL HIGH (ref 0.1–0.9)
MONO%: 15.5 % — ABNORMAL HIGH (ref 0.0–14.0)
NEUT#: 8.3 10*3/uL — ABNORMAL HIGH (ref 1.5–6.5)
NEUT%: 70.1 % (ref 38.4–76.8)
Platelets: 274 10*3/uL (ref 145–400)
RBC: 3.48 10*6/uL — ABNORMAL LOW (ref 3.70–5.45)
RDW: 16.4 % — ABNORMAL HIGH (ref 11.2–14.5)
WBC: 11.8 10*3/uL — ABNORMAL HIGH (ref 3.9–10.3)
lymph#: 1.6 10*3/uL (ref 0.9–3.3)

## 2014-05-23 NOTE — Progress Notes (Signed)
  Radiation Oncology         [phone] ________________________________  Name: Sheri Arellano MRN: [id number]  Date: 05/23/2014  DOB: [birth year]  RESPIRATORY MOTION MANAGEMENT SIMULATION  NARRATIVE:  In order to account for effect of respiratory motion on target structures and other organs in the planning and delivery of radiotherapy, this patient underwent respiratory motion management simulation.  To accomplish this, when the patient was brought to the CT simulation planning suite, 4D respiratoy motion management CT images were obtained.  The CT images were loaded into the planning software.  Then, using a variety of tools including Cine, MIP, and standard views, the target volume and planning target volumes (PTV) were delineated.  Avoidance structures were contoured.  Treatment planning then occurred.  Dose volume histograms were generated and reviewed for each of the requested structure.  The resulting plan was carefully reviewed and approved today.  -----------------------------------  Blair Promise, PhD, MD

## 2014-05-23 NOTE — Progress Notes (Signed)
  Radiation Oncology         [phone] ________________________________  Name: Sheri Arellano MRN: [id number]  Date: 05/23/2014  DOB: 1951-07-11  SIMULATION AND TREATMENT PLANNING NOTE  DIAGNOSIS: Stage I invasive ductal carcinoma of the left breast (triple negative, pT1c, pN0, Mx)    NARRATIVE:  The patient was brought to the [facility].  Identity was confirmed.  All relevant records and images related to the planned course of therapy were reviewed.  The patient freely provided informed written consent to proceed with treatment after reviewing the details related to the planned course of therapy. The consent form was witnessed and verified by the simulation staff.  Then, the patient was set-up in a stable reproducible  supine position for radiation therapy.  CT images were obtained.  Surface markings were placed.  The CT images were loaded into the planning software.  Then the target and avoidance structures were contoured.  Treatment planning then occurred.  The radiation prescription was entered and confirmed.  Then, I designed and supervised the construction of a total of 3 medically necessary complex treatment devices.  I have requested : 3D Simulation  I have requested a DVH of the following structures: heart, lungs, lumpectomy cavity.  I have ordered:dose calc.  PLAN:  The patient will receive 50.4 Gy in 28 fractions followed by a boost to the lumpectomy cavity of 10 gray for a cumulative dose of 60.4 gray  ________________________________  -----------------------------------  Blair Promise, PhD, MD

## 2014-05-23 NOTE — Progress Notes (Signed)
  Radiation Oncology         [phone] ________________________________  Name: Danelia Snodgrass MRN: [id number]  Date: 05/23/2014  DOB: 1951/06/29  Optical Surface Tracking Plan:  Since intensity modulated radiotherapy (IMRT) and 3D conformal radiation treatment methods are predicated on accurate and precise positioning for treatment, intrafraction motion monitoring is medically necessary to ensure accurate and safe treatment delivery.  The ability to quantify intrafraction motion without excessive ionizing radiation dose can only be performed with optical surface tracking. Accordingly, surface imaging offers the opportunity to obtain 3D measurements of patient position throughout IMRT and 3D treatments without excessive radiation exposure.  I am ordering optical surface tracking for this patient's upcoming course of radiotherapy. ________________________________  Blair Promise, MD 05/23/2014 7:20 PM    Reference:   Ursula Alert, J, et al. Surface imaging-based analysis of intrafraction motion for breast radiotherapy patients.Journal of [place], n. 6, nov. 2014. ISSN [id number].   Available at: <http://www.jacmp.org/index.php/jacmp/article/view/4957>.

## 2014-05-23 NOTE — Telephone Encounter (Signed)
Called pt to inform her of lab results. No answer, but I left a detailed message letting pt know ALL labs were WNL. If pt has any questions, she can call this nurse  @ [phone]. Message to be forwarded to [employer], PA-C.

## 2014-05-24 ENCOUNTER — Telehealth: Payer: Self-pay | Admitting: Oncology

## 2014-05-24 DIAGNOSIS — Z510 Encounter for antineoplastic radiation therapy: Secondary | ICD-10-CM | POA: Diagnosis not present

## 2014-05-24 NOTE — Telephone Encounter (Signed)
, °

## 2014-05-30 ENCOUNTER — Ambulatory Visit: Payer: BC Managed Care – PPO | Admitting: Radiation Oncology

## 2014-05-31 ENCOUNTER — Ambulatory Visit: Payer: BC Managed Care – PPO

## 2014-06-01 ENCOUNTER — Ambulatory Visit: Payer: BC Managed Care – PPO

## 2014-06-01 ENCOUNTER — Ambulatory Visit
Admission: RE | Admit: 2014-06-01 | Discharge: 2014-06-01 | Disposition: A | Discharge status: Home / Self Care | Payer: BC Managed Care – PPO | Source: Ambulatory Visit | Attending: Radiation Oncology | Admitting: Radiation Oncology

## 2014-06-01 ENCOUNTER — Encounter: Payer: Self-pay | Admitting: Radiation Oncology

## 2014-06-01 DIAGNOSIS — C50412 Malignant neoplasm of upper-outer quadrant of left female breast: Secondary | ICD-10-CM

## 2014-06-01 DIAGNOSIS — Z510 Encounter for antineoplastic radiation therapy: Secondary | ICD-10-CM | POA: Diagnosis not present

## 2014-06-01 MED ORDER — ALRA NON-METALLIC DEODORANT (RAD-ONC)
1.0000 "application " | Freq: Once | TOPICAL | Status: AC
  Administered 2014-06-01: 1 via TOPICAL

## 2014-06-01 MED ORDER — RADIAPLEXRX EX GEL
Freq: Once | CUTANEOUS | Status: AC
  Administered 2014-06-01: 14:00:00 via TOPICAL

## 2014-06-01 NOTE — Progress Notes (Signed)
Patient education done, rad book, alra and radiaplex gel given with Varney Baas business card,discusses side effects, skin irritation,fatigue,pain, increase protein in diet, stay hydrated,drink plenty water, sees MD Shelly Bombard weekly after rad txs and prn, teach back given, to apply skin products after rad tx and bedtime 2:14 PM

## 2014-06-02 ENCOUNTER — Ambulatory Visit: Payer: BC Managed Care – PPO

## 2014-06-02 NOTE — Progress Notes (Signed)
  Radiation Oncology         [phone] ________________________________  Name: Sheri Arellano MRN: [id number]  Date: 06/01/2014  DOB: 30-Jun-1951  Simulation Verification Note  Status: outpatient  NARRATIVE: The patient was brought to the treatment unit and placed in the planned treatment position. The clinical setup was verified. Then port films were obtained and uploaded to the radiation oncology medical record software.  The treatment beams were carefully compared against the planned radiation fields. The position location and shape of the radiation fields was reviewed. The targeted volume of tissue appears appropriately covered by the radiation beams. Organs at risk appear to be excluded as planned.  Based on my personal review, I approved the simulation verification. The patient's treatment will proceed as planned.  ------------------------------------------------  Thea Silversmith, MD

## 2014-06-05 ENCOUNTER — Ambulatory Visit: Payer: BC Managed Care – PPO

## 2014-06-05 ENCOUNTER — Ambulatory Visit
Admission: RE | Admit: 2014-06-05 | Discharge: 2014-06-05 | Disposition: A | Discharge status: Home / Self Care | Payer: BC Managed Care – PPO | Source: Ambulatory Visit | Attending: Radiation Oncology | Admitting: Radiation Oncology

## 2014-06-05 DIAGNOSIS — Z510 Encounter for antineoplastic radiation therapy: Secondary | ICD-10-CM | POA: Diagnosis not present

## 2014-06-06 ENCOUNTER — Ambulatory Visit
Admission: RE | Admit: 2014-06-06 | Discharge: 2014-06-06 | Disposition: A | Discharge status: Home / Self Care | Payer: BC Managed Care – PPO | Source: Ambulatory Visit | Attending: Radiation Oncology | Admitting: Radiation Oncology

## 2014-06-06 ENCOUNTER — Encounter: Payer: Self-pay | Admitting: Radiation Oncology

## 2014-06-06 VITALS — BP 101/68 | HR 88 | Resp 16 | Wt 157.0 lb

## 2014-06-06 DIAGNOSIS — C50412 Malignant neoplasm of upper-outer quadrant of left female breast: Secondary | ICD-10-CM

## 2014-06-06 DIAGNOSIS — Z510 Encounter for antineoplastic radiation therapy: Secondary | ICD-10-CM | POA: Diagnosis not present

## 2014-06-06 NOTE — Progress Notes (Signed)
Reports using radiaplex bid as directed. Denies skin changes or fatigue. Denies pain. Patient without complaints.

## 2014-06-06 NOTE — Progress Notes (Signed)
  Radiation Oncology         [phone] ________________________________  Name: Sheri Arellano MRN: [id number]  Date: 06/06/2014  DOB: Sep 08, 1951  Weekly Radiation Therapy Management  Breast cancer of upper-outer quadrant of left female breast   Primary site: Breast (Left)   Staging method: AJCC 7th Edition   Clinical: Stage IA (T1a, N0, cM0)   Summary: Stage IA (T1a, N0, cM0)   Clinical comments: Staged at breast conference 12/21/13.   Current Dose: 3.6 Gy     Planned Dose:  60.4 Gy  Narrative . . . . . . . . The patient presents for routine under treatment assessment.                                   The patient is without complaint.                                 Set-up films were reviewed.                                 The chart was checked. Physical Findings. . .  weight is 157 lb (71.215 kg). Her blood pressure is 101/68 and her pulse is 88. Her respiration is 16. . Weight essentially stable.  No radiation reaction within the left breast at this point. Impression . . . . . . . The patient is tolerating radiation. Plan . . . . . . . . . . . . Continue treatment as planned.  ________________________________   Blair Promise, PhD, MD

## 2014-06-07 ENCOUNTER — Ambulatory Visit
Admission: RE | Admit: 2014-06-07 | Discharge: 2014-06-07 | Disposition: A | Discharge status: Home / Self Care | Payer: BC Managed Care – PPO | Source: Ambulatory Visit | Attending: Radiation Oncology | Admitting: Radiation Oncology

## 2014-06-07 DIAGNOSIS — Z510 Encounter for antineoplastic radiation therapy: Secondary | ICD-10-CM | POA: Diagnosis not present

## 2014-06-08 ENCOUNTER — Ambulatory Visit
Admission: RE | Admit: 2014-06-08 | Discharge: 2014-06-08 | Disposition: A | Discharge status: Home / Self Care | Payer: BC Managed Care – PPO | Source: Ambulatory Visit | Attending: Radiation Oncology | Admitting: Radiation Oncology

## 2014-06-08 DIAGNOSIS — Z510 Encounter for antineoplastic radiation therapy: Secondary | ICD-10-CM | POA: Diagnosis not present

## 2014-06-12 ENCOUNTER — Ambulatory Visit
Admission: RE | Admit: 2014-06-12 | Discharge: 2014-06-12 | Disposition: A | Discharge status: Home / Self Care | Payer: BC Managed Care – PPO | Source: Ambulatory Visit | Attending: Radiation Oncology | Admitting: Radiation Oncology

## 2014-06-12 DIAGNOSIS — Z510 Encounter for antineoplastic radiation therapy: Secondary | ICD-10-CM | POA: Diagnosis not present

## 2014-06-13 ENCOUNTER — Encounter: Payer: Self-pay | Admitting: Radiation Oncology

## 2014-06-13 ENCOUNTER — Ambulatory Visit
Admission: RE | Admit: 2014-06-13 | Discharge: 2014-06-13 | Disposition: A | Discharge status: Home / Self Care | Payer: BC Managed Care – PPO | Source: Ambulatory Visit | Attending: Radiation Oncology | Admitting: Radiation Oncology

## 2014-06-13 ENCOUNTER — Ambulatory Visit: Payer: BC Managed Care – PPO | Admitting: Radiation Oncology

## 2014-06-13 VITALS — BP 107/71 | HR 80 | Temp 97.5°F | Ht 62.0 in | Wt 154.9 lb

## 2014-06-13 DIAGNOSIS — Z510 Encounter for antineoplastic radiation therapy: Secondary | ICD-10-CM | POA: Diagnosis not present

## 2014-06-13 DIAGNOSIS — C50412 Malignant neoplasm of upper-outer quadrant of left female breast: Secondary | ICD-10-CM

## 2014-06-13 NOTE — Progress Notes (Signed)
  Radiation Oncology         [phone] ________________________________  Name: Sherilynn Dieu MRN: [id number]  Date: 06/13/2014  DOB: [birth year]  Weekly Radiation Therapy Management  Current Dose: 10.8 Gy     Planned Dose:  60.4 Gy  DIAGNOSIS: Stage I invasive ductal carcinoma of the left breast (triple negative, pT1c, pN0, Mx)    Narrative . . . . . . . . The patient presents for routine under treatment assessment.                                   The patient is without complaint.                                 Set-up films were reviewed.                                 The chart was checked. Physical Findings. . .  height is 5\' 2"  (1.575 m) and weight is 154 lb 14.4 oz (70.262 kg). Her temperature is 97.5 F (36.4 C). Her blood pressure is 107/71 and her pulse is 80. . Weight essentially stable.  Mild erythema noted in the left breast. Impression . . . . . . . The patient is tolerating radiation. Plan . . . . . . . . . . . . Continue treatment as planned.  ________________________________   Blair Promise, PhD, MD

## 2014-06-14 ENCOUNTER — Ambulatory Visit
Admission: RE | Admit: 2014-06-14 | Discharge: 2014-06-14 | Disposition: A | Discharge status: Home / Self Care | Payer: BC Managed Care – PPO | Source: Ambulatory Visit | Attending: Radiation Oncology | Admitting: Radiation Oncology

## 2014-06-14 DIAGNOSIS — Z510 Encounter for antineoplastic radiation therapy: Secondary | ICD-10-CM | POA: Diagnosis not present

## 2014-06-15 ENCOUNTER — Ambulatory Visit
Admission: RE | Admit: 2014-06-15 | Discharge: 2014-06-15 | Disposition: A | Discharge status: Home / Self Care | Payer: BC Managed Care – PPO | Source: Ambulatory Visit | Attending: Radiation Oncology | Admitting: Radiation Oncology

## 2014-06-15 DIAGNOSIS — Z510 Encounter for antineoplastic radiation therapy: Secondary | ICD-10-CM | POA: Diagnosis not present

## 2014-06-16 ENCOUNTER — Ambulatory Visit
Admission: RE | Admit: 2014-06-16 | Discharge: 2014-06-16 | Disposition: A | Discharge status: Home / Self Care | Payer: BC Managed Care – PPO | Source: Ambulatory Visit | Attending: Radiation Oncology | Admitting: Radiation Oncology

## 2014-06-16 DIAGNOSIS — Z510 Encounter for antineoplastic radiation therapy: Secondary | ICD-10-CM | POA: Diagnosis not present

## 2014-06-19 ENCOUNTER — Ambulatory Visit
Admission: RE | Admit: 2014-06-19 | Discharge: 2014-06-19 | Disposition: A | Discharge status: Home / Self Care | Payer: BC Managed Care – PPO | Source: Ambulatory Visit | Attending: Radiation Oncology | Admitting: Radiation Oncology

## 2014-06-19 DIAGNOSIS — Z510 Encounter for antineoplastic radiation therapy: Secondary | ICD-10-CM | POA: Diagnosis not present

## 2014-06-20 ENCOUNTER — Ambulatory Visit
Admission: RE | Admit: 2014-06-20 | Discharge: 2014-06-20 | Disposition: A | Discharge status: Home / Self Care | Payer: BC Managed Care – PPO | Source: Ambulatory Visit | Attending: Radiation Oncology | Admitting: Radiation Oncology

## 2014-06-20 ENCOUNTER — Encounter: Payer: Self-pay | Admitting: Radiation Oncology

## 2014-06-20 VITALS — BP 120/74 | HR 83 | Temp 97.9°F | Resp 20 | Wt 155.0 lb

## 2014-06-20 DIAGNOSIS — C50412 Malignant neoplasm of upper-outer quadrant of left female breast: Secondary | ICD-10-CM

## 2014-06-20 DIAGNOSIS — Z510 Encounter for antineoplastic radiation therapy: Secondary | ICD-10-CM | POA: Diagnosis not present

## 2014-06-20 NOTE — Progress Notes (Signed)
weekly rad txs left breast 11 completed, very mild erythema, using radiaplex gel bid, breast feels heavy when removing bra, energy level fine, appetite well, staying hydrated no c/o pain,  12:06 PM

## 2014-06-20 NOTE — Progress Notes (Signed)
  Radiation Oncology         [phone] ________________________________  Name: Sheri Arellano MRN: [id number]  Date: 06/20/2014  DOB: [birth year]  Weekly Radiation Therapy Management  Breast cancer of upper-outer quadrant of left female breast   Primary site: Breast (Left)   Staging method: AJCC 7th Edition   Clinical: Stage IA (T1a, N0, cM0)    Summary: Stage IA (T1a, N0, cM0)   Clinical comments: Staged at breast conference 12/21/13.   Current Dose: 19.8 Gy     Planned Dose:  60.4 Gy  Narrative . . . . . . . . The patient presents for routine under treatment assessment.                                   The patient is without complaint. She has noticed some heaviness to her left breast late in the da,y mild discomfort but no pain or itching                                 Set-up films were reviewed.                                 The chart was checked. Physical Findings. . .  weight is 155 lb (70.308 kg). Her oral temperature is 97.9 F (36.6 C). Her blood pressure is 120/74 and her pulse is 83. Her respiration is 20. . The left breast area shows some mild swelling and  as well as mild erythema. Impression . . . . . . . The patient is tolerating radiation. Plan . . . . . . . . . . . . Continue treatment as planned.  ________________________________   Blair Promise, PhD, MD

## 2014-06-21 ENCOUNTER — Ambulatory Visit
Admission: RE | Admit: 2014-06-21 | Discharge: 2014-06-21 | Disposition: A | Discharge status: Home / Self Care | Payer: BC Managed Care – PPO | Source: Ambulatory Visit | Attending: Radiation Oncology | Admitting: Radiation Oncology

## 2014-06-21 DIAGNOSIS — Z510 Encounter for antineoplastic radiation therapy: Secondary | ICD-10-CM | POA: Diagnosis not present

## 2014-06-22 ENCOUNTER — Ambulatory Visit
Admission: RE | Admit: 2014-06-22 | Discharge: 2014-06-22 | Disposition: A | Discharge status: Home / Self Care | Payer: BC Managed Care – PPO | Source: Ambulatory Visit | Attending: Radiation Oncology | Admitting: Radiation Oncology

## 2014-06-22 DIAGNOSIS — Z510 Encounter for antineoplastic radiation therapy: Secondary | ICD-10-CM | POA: Diagnosis not present

## 2014-06-23 ENCOUNTER — Ambulatory Visit
Admission: RE | Admit: 2014-06-23 | Discharge: 2014-06-23 | Disposition: A | Discharge status: Home / Self Care | Payer: BC Managed Care – PPO | Source: Ambulatory Visit | Attending: Radiation Oncology | Admitting: Radiation Oncology

## 2014-06-23 ENCOUNTER — Telehealth: Payer: Self-pay | Admitting: *Deleted

## 2014-06-23 DIAGNOSIS — Z510 Encounter for antineoplastic radiation therapy: Secondary | ICD-10-CM | POA: Diagnosis not present

## 2014-06-23 NOTE — Telephone Encounter (Signed)
Pt called to state she has noted ongoing bilateral leg swelling.  " I would get this with my chemo but then it would go away but now I finished chemo about 5 weeks ago and my ankles have started swelling again "  Per discussion both ankles swell. Pt denies any focal pain or redness.  She states in am ankles are not swollen but " by the end of the day they can be big "  This RN discussed above a latent side effect from the taxetere and is resolved with time- but this RN asked pt to stop by either before or after radiation for RN assessment and discussion for symptom management.

## 2014-06-26 ENCOUNTER — Ambulatory Visit
Admission: RE | Admit: 2014-06-26 | Discharge: 2014-06-26 | Disposition: A | Discharge status: Home / Self Care | Payer: BC Managed Care – PPO | Source: Ambulatory Visit | Attending: Radiation Oncology | Admitting: Radiation Oncology

## 2014-06-26 DIAGNOSIS — Z510 Encounter for antineoplastic radiation therapy: Secondary | ICD-10-CM | POA: Diagnosis not present

## 2014-06-27 ENCOUNTER — Ambulatory Visit
Admission: RE | Admit: 2014-06-27 | Discharge: 2014-06-27 | Disposition: A | Discharge status: Home / Self Care | Payer: BC Managed Care – PPO | Source: Ambulatory Visit | Attending: Radiation Oncology | Admitting: Radiation Oncology

## 2014-06-27 ENCOUNTER — Encounter: Payer: Self-pay | Admitting: Radiation Oncology

## 2014-06-27 VITALS — BP 117/83 | HR 67 | Temp 97.5°F | Resp 20 | Wt 156.0 lb

## 2014-06-27 DIAGNOSIS — C50412 Malignant neoplasm of upper-outer quadrant of left female breast: Secondary | ICD-10-CM

## 2014-06-27 DIAGNOSIS — Z510 Encounter for antineoplastic radiation therapy: Secondary | ICD-10-CM | POA: Diagnosis not present

## 2014-06-27 MED ORDER — RADIAPLEXRX EX GEL
Freq: Once | CUTANEOUS | Status: AC
  Administered 2014-06-27: 12:00:00 via TOPICAL

## 2014-06-27 NOTE — Progress Notes (Signed)
Patient denies pain, fatigue, loss of appetite. She is applying Radiaplex to left breast treatment area for slight redness. She denies itching of skin in treatment area.

## 2014-06-27 NOTE — Progress Notes (Signed)
  Radiation Oncology         [phone] ________________________________  Name: Sheri Arellano MRN: [id number]  Date: 06/27/2014  DOB: 1951-06-24  Weekly Radiation Therapy Management    Current Dose: 28.8 Gy     Planned Dose:  60.4 Gy  Narrative . . . . . . . . The patient presents for routine under treatment assessment.                                   The patient is without complaint except for some mild soreness in the right axillary region, opposite from the treatment area.                                 Set-up films were reviewed.                                 The chart was checked. Physical Findings. . .  weight is 156 lb (70.761 kg). Her oral temperature is 97.5 F (36.4 C). Her blood pressure is 117/83 and her pulse is 67. Her respiration is 20. Marland Kitchen Erythema is noted in the left breast and some radiation dermatitis. No palpable mass or adenopathy is noted in the right axillary region. Impression . . . . . . . The patient is tolerating radiation. Plan . . . . . . . . . . . . Continue treatment as planned.  ________________________________   Blair Promise, PhD, MD

## 2014-06-28 ENCOUNTER — Telehealth: Payer: Self-pay | Admitting: Oncology

## 2014-06-28 ENCOUNTER — Ambulatory Visit
Admission: RE | Admit: 2014-06-28 | Discharge: 2014-06-28 | Disposition: A | Discharge status: Home / Self Care | Payer: BC Managed Care – PPO | Source: Ambulatory Visit | Attending: Radiation Oncology | Admitting: Radiation Oncology

## 2014-06-28 ENCOUNTER — Other Ambulatory Visit: Payer: Self-pay

## 2014-06-28 DIAGNOSIS — Z510 Encounter for antineoplastic radiation therapy: Secondary | ICD-10-CM | POA: Diagnosis not present

## 2014-06-28 NOTE — Telephone Encounter (Signed)
per Terri to sch pt flush appt-sch-pt aware of time &date

## 2014-06-29 ENCOUNTER — Ambulatory Visit (HOSPITAL_BASED_OUTPATIENT_CLINIC_OR_DEPARTMENT_OTHER): Payer: BC Managed Care – PPO

## 2014-06-29 ENCOUNTER — Ambulatory Visit
Admission: RE | Admit: 2014-06-29 | Discharge: 2014-06-29 | Disposition: A | Discharge status: Home / Self Care | Payer: BC Managed Care – PPO | Source: Ambulatory Visit | Attending: Radiation Oncology | Admitting: Radiation Oncology

## 2014-06-29 VITALS — BP 122/71 | HR 74 | Temp 96.9°F

## 2014-06-29 DIAGNOSIS — Z510 Encounter for antineoplastic radiation therapy: Secondary | ICD-10-CM | POA: Diagnosis not present

## 2014-06-29 DIAGNOSIS — C50419 Malignant neoplasm of upper-outer quadrant of unspecified female breast: Secondary | ICD-10-CM

## 2014-06-29 DIAGNOSIS — Z95828 Presence of other vascular implants and grafts: Secondary | ICD-10-CM

## 2014-06-29 DIAGNOSIS — Z452 Encounter for adjustment and management of vascular access device: Secondary | ICD-10-CM

## 2014-06-29 MED ORDER — SODIUM CHLORIDE 0.9 % IJ SOLN
10.0000 mL | INTRAMUSCULAR | Status: DC | PRN
  Administered 2014-06-29: 10 mL via INTRAVENOUS
  Filled 2014-06-29: qty 10

## 2014-06-29 MED ORDER — HEPARIN SOD (PORK) LOCK FLUSH 100 UNIT/ML IV SOLN
500.0000 [IU] | Freq: Once | INTRAVENOUS | Status: AC
  Administered 2014-06-29: 500 [IU] via INTRAVENOUS
  Filled 2014-06-29: qty 5

## 2014-06-29 MED ORDER — SODIUM CHLORIDE 0.9 % IJ SOLN
10.0000 mL | INTRAMUSCULAR | Status: DC | PRN
  Filled 2014-06-29: qty 10

## 2014-06-29 MED ORDER — HEPARIN SOD (PORK) LOCK FLUSH 100 UNIT/ML IV SOLN
500.0000 [IU] | Freq: Once | INTRAVENOUS | Status: DC
  Filled 2014-06-29: qty 5

## 2014-06-29 NOTE — Patient Instructions (Signed)

## 2014-06-30 ENCOUNTER — Ambulatory Visit
Admission: RE | Admit: 2014-06-30 | Discharge: 2014-06-30 | Disposition: A | Discharge status: Home / Self Care | Payer: BC Managed Care – PPO | Source: Ambulatory Visit | Attending: Radiation Oncology | Admitting: Radiation Oncology

## 2014-06-30 DIAGNOSIS — Z510 Encounter for antineoplastic radiation therapy: Secondary | ICD-10-CM | POA: Diagnosis not present

## 2014-07-03 ENCOUNTER — Ambulatory Visit
Admission: RE | Admit: 2014-07-03 | Discharge: 2014-07-03 | Disposition: A | Discharge status: Home / Self Care | Payer: BC Managed Care – PPO | Source: Ambulatory Visit | Attending: Radiation Oncology | Admitting: Radiation Oncology

## 2014-07-03 DIAGNOSIS — Z510 Encounter for antineoplastic radiation therapy: Secondary | ICD-10-CM | POA: Diagnosis not present

## 2014-07-04 ENCOUNTER — Ambulatory Visit
Admission: RE | Admit: 2014-07-04 | Discharge: 2014-07-04 | Disposition: A | Discharge status: Home / Self Care | Payer: BC Managed Care – PPO | Source: Ambulatory Visit | Attending: Radiation Oncology | Admitting: Radiation Oncology

## 2014-07-04 ENCOUNTER — Encounter: Payer: Self-pay | Admitting: Radiation Oncology

## 2014-07-04 VITALS — BP 120/84 | HR 72 | Temp 98.1°F | Ht 62.0 in | Wt 154.1 lb

## 2014-07-04 DIAGNOSIS — C50412 Malignant neoplasm of upper-outer quadrant of left female breast: Secondary | ICD-10-CM

## 2014-07-04 DIAGNOSIS — Z510 Encounter for antineoplastic radiation therapy: Secondary | ICD-10-CM | POA: Diagnosis not present

## 2014-07-04 NOTE — Progress Notes (Signed)
Sheri Arellano has completed 21 fractions to her left breast.  She denies pain.  The skin on her left breast is red.  She reports occasional itching.  She is using radiaplex gel.  She denies fatigue.  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

## 2014-07-04 NOTE — Progress Notes (Signed)
  Radiation Oncology         [phone] ________________________________  Name: Sheri Arellano MRN: [id number]  Date: 07/04/2014  DOB: 09-18-1951  Weekly Radiation Therapy Management  Breast cancer of upper-outer quadrant of left female breast   Primary site: Breast (Left)   Staging method: AJCC 7th Edition   Clinical: Stage IA (T1a, N0, cM0)    Summary: Stage IA (T1a, N0, cM0)   Clinical comments: Staged at breast conference 12/21/13.   Current Dose: 37.8 Gy     Planned Dose:  60.4 Gy  Narrative . . . . . . . . The patient presents for routine under treatment assessment.                                   The patient is without complaint except for occasional itching within the breast                                 Set-up films were reviewed.                                 The chart was checked. Physical Findings. . .  height is 5\' 2"  (1.575 m) and weight is 154 lb 1.6 oz (69.899 kg). Her oral temperature is 98.1 F (36.7 C). Her blood pressure is 120/84 and her pulse is 72. . The left breast area shows significant erythema and radiation dermatitis. No moist desquamation Impression . . . . . . . The patient is tolerating radiation. Plan . . . . . . . . . . . . Continue treatment as planned.  ________________________________   Blair Promise, PhD, MD

## 2014-07-05 ENCOUNTER — Ambulatory Visit
Admission: RE | Admit: 2014-07-05 | Discharge: 2014-07-05 | Disposition: A | Discharge status: Home / Self Care | Payer: BC Managed Care – PPO | Source: Ambulatory Visit | Attending: Radiation Oncology | Admitting: Radiation Oncology

## 2014-07-05 DIAGNOSIS — Z510 Encounter for antineoplastic radiation therapy: Secondary | ICD-10-CM | POA: Diagnosis not present

## 2014-07-06 ENCOUNTER — Ambulatory Visit
Admission: RE | Admit: 2014-07-06 | Discharge: 2014-07-06 | Disposition: A | Discharge status: Home / Self Care | Payer: BC Managed Care – PPO | Source: Ambulatory Visit | Attending: Radiation Oncology | Admitting: Radiation Oncology

## 2014-07-06 DIAGNOSIS — Z510 Encounter for antineoplastic radiation therapy: Secondary | ICD-10-CM | POA: Diagnosis not present

## 2014-07-07 ENCOUNTER — Ambulatory Visit
Admission: RE | Admit: 2014-07-07 | Discharge: 2014-07-07 | Disposition: A | Discharge status: Home / Self Care | Payer: BC Managed Care – PPO | Source: Ambulatory Visit | Attending: Radiation Oncology | Admitting: Radiation Oncology

## 2014-07-07 DIAGNOSIS — Z510 Encounter for antineoplastic radiation therapy: Secondary | ICD-10-CM | POA: Diagnosis not present

## 2014-07-10 ENCOUNTER — Ambulatory Visit
Admission: RE | Admit: 2014-07-10 | Discharge: 2014-07-10 | Disposition: A | Discharge status: Home / Self Care | Payer: BC Managed Care – PPO | Source: Ambulatory Visit | Attending: Radiation Oncology | Admitting: Radiation Oncology

## 2014-07-10 DIAGNOSIS — Z510 Encounter for antineoplastic radiation therapy: Secondary | ICD-10-CM | POA: Diagnosis not present

## 2014-07-11 ENCOUNTER — Encounter: Payer: Self-pay | Admitting: Radiation Oncology

## 2014-07-11 ENCOUNTER — Ambulatory Visit
Admission: RE | Admit: 2014-07-11 | Discharge: 2014-07-11 | Disposition: A | Discharge status: Home / Self Care | Payer: BC Managed Care – PPO | Source: Ambulatory Visit | Attending: Radiation Oncology | Admitting: Radiation Oncology

## 2014-07-11 ENCOUNTER — Ambulatory Visit: Payer: BC Managed Care – PPO | Admitting: Radiation Oncology

## 2014-07-11 VITALS — BP 115/80 | HR 70 | Resp 16 | Wt 154.6 lb

## 2014-07-11 DIAGNOSIS — Z510 Encounter for antineoplastic radiation therapy: Secondary | ICD-10-CM | POA: Diagnosis not present

## 2014-07-11 DIAGNOSIS — C50412 Malignant neoplasm of upper-outer quadrant of left female breast: Secondary | ICD-10-CM

## 2014-07-11 MED ORDER — RADIAPLEXRX EX GEL
Freq: Once | CUTANEOUS | Status: AC
  Administered 2014-07-11: 13:00:00 via TOPICAL

## 2014-07-11 NOTE — Progress Notes (Signed)
Weekly Management Note Current Dose: 46.8  Gy  Projected Dose: 50.4 Gy   Narrative:  The patient presents for routine under treatment assessment.  CBCT/MVCT images/Port film x-rays were reviewed.  The chart was checked. Working full time. Skin is red but not painful. Using adiaplex. BID next week to finish up for her vacation.   Physical Findings: Weight: 154 lb 9.6 oz (70.126 kg). Red left breast. No moist desquamation.   Impression:  The patient is tolerating radiation.  Plan:  Continue treatment as planned. Continue radiaplex.

## 2014-07-11 NOTE — Progress Notes (Signed)
Reports mild manageable fatigue. Weight and vitals stable. Reports that she continues to use radiaplex bid on left/treated breast. Hyperpigmentation of left/treated breast without desquamation noted. Reports skin of treatment area occasional itches. Reports she continues to work full time. Understands to use radiaplex bid for two weeks s/p completion of xrt.

## 2014-07-11 NOTE — Addendum Note (Signed)
Encounter addended by: Heywood Footman, RN on: 07/11/2014  1:10 PM<BR>     Documentation filed: Inpatient MAR, Orders

## 2014-07-12 ENCOUNTER — Ambulatory Visit
Admission: RE | Admit: 2014-07-12 | Discharge: 2014-07-12 | Disposition: A | Discharge status: Home / Self Care | Payer: BC Managed Care – PPO | Source: Ambulatory Visit | Attending: Radiation Oncology | Admitting: Radiation Oncology

## 2014-07-12 DIAGNOSIS — Z510 Encounter for antineoplastic radiation therapy: Secondary | ICD-10-CM | POA: Diagnosis not present

## 2014-07-13 ENCOUNTER — Ambulatory Visit
Admission: RE | Admit: 2014-07-13 | Discharge: 2014-07-13 | Disposition: A | Discharge status: Home / Self Care | Payer: BC Managed Care – PPO | Source: Ambulatory Visit | Attending: Radiation Oncology | Admitting: Radiation Oncology

## 2014-07-13 DIAGNOSIS — Z510 Encounter for antineoplastic radiation therapy: Secondary | ICD-10-CM | POA: Diagnosis not present

## 2014-07-14 ENCOUNTER — Encounter: Payer: Self-pay | Admitting: Radiation Oncology

## 2014-07-14 ENCOUNTER — Ambulatory Visit
Admission: RE | Admit: 2014-07-14 | Discharge: 2014-07-14 | Disposition: A | Discharge status: Home / Self Care | Payer: BC Managed Care – PPO | Source: Ambulatory Visit | Attending: Radiation Oncology | Admitting: Radiation Oncology

## 2014-07-14 DIAGNOSIS — Z510 Encounter for antineoplastic radiation therapy: Secondary | ICD-10-CM | POA: Diagnosis not present

## 2014-07-14 NOTE — Progress Notes (Signed)
Simulation Verification Note  The patient was brought to the treatment unit and placed in the planned treatment position. The clinical setup was verified. Then port films were obtained and uploaded to the radiation oncology medical record software.  The treatment beams were carefully compared against the planned radiation fields. The position location and shape of the radiation fields was reviewed. They targeted volume of tissue appears to be appropriately covered by the radiation beams. Organs at risk appear to be excluded as planned.  Based on my personal review, I approved the simulation verification. The patient's treatment will proceed as planned.  -----------------------------------  Jessyka Austria, MD  

## 2014-07-17 ENCOUNTER — Ambulatory Visit
Admission: RE | Admit: 2014-07-17 | Discharge: 2014-07-17 | Disposition: A | Discharge status: Home / Self Care | Payer: BC Managed Care – PPO | Source: Ambulatory Visit | Attending: Radiation Oncology | Admitting: Radiation Oncology

## 2014-07-17 DIAGNOSIS — Z510 Encounter for antineoplastic radiation therapy: Secondary | ICD-10-CM | POA: Diagnosis not present

## 2014-07-18 ENCOUNTER — Ambulatory Visit
Admission: RE | Admit: 2014-07-18 | Discharge: 2014-07-18 | Disposition: A | Discharge status: Home / Self Care | Payer: BC Managed Care – PPO | Source: Ambulatory Visit | Attending: Radiation Oncology | Admitting: Radiation Oncology

## 2014-07-18 ENCOUNTER — Ambulatory Visit: Payer: BC Managed Care – PPO

## 2014-07-18 ENCOUNTER — Encounter: Payer: Self-pay | Admitting: Radiation Oncology

## 2014-07-18 VITALS — BP 119/81 | HR 75 | Resp 16 | Wt 155.9 lb

## 2014-07-18 DIAGNOSIS — C50412 Malignant neoplasm of upper-outer quadrant of left female breast: Secondary | ICD-10-CM

## 2014-07-18 DIAGNOSIS — Z510 Encounter for antineoplastic radiation therapy: Secondary | ICD-10-CM | POA: Diagnosis not present

## 2014-07-18 NOTE — Progress Notes (Signed)
Reports mild manageable fatigue. Weight and vitals stable. Reports that she continues to use radiaplex bid on left/treated breast. Hyperpigmentation of left/treated breast without desquamation noted. Reports skin of treatment area occasional itches. Reports she continues to work full time. Understands to use radiaplex bid for two weeks s/p completion of xrt. Provided one month follow up appointment card. Discussed ABC and FYNN but, patient not interested.

## 2014-07-18 NOTE — Progress Notes (Signed)
  Radiation Oncology         [phone] ________________________________  Name: Sheri Arellano MRN: [id number]  Date: 07/18/2014  DOB: Dec 23, 1950  Weekly Radiation Therapy Management  Breast cancer of upper-outer quadrant of left female breast   Primary site: Breast (Left)   Staging method: AJCC 7th Edition   Clinical: Stage IA (T1a, N0, cM0)   Summary: Stage IA (T1a, N0, cM0)   Clinical comments: Staged at breast conference 12/21/13.   Current Dose: 60.4 Gy     Planned Dose:  60.4 Gy  Narrative . . . . . . . . The patient presents for routine under treatment assessment.                                   The patient is without complaint. She is happy to complete her radiation therapy today. She reports some mild fatigue but continues her usual schedule. minimal discomfort/itching within the left breast area.                                 Set-up films were reviewed.                                 The chart was checked. Physical Findings. . .  weight is 155 lb 14.4 oz (70.716 kg). Her blood pressure is 119/81 and her pulse is 75. Her respiration is 16. . Weight essentially stable.  Erythema is noted in the left breast without any moist desquamation Impression . . . . . . . The patient is tolerating radiation. Plan . . . . . . . . . . . Marland Kitchen routine followup in one month  ________________________________   Blair Promise, PhD, MD

## 2014-07-19 ENCOUNTER — Ambulatory Visit: Payer: BC Managed Care – PPO

## 2014-07-20 ENCOUNTER — Ambulatory Visit: Payer: BC Managed Care – PPO

## 2014-07-26 DIAGNOSIS — N952 Postmenopausal atrophic vaginitis: Secondary | ICD-10-CM | POA: Insufficient documentation

## 2014-07-26 DIAGNOSIS — K649 Unspecified hemorrhoids: Secondary | ICD-10-CM | POA: Insufficient documentation

## 2014-07-28 ENCOUNTER — Encounter: Payer: Self-pay | Admitting: Radiation Oncology

## 2014-07-28 ENCOUNTER — Other Ambulatory Visit: Payer: Self-pay | Admitting: Gastroenterology

## 2014-07-28 NOTE — Progress Notes (Signed)
   Department of Radiation Oncology  Phone:  [phone] Fax:        [phone]  Simulation note  On July 29 the patient underwent additional planning for radiation therapy directed at the lumpectomy cavity within the left breast. The patient's treatment planning CT scan was reviewed and she had set up of a 3 field photon boost given the depth within the breast area of the lumpectomy cavity. Computerized isodose plan will be generated for treatment. The patient will receive 5 additional treatments for a boost dose of 10 gray.  -----------------------------------  Blair Promise, PhD, MD

## 2014-07-28 NOTE — Progress Notes (Signed)
  Radiation Oncology         [phone] ________________________________  Name: Sheri Arellano MRN: [id number]  Date: 07/28/2014  DOB: 1950/12/12  End of Treatment Note  Diagnosis:   Stage I invasive ductal carcinoma of the left breast (triple negative, pT1c, pN0, Mx)    Indication for treatment:  Breast conservation therapy       Radiation treatment dates:   June 29 through August 11  Site/dose:   Left breast 50.4 gray in 28 fractions, the lumpectomy cavity was boosted to 60.4 gray  Beams/energy:   3-D conformal using breath field technique, tangential beams,  lumpectomy cavity boost was with a 3 field photon arrangement  Narrative: The patient tolerated radiation treatment relatively well.   The patient experienced mild fatigue and mild discomfort in the breast. She continues to work her usual schedule throughout her course of treatment.  Plan: The patient has completed radiation treatment. The patient will return to radiation oncology clinic for routine followup in one month. I advised them to call or return sooner if they have any questions or concerns related to their recovery or treatment.  -----------------------------------  Blair Promise, PhD, MD

## 2014-08-02 ENCOUNTER — Other Ambulatory Visit (INDEPENDENT_AMBULATORY_CARE_PROVIDER_SITE_OTHER): Payer: Self-pay | Admitting: General Surgery

## 2014-08-02 ENCOUNTER — Other Ambulatory Visit (HOSPITAL_BASED_OUTPATIENT_CLINIC_OR_DEPARTMENT_OTHER): Payer: BC Managed Care – PPO

## 2014-08-02 ENCOUNTER — Ambulatory Visit (HOSPITAL_BASED_OUTPATIENT_CLINIC_OR_DEPARTMENT_OTHER): Payer: BC Managed Care – PPO | Admitting: Oncology

## 2014-08-02 VITALS — BP 113/82 | HR 74 | Temp 97.8°F | Resp 18 | Ht 62.0 in | Wt 156.0 lb

## 2014-08-02 DIAGNOSIS — Z171 Estrogen receptor negative status [ER-]: Secondary | ICD-10-CM

## 2014-08-02 DIAGNOSIS — C50419 Malignant neoplasm of upper-outer quadrant of unspecified female breast: Secondary | ICD-10-CM

## 2014-08-02 DIAGNOSIS — C50412 Malignant neoplasm of upper-outer quadrant of left female breast: Secondary | ICD-10-CM

## 2014-08-02 DIAGNOSIS — IMO0002 Reserved for concepts with insufficient information to code with codable children: Secondary | ICD-10-CM

## 2014-08-02 LAB — COMPREHENSIVE METABOLIC PANEL (CC13)
ALT: 16 U/L (ref 0–55)
AST: 15 U/L (ref 5–34)
Albumin: 3.8 g/dL (ref 3.5–5.0)
Alkaline Phosphatase: 54 U/L (ref 40–150)
Anion Gap: 8 mEq/L (ref 3–11)
BUN: 9.3 mg/dL (ref 7.0–26.0)
CO2: 28 mEq/L (ref 22–29)
Calcium: 9.4 mg/dL (ref 8.4–10.4)
Chloride: 101 mEq/L (ref 98–109)
Creatinine: 0.7 mg/dL (ref 0.6–1.1)
Glucose: 103 mg/dl (ref 70–140)
Potassium: 3.5 mEq/L (ref 3.5–5.1)
Sodium: 137 mEq/L (ref 136–145)
Total Bilirubin: 0.39 mg/dL (ref 0.20–1.20)
Total Protein: 6.5 g/dL (ref 6.4–8.3)

## 2014-08-02 LAB — CBC WITH DIFFERENTIAL/PLATELET
BASO%: 0.8 % (ref 0.0–2.0)
Basophils Absolute: 0 10*3/uL (ref 0.0–0.1)
EOS%: 1.8 % (ref 0.0–7.0)
Eosinophils Absolute: 0.1 10*3/uL (ref 0.0–0.5)
HCT: 41.6 % (ref 34.8–46.6)
HGB: 14.2 g/dL (ref 11.6–15.9)
LYMPH%: 23.7 % (ref 14.0–49.7)
MCH: 31.5 pg (ref 25.1–34.0)
MCHC: 34.2 g/dL (ref 31.5–36.0)
MCV: 92.2 fL (ref 79.5–101.0)
MONO#: 0.5 10*3/uL (ref 0.1–0.9)
MONO%: 11.1 % (ref 0.0–14.0)
NEUT#: 2.6 10*3/uL (ref 1.5–6.5)
NEUT%: 62.6 % (ref 38.4–76.8)
Platelets: 236 10*3/uL (ref 145–400)
RBC: 4.52 10*6/uL (ref 3.70–5.45)
RDW: 13.7 % (ref 11.2–14.5)
WBC: 4.1 10*3/uL (ref 3.9–10.3)
lymph#: 1 10*3/uL (ref 0.9–3.3)

## 2014-08-02 NOTE — Progress Notes (Signed)
[facility]  [phone] [phone]     ID: [name] OB: 06-30-1951  MR#: [id number]  LOV#:[id number]  PCP: [name], MD GYN:  Olena Mater SU: Fanny Skates OTHER MD: Gery Pray, Crista Luria  CHIEF COMPLAINT: Early-stage breast cancer CURRENT THERAPY: Observation  BREAST CANCER HISTORY:: Sheri Arellano had routine screening mammography at [facility] 11/21/2013 showing an irregular density in the left breast at the 1:00 position. Ultrasound was obtained 11/29/2013 and showed a 4 mm tolerate them live irregular mass in the left breast at the 2:00 position. This was hypoechoic. Biopsy of this area 12/12/2013 showed (SAA [id number]) an invasive ductal carcinoma, grade 1 or 2, estrogen receptor 0%, progesterone receptor 0%, with no HER-2 amplification, the signals ratio being 1.09 and the number per cell be 1.95. The MIB-1-1 was 72%.  The patient subsequent history is as detailed below  INTERVAL HISTORY: Sheri Arellano returns today for followup of her breast cancer. Since her last visit here she completed her radiation treatments. 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 She did have some erythema and desquamation after the last week of treatment. That is clear.  REVIEW OF SYSTEMS: Sheri Arellano  walks for exercise, perhaps a mile a day. She is thinking of getting a good bit to motivate herself more. She has had no unusual headaches, visual changes, cough, phlegm production, or pleurisy. She feels she needs to lose a little weight. She is due for colonoscopy and that is already set up through Dr. Wynetta Emery for later this year. A detailed review of systems today was otherwise noncontributory  PAST MEDICAL HISTORY: Past Medical History  Diagnosis Date  . Esophageal dysmotility   . Anxiety     probable panic attacks  . Insomnia   . GERD  (gastroesophageal reflux disease)   . Hyperlipidemia   . Dysphagia   . Vitamin D deficiency   . Other malaise and fatigue   . SVT (supraventricular tachycardia)     documented-takes metoprolol as needed  . H/O hiatal hernia   . Cancer     breast  . Iron deficiency anemia     hx    PAST SURGICAL HISTORY: Past Surgical History  Procedure Laterality Date  . Carpal tunnel release      lt and rt  . Cholecystectomy  1999  . Partial mastectomy with needle localization and axillary sentinel lymph node bx Left 01/16/2014    Procedure: PARTIAL MASTECTOMY WITH NEEDLE LOCALIZATION AND AXILLARY SENTINEL LYMPH NODE BIOPSY;  Surgeon: [name], MD;  Location: [facility];  Service: General;  Laterality: Left;  . Portacath placement Right 02/28/2014    Procedure: INSERTION PORT-A-CATH;  Surgeon: [name], MD;  Location: [facility];  Service: General;  Laterality: Right;    FAMILY HISTORY Family History  Problem Relation Age of Onset  . Atrial fibrillation Mother   . Hyperlipidemia Mother   . Hypertension Mother   . Cancer Father     bladder  . Cancer Maternal Aunt     breast   the patient's parents are living, both in their 41s. The patient had one brother and one no sisters. One of the patient's mothers 3 sisters was diagnosed with breast cancer before the age of 75. There is no history of ovarian cancer in the family.  GYNECOLOGIC HISTORY:  (Reviewed 05/02/2014) Menarche age 40, first  live birth age 70, the patient is [name]. She underwent menopause at age 10, and has been on hormone replacement for the last 23 years (estradiol and norethindrone most recently). 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 [name] used to work for [employer] but is currently retired. Their Sheri date lives in [place] where he owns an Electronics engineer and [employer] [place] products. Sheri Arellano lives in [place], currently  working for [employer]. The patient has 3 grandchildren. She is a Furniture conservator/restorer.    ADVANCED DIRECTIVES: In place   HEALTH MAINTENANCE: (Reviewed 05/02/2014) History  Substance Use Topics  . Smoking status: Former Smoker -- 1.50 packs/day for 8 years    Types: Cigarettes    Quit date: 01/22/1976  . Smokeless tobacco: Former Systems developer    Quit date: 02/16/1975  . Alcohol Use: 4.8 oz/week    1 Glasses of wine, 7 Shots of liquor per week     Comment: rarely     Colonoscopy: Not on file  PAP: March 2014, Dr. Inda Merlin  Bone density: Not on file  Lipid panel: Not on file   Allergies  Allergen Reactions  . Erythromycin Base Other (See Comments)    Stomach ache    Current Outpatient Prescriptions  Medication Sig Dispense Refill  . aspirin EC 81 MG tablet Take 81 mg by mouth daily.      . Cholecalciferol (VITAMIN D) 2000 UNITS tablet Take 2,000 Units by mouth daily.      . clonazePAM (KLONOPIN) 1 MG tablet Take 1 tablet (1 mg total) by mouth at bedtime as needed (sleep).  60 tablet  0  . dexamethasone (DECADRON) 4 MG tablet TAKE TWO TABLETS BY MOUTH TWICE DAILY. START THE DAY BEFORE TAXOTERE. THEN AGAIN THE DAY AFTER CHEMO FOR 3 DAYS   30 tablet  0  . docusate sodium (COLACE) 100 MG capsule Take 100 mg by mouth 2 (two) times daily.      Marland Kitchen doxycycline (MONODOX) 100 MG capsule Take 100 mg by mouth at bedtime.       . hyaluronate sodium (RADIAPLEXRX) GEL Apply 1 application topically 2 (two) times daily. Apply after rad txs and bedtime and on weekends to affected breast area      . lidocaine-prilocaine (EMLA) cream Apply 1 application topically as needed.  30 g  0  . loratadine (CLARITIN) 10 MG tablet Take 10 mg by mouth daily.      . metoprolol tartrate (LOPRESSOR) 25 MG tablet Take 25 mg by mouth as needed (tachycardia).       . nitroGLYCERIN (NITROSTAT) 0.4 MG SL tablet Place 0.4 mg under the tongue every 5 (five) minutes as needed (Esophageal spasms).       . non-metallic deodorant  Jethro Poling) MISC Apply 1 application topically daily. Apply after rad txs daily and prn      . pantoprazole (PROTONIX) 40 MG tablet Take 40 mg by mouth daily.      . sertraline (ZOLOFT) 100 MG tablet Take 50 mg by mouth daily.       . simvastatin (ZOCOR) 40 MG tablet Take 20 mg by mouth at bedtime.        No current facility-administered medications for this visit.    OBJECTIVE: Middle-aged white woman in no acute distress Filed Vitals:   08/02/14 1023  BP: 113/82  Pulse: 74  Temp: 97.8 F (36.6 C)  Resp: 18  Body mass index is 28.53 kg/(m^2).    ECOG FS: 1 Filed Weights   08/02/14 1023  Weight: 156 lb (70.761 kg)   Sclerae unicteric, pupils are round and equal Oropharynx clear and moist No cervical or supraclavicular adenopathy Lungs no rales or rhonchi Heart regular rate and rhythm Abd soft, nontender, positive bowel sounds MSK no focal spinal tenderness, no upper extremity lymphedema Neuro: nonfocal, well oriented, appropriate affect Breasts: The right breast is unremarkable. Left breast status post lumpectomy and radiation. There is still some erythema but the desquamation is healing nicely. The left axilla is benign.   LAB RESULTS:  Lab Results  Component Value Date   WBC 4.1 08/02/2014   NEUTROABS 2.6 08/02/2014   HGB 14.2 08/02/2014   HCT 41.6 08/02/2014   MCV 92.2 08/02/2014   PLT 236 08/02/2014      Chemistry      Component Value Date/Time   NA 136 05/15/2014 1413   NA 140 02/27/2014 1420   K 3.7 05/15/2014 1413   K 3.4* 02/27/2014 1420   CL 100 02/27/2014 1420   CO2 23 05/15/2014 1413   CO2 27 02/27/2014 1420   BUN 12.0 05/15/2014 1413   BUN 12 02/27/2014 1420   CREATININE 0.7 05/15/2014 1413   CREATININE 0.78 02/27/2014 1420      Component Value Date/Time   CALCIUM 9.0 05/15/2014 1413   CALCIUM 9.3 02/27/2014 1420   ALKPHOS 51 05/15/2014 1413   AST 11 05/15/2014 1413   ALT 12 05/15/2014 1413   BILITOT 0.46 05/15/2014 1413       STUDIES: No results found.  ASSESSMENT:  63 y.o. [place] woman status post left breast biopsy 12/12/2013 for a clinical T1a N0 invasive ductal carcinoma, low to intermediate grade, triple negative, with an MIB-1 of 72%  (1) status post left lumpectomy and sentinel lymph node sampling 01/16/2014 for a pT1c pN0, stage IA invasive ductal carcinoma, grade 3, repeat prognostic panel again triple negative.  (2)  adjuvant chemotherapy consisting of 4 cycles of docetaxel/cyclophosphamide given on a Q. three-week basis completed 05/15/2014  (3) adjuvant radiation completed 07/18/2014   PLAN: Sheri Arellano has completed her local and systemic therapy for very small breast cancer. She understands that women who have T1a tumors generally do very well with local treatment alone. In her case she did systemic therapy as well, so her prognosis is excellent.  At this point we are starting followup. She can have her port removed. She will see her gynecologist in 3 months. She will see me again in 6 months. If she sees her primary care physician, Dr. Inda Merlin, MA, she will be optimally spacing out her physician's.  She has significant dyspareunia. I gave her information on her "pelvic health" program and also alerted her to the lecture here next week regarding sexuality after cancer. She is interested in using vaginal estrogen preparations. Unfortunately we do not have enough data to say that that is safe period in some cases where there are overriding quality-of-life issues of course patients go ahead and take a chance. She will be discussing that with her gynecologist. I also suggested she participated in the "finding your new normal" group.   Sheri Arellano has a good understanding of the overall plan. She agrees with it. She knows the goal of treatment in her cases cure. She will call with any problems that may develop before the next visit here.  Chauncey Cruel, MD   08/02/2014 10:24 AM

## 2014-08-08 ENCOUNTER — Telehealth: Payer: Self-pay | Admitting: *Deleted

## 2014-08-08 NOTE — Telephone Encounter (Signed)
PRESCRIPTION [facility]ED [facility] FAX #[phone]/PHONE #[phone]

## 2014-08-11 ENCOUNTER — Telehealth (INDEPENDENT_AMBULATORY_CARE_PROVIDER_SITE_OTHER): Payer: Self-pay

## 2014-08-11 NOTE — Telephone Encounter (Signed)
Patient states she is on Vacation and she will call when she returns about PAC removal , she thinks she is already scheduled to have PAC removed @ [facility] . I did not see where that has been scheduled . Patient will call when she returns

## 2014-08-14 NOTE — Telephone Encounter (Signed)
On schedule Sept.18.  hmi

## 2014-08-14 NOTE — H&P (Signed)
  This patient underwent left partial mastectomy and sentinel node biopsy on 01/16/2014. Preoperative imaging studies and biopsy suggested that she had a 4 mm triple negative breast cancer. I did a transverse elliptical incision because the tumor was so close the skin.  I got widely negative margins. This is a triple negative for breast cancer, invasive ductal, sentinel nose negative, Ki 67 72%. Pathology report revealed that this was a larger tumor, 1.2 cm, stage T1c, N0.  She's having no problems with wound healing and feels fine.  Power port was inserted 02/28/2014 She has completed chemotherapy and is referred back to me by Dr. Jana Hakim for port removal. She has also completed adjuvant radiation therapy.  Exam:  Patient looks very good. In no distress.  Left breast reveals radially oriented incision at the 3:00 position. Healing normally. Left axilla incision healing normally. No hematoma. No infection. Almost no thickening of the tissues. Cosmesis is very good.   Assessment:  Invasive ductal carcinoma left breast, 3:00 position, TNBC, 1.2 cm diameter, stage T1c, , N0.  Recovering uneventfully following left partial mastectomy and sentinel node biopsy, chemo and XRT.  Plan:  .  Schedule for removal of port. See me in  Feb., 2016 after mammograms.    Edsel Petrin. Dalbert Batman, M.D., [facility]  General and Minimally invasive Surgery  Breast and Colorectal Surgery  Office: [phone]  Pager: [phone]

## 2014-08-22 NOTE — Progress Notes (Signed)
Pt states this is LOCAL-and planning no driver

## 2014-08-23 ENCOUNTER — Encounter (HOSPITAL_BASED_OUTPATIENT_CLINIC_OR_DEPARTMENT_OTHER): Payer: Self-pay | Admitting: Certified Registered"

## 2014-08-23 ENCOUNTER — Encounter (HOSPITAL_BASED_OUTPATIENT_CLINIC_OR_DEPARTMENT_OTHER)
Admission: RE | Disposition: A | Discharge status: Home / Self Care | Payer: Self-pay | Source: Ambulatory Visit | Attending: General Surgery

## 2014-08-23 ENCOUNTER — Ambulatory Visit (HOSPITAL_BASED_OUTPATIENT_CLINIC_OR_DEPARTMENT_OTHER)
Admission: RE | Admit: 2014-08-23 | Discharge: 2014-08-23 | Disposition: A | Discharge status: Home / Self Care | Payer: BC Managed Care – PPO | Source: Ambulatory Visit | Attending: General Surgery | Admitting: General Surgery

## 2014-08-23 DIAGNOSIS — C50419 Malignant neoplasm of upper-outer quadrant of unspecified female breast: Secondary | ICD-10-CM | POA: Insufficient documentation

## 2014-08-23 DIAGNOSIS — Z9221 Personal history of antineoplastic chemotherapy: Secondary | ICD-10-CM | POA: Insufficient documentation

## 2014-08-23 DIAGNOSIS — Z171 Estrogen receptor negative status [ER-]: Secondary | ICD-10-CM | POA: Diagnosis present

## 2014-08-23 DIAGNOSIS — C50412 Malignant neoplasm of upper-outer quadrant of left female breast: Secondary | ICD-10-CM | POA: Diagnosis present

## 2014-08-23 HISTORY — PX: PORT-A-CATH REMOVAL: SHX5289

## 2014-08-23 SURGERY — MINOR REMOVAL PORT-A-CATH
Anesthesia: LOCAL | Site: Chest | Laterality: Right

## 2014-08-23 SURGERY — MINOR REMOVAL PORT-A-CATH
Anesthesia: LOCAL

## 2014-08-23 MED ORDER — FENTANYL CITRATE 0.05 MG/ML IJ SOLN
INTRAMUSCULAR | Status: AC
  Filled 2014-08-23: qty 4

## 2014-08-23 MED ORDER — LIDOCAINE-EPINEPHRINE 1 %-1:100000 IJ SOLN
INTRAMUSCULAR | Status: AC
  Filled 2014-08-23: qty 1

## 2014-08-23 MED ORDER — HYDROCODONE-ACETAMINOPHEN 5-325 MG PO TABS: 1.0000 | ORAL_TABLET | Freq: Four times a day (QID) | ORAL | Status: DC | PRN

## 2014-08-23 MED ORDER — LIDOCAINE-EPINEPHRINE 1 %-1:100000 IJ SOLN
INTRAMUSCULAR | Status: DC | PRN
  Administered 2014-08-23: 15 mL

## 2014-08-23 MED ORDER — SODIUM BICARBONATE 4 % IV SOLN
INTRAVENOUS | Status: AC
  Filled 2014-08-23: qty 5

## 2014-08-23 MED ORDER — SODIUM BICARBONATE 4 % IV SOLN
INTRAVENOUS | Status: DC | PRN
  Administered 2014-08-23: 5 mL via INTRAVENOUS

## 2014-08-23 MED ORDER — CHLORHEXIDINE GLUCONATE 4 % EX LIQD: 1.0000 "application " | Freq: Once | CUTANEOUS | Status: DC

## 2014-08-23 MED ORDER — PROPOFOL 10 MG/ML IV BOLUS
INTRAVENOUS | Status: AC
  Filled 2014-08-23: qty 20

## 2014-08-23 MED ORDER — MIDAZOLAM HCL 2 MG/2ML IJ SOLN
INTRAMUSCULAR | Status: AC
  Filled 2014-08-23: qty 4

## 2014-08-23 SURGICAL SUPPLY — 41 items
ADH SKN CLS APL DERMABOND .7 (GAUZE/BANDAGES/DRESSINGS)
APL SKNCLS STERI-STRIP NONHPOA (GAUZE/BANDAGES/DRESSINGS)
BENZOIN TINCTURE PRP APPL 2/3 (GAUZE/BANDAGES/DRESSINGS) IMPLANT
BLADE HEX COATED 2.75 (ELECTRODE) ×1 IMPLANT
BLADE SURG 15 STRL LF DISP TIS (BLADE) ×4 IMPLANT
BLADE SURG 15 STRL SS (BLADE) ×6
CANISTER SUCT 1200ML W/VALVE (MISCELLANEOUS) IMPLANT
CHLORAPREP W/TINT 26ML (MISCELLANEOUS) ×3 IMPLANT
COVER MAYO STAND STRL (DRAPES) ×3 IMPLANT
COVER TABLE BACK 60X90 (DRAPES) ×3 IMPLANT
DECANTER SPIKE VIAL GLASS SM (MISCELLANEOUS) ×3 IMPLANT
DERMABOND ADVANCED (GAUZE/BANDAGES/DRESSINGS)
DERMABOND ADVANCED .7 DNX12 (GAUZE/BANDAGES/DRESSINGS) IMPLANT
DRAPE PED LAPAROTOMY (DRAPES) ×1 IMPLANT
DRAPE UTILITY XL STRL (DRAPES) ×3 IMPLANT
DRSG TEGADERM 4X4.75 (GAUZE/BANDAGES/DRESSINGS) IMPLANT
ELECT REM PT RETURN 9FT ADLT (ELECTROSURGICAL)
ELECTRODE REM PT RTRN 9FT ADLT (ELECTROSURGICAL) ×1 IMPLANT
GLOVE BIO SURGEON STRL SZ7 (GLOVE) ×2 IMPLANT
GLOVE BIOGEL M STRL SZ7.5 (GLOVE) ×2 IMPLANT
GLOVE BIOGEL PI IND STRL 8 (GLOVE) ×1 IMPLANT
GLOVE BIOGEL PI INDICATOR 8 (GLOVE) ×1
GLOVE EUDERMIC 7 POWDERFREE (GLOVE) ×3 IMPLANT
GOWN STRL REUS W/ TWL LRG LVL3 (GOWN DISPOSABLE) ×1 IMPLANT
GOWN STRL REUS W/ TWL XL LVL3 (GOWN DISPOSABLE) ×4 IMPLANT
GOWN STRL REUS W/TWL LRG LVL3 (GOWN DISPOSABLE)
GOWN STRL REUS W/TWL XL LVL3 (GOWN DISPOSABLE) ×9
NDL HYPO 25X1 1.5 SAFETY (NEEDLE) ×1 IMPLANT
NEEDLE HYPO 25X1 1.5 SAFETY (NEEDLE) ×3 IMPLANT
PACK BASIN DAY SURGERY FS (CUSTOM PROCEDURE TRAY) ×3 IMPLANT
PENCIL BUTTON HOLSTER BLD 10FT (ELECTRODE) ×3 IMPLANT
SLEEVE SCD COMPRESS KNEE MED (MISCELLANEOUS) IMPLANT
SPONGE GAUZE 4X4 12PLY STER LF (GAUZE/BANDAGES/DRESSINGS) IMPLANT
STRIP CLOSURE SKIN 1/2X4 (GAUZE/BANDAGES/DRESSINGS) IMPLANT
SUT MNCRL AB 4-0 PS2 18 (SUTURE) ×3 IMPLANT
SUT VICRYL 3-0 CR8 SH (SUTURE) ×3 IMPLANT
SYRINGE 10CC LL (SYRINGE) ×3 IMPLANT
TOWEL OR 17X24 6PK STRL BLUE (TOWEL DISPOSABLE) ×6 IMPLANT
TOWEL OR NON WOVEN STRL DISP B (DISPOSABLE) ×3 IMPLANT
TUBE CONNECTING 20X1/4 (TUBING) IMPLANT
YANKAUER SUCT BULB TIP NO VENT (SUCTIONS) IMPLANT

## 2014-08-23 NOTE — Interval H&P Note (Signed)
History and Physical Interval Note:  08/23/2014 10:32 AM  Sheri Arellano  has presented today for surgery, with the diagnosis of Breast Cancer  The goals and the various methods of treatment have been discussed with the patient and family. After consideration of risks, benefits and other options for treatment, the patient has consented to  Procedure(s): REMOVAL PORT-A-CATH (N/A) as a surgical intervention .  The patient's history has been reviewed, patient examined today , no change in status, stable for surgery.  I have reviewed the patient's chart and labs.  Questions were answered to the patient's satisfaction.     [name]

## 2014-08-23 NOTE — Discharge Instructions (Signed)
Keep the wound clean and dry for 24 hours. Keep an ice pack on the wound intermittently until you go to bed tonight. You may shower, starting tomorrow. No tub baths for 2 weeks. The Dermabond Super Glue should wear off in about 3 weeks Please call Dr. Darrel Hoover office if there are any problems.  Return to see Dr. Dalbert Batman in January or February for a 1 year breast cancer followup, after you get your annual mammograms.

## 2014-08-23 NOTE — Op Note (Signed)
Patient Name:           Sheri Arellano   Date of Surgery:        08/23/2014  Pre op Diagnosis:      Cancer left breast, upper outer quadrant  Post op Diagnosis:    Same  Procedure:                 Removal of Port-A-Cath  Surgeon:                     Edsel Petrin. Dalbert Batman, M.D., FACS  Assistant:                      None  Operative Indications:    This patient underwent left partial mastectomy and sentinel node biopsy on 01/16/2014. Preoperative imaging studies and biopsy suggested that she had a 4 mm triple negative breast cancer. I did a transverse elliptical incision because the tumor was so close the skin.  I got widely negative margins. This is a triple negative for breast cancer, invasive ductal, sentinel nose negative, Ki 67 72%. Pathology report revealed that this was a larger tumor, 1.2 cm, stage T1c, N0.  She's having no problems with wound healing and feels fine.  Power port was inserted 02/28/2014  She has completed chemotherapy and was referred back to me by Dr. Jana Hakim for port removal. She has also completed adjuvant radiation therapy    Operative Findings:       The port and catheter were removed intact. There was no sign of infection. There was no bleeding.  Procedure in Detail:          The patient was brought to the operating room at [facility] and placed supine on the operating table. The procedure was done under local anesthesia at the patient's request. The right infraclavicular area was prepped and draped in a sterile fashion. Surgical time out was performed. 1% Xylocaine with epinephrine was used as local infiltration anesthetic. A transverse incision was made overlying the port in the right infraclavicular area, through the old scar. Dissection was carried down to the capsule around the port and the capsule was incised, the port elevated, the sutures removed, and the port and catheter removed intact. Subcutaneous tissue was closed with interrupted 3-0 Vicryl sutures and  the skin closed with a running subcuticular 4-0 Monocryl suture and Dermabond. She tolerated the procedure well was taken to the short stay area in good condition. EBL 5 cc. Counts correct. Complications none.     Edsel Petrin. Dalbert Batman, M.D., FACS General and Minimally Invasive Surgery Breast and Colorectal Surgery  08/23/2014 11:15 AM

## 2014-08-24 ENCOUNTER — Encounter (HOSPITAL_BASED_OUTPATIENT_CLINIC_OR_DEPARTMENT_OTHER): Payer: Self-pay | Admitting: General Surgery

## 2014-08-28 ENCOUNTER — Encounter: Payer: Self-pay | Admitting: Oncology

## 2014-08-29 ENCOUNTER — Encounter (INDEPENDENT_AMBULATORY_CARE_PROVIDER_SITE_OTHER): Payer: BC Managed Care – PPO | Admitting: General Surgery

## 2014-08-29 ENCOUNTER — Ambulatory Visit (INDEPENDENT_AMBULATORY_CARE_PROVIDER_SITE_OTHER): Payer: BC Managed Care – PPO | Admitting: General Surgery

## 2014-08-31 ENCOUNTER — Ambulatory Visit
Admission: RE | Admit: 2014-08-31 | Discharge: 2014-08-31 | Disposition: A | Discharge status: Home / Self Care | Payer: BC Managed Care – PPO | Source: Ambulatory Visit | Attending: Radiation Oncology | Admitting: Radiation Oncology

## 2014-08-31 ENCOUNTER — Encounter: Payer: Self-pay | Admitting: Radiation Oncology

## 2014-08-31 VITALS — BP 98/80 | HR 67 | Temp 97.6°F | Resp 16 | Wt 158.3 lb

## 2014-08-31 DIAGNOSIS — C50412 Malignant neoplasm of upper-outer quadrant of left female breast: Secondary | ICD-10-CM

## 2014-08-31 NOTE — Progress Notes (Signed)
Radiation Oncology         [phone] ________________________________  Name: Sheri Arellano MRN: [id number]  Date: 08/31/2014  DOB: 1951/01/16  Follow-Up Visit Note  CC: [name], MD  Magrinat, Virgie Dad, MD  Diagnosis:  Stage I invasive ductal carcinoma of the left breast (triple negative, pT1c, pN0, Mx)   Interval Since Last Radiation:  6  weeks  Narrative:  The patient returns today for routine follow-up.  She is doing well at this time without any itching or discomfort in the breast or fatigue.  Patient has had problems with dyspareunia and will consider vaginal estrogen and vaginal lubricants for this issue.  She denies any nipple discharge or bleeding                              ALLERGIES:  is allergic to erythromycin base.  Meds: Current Outpatient Prescriptions  Medication Sig Dispense Refill  . aspirin EC 81 MG tablet Take 81 mg by mouth daily.      . Cholecalciferol (VITAMIN D) 2000 UNITS tablet Take 2,000 Units by mouth daily.      . clonazePAM (KLONOPIN) 1 MG tablet Take 1 tablet (1 mg total) by mouth at bedtime as needed (sleep).  60 tablet  0  . doxycycline (MONODOX) 100 MG capsule Take 100 mg by mouth at bedtime.       . hyaluronate sodium (RADIAPLEXRX) GEL Apply 1 application topically 2 (two) times daily. Apply after rad txs and bedtime and on weekends to affected breast area      . loratadine (CLARITIN) 10 MG tablet Take 10 mg by mouth daily.      . pantoprazole (PROTONIX) 40 MG tablet Take 40 mg by mouth daily.      . sertraline (ZOLOFT) 100 MG tablet Take 50 mg by mouth daily.       . simvastatin (ZOCOR) 40 MG tablet Take 20 mg by mouth at bedtime.       . valACYclovir (VALTREX) 1000 MG tablet Take 1 g by mouth daily.      Marland Kitchen docusate sodium (COLACE) 100 MG capsule Take 100 mg by mouth 2 (two) times daily.      Marland Kitchen HYDROcodone-acetaminophen (NORCO) 5-325 MG per tablet Take 1-2 tablets by mouth every 6 (six) hours as needed for moderate pain or severe  pain.  30 tablet  0  . metoprolol tartrate (LOPRESSOR) 25 MG tablet Take 25 mg by mouth as needed (tachycardia).       . nitroGLYCERIN (NITROSTAT) 0.4 MG SL tablet Place 0.4 mg under the tongue every 5 (five) minutes as needed (Esophageal spasms).       . non-metallic deodorant Jethro Poling) MISC Apply 1 application topically daily. Apply after rad txs daily and prn       No current facility-administered medications for this encounter.    Physical Findings: The patient is in no acute distress. Patient is alert and oriented.  weight is 158 lb 4.8 oz (71.804 kg). Her oral temperature is 97.6 F (36.4 C). Her blood pressure is 98/80 and her pulse is 67. Her respiration is 16. Marland Kitchen  No palpable supraclavicular or axillary adenopathy. Lungs clear to auscultation. The heart has regular rhythm and rate. Examination of the right breast reveals no mass or nipple discharge. Examination of the left breast reveals some edema in the nipple areolar complex area. The patient has some very mild dry desquamation. Overall the patient's skin  is well healed at this time. There is no dominant mass appreciated breast nipple discharge or bleeding. She has a scab over the sternum area from recent removal of skin lesion which turned out to be benign according to the patient.  Lab Findings: Lab Results  Component Value Date   WBC 4.1 08/02/2014   HGB 14.2 08/02/2014   HCT 41.6 08/02/2014   MCV 92.2 08/02/2014   PLT 236 08/02/2014      Radiographic Findings: No results found.  Impression:  The patient is recovering from the effects of radiation. No evidence of recurrence on clinical exam today  Plan:  Routine followup in 6 months.  ____________________________________ Blair Promise, MD

## 2014-08-31 NOTE — Progress Notes (Addendum)
Follow up s/p radd txs left breast 06/05/14-07/18/14, breast well healed, mid chest scab,wentr to dermatologist, and had removed growth and bx was benign stated, had port a ccath removed this past Wednesday, no pain, appetite good, energy level good, mammogram to be scheduled in January 2016 9:04 AM

## 2014-09-05 ENCOUNTER — Other Ambulatory Visit (HOSPITAL_COMMUNITY)
Admission: RE | Admit: 2014-09-05 | Discharge: 2014-09-05 | Disposition: A | Discharge status: Home / Self Care | Payer: BC Managed Care – PPO | Source: Ambulatory Visit | Attending: Obstetrics and Gynecology | Admitting: Obstetrics and Gynecology

## 2014-09-05 ENCOUNTER — Other Ambulatory Visit: Payer: Self-pay | Admitting: Obstetrics and Gynecology

## 2014-09-05 DIAGNOSIS — Z01419 Encounter for gynecological examination (general) (routine) without abnormal findings: Secondary | ICD-10-CM | POA: Insufficient documentation

## 2014-09-06 LAB — CYTOLOGY - PAP

## 2014-09-20 ENCOUNTER — Telehealth: Payer: Self-pay | Admitting: Emergency Medicine

## 2014-09-20 ENCOUNTER — Other Ambulatory Visit: Payer: Self-pay | Admitting: Emergency Medicine

## 2014-09-20 NOTE — Telephone Encounter (Signed)
Patient called with complaints of bilateral hand stiffness. States she is unable to make a fist on both hands. States that it is worse in the AM and states that her ring fingers are worse and almost seems like she has a "trigger finger" in both ring fingers. Patient denies any difficulty with ADL's.   Patient also inquiring about whether or not she needs more frequent mammograms, breast US and breat MRI's due to her cancer dx.   Will forward this to Dr Jana Hakim and notify patient of any further instructions.

## 2014-09-21 ENCOUNTER — Telehealth: Payer: Self-pay | Admitting: Oncology

## 2014-09-21 ENCOUNTER — Other Ambulatory Visit: Payer: Self-pay | Admitting: *Deleted

## 2014-09-21 NOTE — Telephone Encounter (Signed)
Lft msg for pt labs/ov per 10/14 POF, mailed sch....  Cherylann Banas

## 2014-09-25 ENCOUNTER — Other Ambulatory Visit: Payer: Self-pay | Admitting: Oncology

## 2014-09-25 NOTE — Progress Notes (Unsigned)
Sheri Arellano called to report that she is having some stiffness in both hands, can't make a fist with her left hand, also her left foot is a little bit swollen. She has trouble getting her fourth fingers in either hand to be flexible, like she is developing a trigger finger she says. The symptoms are not constant although there are quite go away. There are clearly worse in the morning. She has had bilateral carpal tunnel her surgeries remotely.  I suggested she get wrist splints and wear them at the very least at night, if she can tolerate them she can wear them during the day as well. Of course she could be having neuropathy due to the Taxotere, but it would be rare for this to become worse at this point.  I don't have a simple answer for swelling. I cancer I put her on a low dose of hydrochlorothiazide, but her blood pressure is a ready on the low side. We discussed going off sodium as much as possible.  She will let me know if symptoms don't improve. Otherwise she will see me again in February.

## 2014-12-15 ENCOUNTER — Encounter (HOSPITAL_COMMUNITY): Payer: Self-pay | Admitting: *Deleted

## 2015-01-08 ENCOUNTER — Other Ambulatory Visit: Payer: Self-pay | Admitting: *Deleted

## 2015-01-08 DIAGNOSIS — C50412 Malignant neoplasm of upper-outer quadrant of left female breast: Secondary | ICD-10-CM

## 2015-01-12 ENCOUNTER — Other Ambulatory Visit: Payer: Self-pay | Admitting: Medical Oncology

## 2015-01-12 ENCOUNTER — Telehealth: Payer: Self-pay | Admitting: Medical Oncology

## 2015-01-12 ENCOUNTER — Telehealth: Payer: Self-pay | Admitting: Oncology

## 2015-01-12 DIAGNOSIS — C50412 Malignant neoplasm of upper-outer quadrant of left female breast: Secondary | ICD-10-CM

## 2015-01-12 NOTE — Telephone Encounter (Signed)
Confirm appt for Mammo for 02/18 @ 9:45. Patient will check to see if maybe later appt.

## 2015-01-12 NOTE — Telephone Encounter (Signed)
Pt asking about diagnostic mammogram and wanting 3 d ultrasound also and she will pay out of pocket for extra cost. Order sent to be done at [facility], Onc tx request done also

## 2015-01-29 ENCOUNTER — Other Ambulatory Visit (HOSPITAL_BASED_OUTPATIENT_CLINIC_OR_DEPARTMENT_OTHER): Payer: BLUE CROSS/BLUE SHIELD

## 2015-01-29 ENCOUNTER — Telehealth: Payer: Self-pay | Admitting: Oncology

## 2015-01-29 ENCOUNTER — Ambulatory Visit (HOSPITAL_BASED_OUTPATIENT_CLINIC_OR_DEPARTMENT_OTHER): Payer: BLUE CROSS/BLUE SHIELD | Admitting: Oncology

## 2015-01-29 VITALS — BP 126/75 | HR 67 | Temp 98.3°F | Resp 18 | Ht 62.0 in | Wt 151.8 lb

## 2015-01-29 DIAGNOSIS — C50919 Malignant neoplasm of unspecified site of unspecified female breast: Secondary | ICD-10-CM

## 2015-01-29 DIAGNOSIS — Z853 Personal history of malignant neoplasm of breast: Secondary | ICD-10-CM

## 2015-01-29 DIAGNOSIS — C50412 Malignant neoplasm of upper-outer quadrant of left female breast: Secondary | ICD-10-CM

## 2015-01-29 LAB — COMPREHENSIVE METABOLIC PANEL (CC13)
ALT: 18 U/L (ref 0–55)
AST: 17 U/L (ref 5–34)
Albumin: 4.1 g/dL (ref 3.5–5.0)
Alkaline Phosphatase: 69 U/L (ref 40–150)
Anion Gap: 10 mEq/L (ref 3–11)
BUN: 8.7 mg/dL (ref 7.0–26.0)
CO2: 26 mEq/L (ref 22–29)
Calcium: 9.5 mg/dL (ref 8.4–10.4)
Chloride: 101 mEq/L (ref 98–109)
Creatinine: 0.8 mg/dL (ref 0.6–1.1)
EGFR: 84 mL/min/{1.73_m2} — ABNORMAL LOW (ref >90)
Glucose: 110 mg/dl (ref 70–140)
Potassium: 4 mEq/L (ref 3.5–5.1)
Sodium: 137 mEq/L (ref 136–145)
Total Bilirubin: 0.72 mg/dL (ref 0.20–1.20)
Total Protein: 6.7 g/dL (ref 6.4–8.3)

## 2015-01-29 NOTE — Telephone Encounter (Signed)
per pof to sch pt appt-gave pt copy of sch °

## 2015-01-29 NOTE — Progress Notes (Signed)
[place]  Telephone:([phone]) [phone] Fax:(336) [phone]     ID: Sheri Arellano OB: 25-Sep-1951  MR#: [id number]  JJK#:[id number]  PCP: Sheri Screws, MD GYN:  Sheri Arellano SU: Sheri Arellano OTHER MD: Sheri Arellano, [facility] Sheri Arellano  CHIEF COMPLAINT: Early-stage breast cancer  CURRENT THERAPY: Observation  BREAST CANCER HISTORY: From the original intake note:  Sheri Arellano had routine screening mammography at [facility] 11/21/2013 showing an irregular density in the left breast at the 1:00 position. Ultrasound was obtained 11/29/2013 and showed a 4 mm tolerate them live irregular mass in the left breast at the 2:00 position. This was hypoechoic. Biopsy of this area 12/12/2013 showed (SAA [id number]) an invasive ductal carcinoma, grade 1 or 2, estrogen receptor 0%, progesterone receptor 0%, with no HER-2 amplification, the signals ratio being 1.09 and the number per cell be 1.95. The MIB-1-1 was 72%.  The patient subsequent history is as detailed below  INTERVAL HISTORY: Sheri Arellano returns today for followup of her breast cancer accompanied by her husband Sheri Arellano. She continues to work full-time. When asked were her worse problem is she says it is the fact that she has a trigger finger in both hands fourth digits. She has had bilateral carpal tunnel surgery in the past of course. She is currently not exercising on a regular basis, although she does have exercise equipment at home.  REVIEW OF SYSTEMS: Sheri Arellano admits to some anxiety regarding her breast cancer diagnosis. She wonders whether she needs to have mammography every 6 months for example. She would like to know if there is a blood test that can guarantee that she does not have cancer at present. Aside from this issue, a detailed review of systems today was entirely benign  PAST MEDICAL HISTORY: Past Medical History  Diagnosis Date  . Esophageal dysmotility   . Anxiety     probable panic attacks  . Insomnia   . GERD  (gastroesophageal reflux disease)   . Hyperlipidemia   . Dysphagia   . Vitamin D deficiency   . Other malaise and fatigue   . SVT (supraventricular tachycardia)     documented-takes metoprolol as needed  . H/O hiatal hernia   . Cancer     breast  . Iron deficiency anemia     hx  . Radiation 06/05/14-07/18/14    left breast 50.4 gray, lumpectomy cavity boosted to 60.4 gray    PAST SURGICAL HISTORY: Past Surgical History  Procedure Laterality Date  . Carpal tunnel release      lt and rt  . Cholecystectomy  1999  . Partial mastectomy with needle localization and axillary sentinel lymph node bx Left 01/16/2014    Procedure: PARTIAL MASTECTOMY WITH NEEDLE LOCALIZATION AND AXILLARY SENTINEL LYMPH NODE BIOPSY;  Surgeon: Sheri Hector, MD;  Location: [place];  Service: General;  Laterality: Left;  . Portacath placement Right 02/28/2014    Procedure: INSERTION PORT-A-CATH;  Surgeon: Sheri Hector, MD;  Location: [place];  Service: General;  Laterality: Right;  . Port-a-cath removal Right 08/23/2014    Procedure: MINOR REMOVAL PORT-A-CATH;  Surgeon: Sheri Skates, MD;  Location: [place];  Service: General;  Laterality: Right;    FAMILY HISTORY Family History  Problem Relation Age of Onset  . Atrial fibrillation Mother   . Hyperlipidemia Mother   . Hypertension Mother   . Cancer Father     bladder  . Cancer Maternal Aunt     breast   the patient's parents are  living, both in their 90s. The patient had one brother and one no sisters. One of the patient's mothers 3 sisters was diagnosed with breast cancer before the age of 60. There is no history of ovarian cancer in the family.  GYNECOLOGIC HISTORY:  (Reviewed 05/02/2014) Menarche age 63, first live birth age 46, the patient is Sheri Arellano. She underwent menopause at age 32, and has been on hormone replacement for the last 23 years (estradiol and norethindrone most recently). 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 Sheri Arellano used to work for [employer] but is currently retired. Their son date lives in [place] where he owns an Electronics engineer and [employer] products. Son Sheri Arellano lives in [place], currently working for [employer]. The patient has 3 grandchildren. She is a Furniture conservator/restorer.    ADVANCED DIRECTIVES: In place   HEALTH MAINTENANCE: (Reviewed 05/02/2014) History  Substance Use Topics  . Smoking status: Former Smoker -- 1.50 packs/day for 8 years    Types: Cigarettes    Quit date: 01/22/1976  . Smokeless tobacco: Former Systems developer    Quit date: 02/16/1975  . Alcohol Use: 4.8 oz/week    1 Glasses of wine, 7 Shots of liquor per week     Comment: rarely     Colonoscopy: Not on file  PAP: March 2014, Dr. Inda Arellano  Bone density: Not on file  Lipid panel: Not on file   Allergies  Allergen Reactions  . Erythromycin Base Other (See Comments)    Stomach ache    Current Outpatient Prescriptions  Medication Sig Dispense Refill  . aspirin EC 81 MG tablet Take 81 mg by mouth daily.    . Cholecalciferol (VITAMIN D) 2000 UNITS tablet Take 2,000 Units by mouth daily.    . clonazePAM (KLONOPIN) 1 MG tablet Take 1 tablet (1 mg total) by mouth at bedtime as needed (sleep). 60 tablet 0  . doxycycline (MONODOX) 100 MG capsule Take 100 mg by mouth at bedtime.     Marland Kitchen HYDROcodone-acetaminophen (NORCO) 5-325 MG per tablet Take 1-2 tablets by mouth every 6 (six) hours as needed for moderate pain or severe pain. (Patient not taking: Reported on 12/07/2014) 30 tablet 0  . loratadine (CLARITIN) 10 MG tablet Take 10 mg by mouth daily.    . metoprolol tartrate (LOPRESSOR) 25 MG tablet Take 25 mg by mouth as needed (tachycardia).     . nitroGLYCERIN (NITROSTAT) 0.4 MG SL tablet Place 0.4 mg under the tongue every 5 (five) minutes as needed (Esophageal spasms).     . pantoprazole (PROTONIX) 40 MG tablet Take 40 mg by  mouth daily.    . sertraline (ZOLOFT) 100 MG tablet Take 50 mg by mouth every morning.     . simvastatin (ZOCOR) 40 MG tablet Take 20 mg by mouth at bedtime.     . valACYclovir (VALTREX) 1000 MG tablet Take 1 g by mouth daily as needed (fever blisters).      No current facility-administered medications for this visit.    OBJECTIVE: Middle-aged white Arellano who appears well Filed Vitals:   01/29/15 1031  BP: 126/75  Pulse: 67  Temp: 98.3 F (36.8 C)  Resp: 18     Body mass index is 27.76 kg/(m^2).    ECOG FS: 0 Filed Weights   01/29/15 1031  Weight: 151 lb 12.8 oz (68.856 kg)   Sclerae unicteric, EOMs intact Oropharynx clear and moist No cervical or  supraclavicular adenopathy Lungs no rales or rhonchi Heart regular rate and rhythm Abd soft, nontender, positive bowel sounds MSK no focal spinal tenderness, no upper extremity lymphedema Neuro: nonfocal, well oriented, appropriate affect Breasts: The right breast is unremarkable. The left breast is status post lumpectomy and radiation. There are no skin or nipple changes of concern. There is no evidence of liver recurrence. The left axilla is benign.  LAB RESULTS:  Lab Results  Component Value Date   WBC 4.1 08/02/2014   NEUTROABS 2.6 08/02/2014   HGB 14.2 08/02/2014   HCT 41.6 08/02/2014   MCV 92.2 08/02/2014   PLT 236 08/02/2014      Chemistry      Component Value Date/Time   NA 137 08/02/2014 1012   NA 140 02/27/2014 1420   K 3.5 08/02/2014 1012   K 3.4* 02/27/2014 1420   CL 100 02/27/2014 1420   CO2 28 08/02/2014 1012   CO2 27 02/27/2014 1420   BUN 9.3 08/02/2014 1012   BUN 12 02/27/2014 1420   CREATININE 0.7 08/02/2014 1012   CREATININE 0.78 02/27/2014 1420      Component Value Date/Time   CALCIUM 9.4 08/02/2014 1012   CALCIUM 9.3 02/27/2014 1420   ALKPHOS 54 08/02/2014 1012   AST 15 08/02/2014 1012   ALT 16 08/02/2014 1012   BILITOT 0.39 08/02/2014 1012       STUDIES: Mammography at [facility] earlier  this month showed no evidence of cancer. She has a breast density category C  ASSESSMENT: 64 y.o. Sheri Arellano status post left breast biopsy 12/12/2013 for a clinical T1a N0 invasive ductal carcinoma, low to intermediate grade, triple negative, with an MIB-1 of 72%  (1) status post left lumpectomy and sentinel lymph node sampling 01/16/2014 for a pT1c pN0, stage IA invasive ductal carcinoma, grade 3, repeat prognostic panel again triple negative.  (2)  adjuvant chemotherapy consisting of 4 cycles of docetaxel/cyclophosphamide given on a Q. three-week basis completed 05/15/2014  (3) adjuvant radiation completed 07/18/2014   PLAN: Sheri Arellano is now a year out from her definitive surgery with no evidence of breast cancer recurrence. She understands in general she has a good prognosis. We discussed the fact that doing mammography every 6 months as opposed to every 12 months has not been shown to make a difference in terms of survival. Furthermore this would not be paid for by insurance. I think what would help is to make sure she gets the "3-D" tomography every time she gets mammography. She is doing that. That increases the sensitivity of mammograms by 10 or 15%, and this is particularly important in patients like her, who have dense breasts.  Similarly we do not have data that doing yearly ultrasonography or yearly MRI improves survival in patients like her.  She would like to be evaluated more frequently. We will obtain labwork every 3 months and do a visit every 6 months at least another year at her request.  I have strongly urged her to start an exercise program. This will not only make her feel better but it will also reduce her risk of breast cancer marginally brought measurably.  Finally we went over the fact that there is no blood test that guarantees that a person has or does not have cancer. The tumor marker for breast cancer, CA-27-29 or CA 15-3, are both too imperfect to use for routine  screening. We will however upped in routine C metastases and CBCs at every visit here  Sheri Arellano has a  good understanding of the overall plan. She agrees with it. She knows the goal of treatment in her case is cure. She will call with any problems that may develop before the next visit here.  Chauncey Cruel, MD   01/29/2015 10:37 AM

## 2015-03-09 ENCOUNTER — Encounter (HOSPITAL_COMMUNITY): Payer: Self-pay | Admitting: *Deleted

## 2015-03-20 ENCOUNTER — Ambulatory Visit (HOSPITAL_COMMUNITY): Payer: BLUE CROSS/BLUE SHIELD | Admitting: Anesthesiology

## 2015-03-20 ENCOUNTER — Encounter (HOSPITAL_COMMUNITY)
Admission: RE | Disposition: A | Discharge status: Home / Self Care | Payer: Self-pay | Source: Ambulatory Visit | Attending: Gastroenterology

## 2015-03-20 ENCOUNTER — Ambulatory Visit (HOSPITAL_COMMUNITY)
Admission: RE | Admit: 2015-03-20 | Discharge: 2015-03-20 | Disposition: A | Discharge status: Home / Self Care | Payer: BLUE CROSS/BLUE SHIELD | Source: Ambulatory Visit | Attending: Gastroenterology | Admitting: Gastroenterology

## 2015-03-20 DIAGNOSIS — Z1211 Encounter for screening for malignant neoplasm of colon: Secondary | ICD-10-CM | POA: Diagnosis present

## 2015-03-20 DIAGNOSIS — Z9049 Acquired absence of other specified parts of digestive tract: Secondary | ICD-10-CM | POA: Insufficient documentation

## 2015-03-20 DIAGNOSIS — K219 Gastro-esophageal reflux disease without esophagitis: Secondary | ICD-10-CM | POA: Insufficient documentation

## 2015-03-20 DIAGNOSIS — Z87891 Personal history of nicotine dependence: Secondary | ICD-10-CM | POA: Diagnosis not present

## 2015-03-20 DIAGNOSIS — E559 Vitamin D deficiency, unspecified: Secondary | ICD-10-CM | POA: Diagnosis not present

## 2015-03-20 HISTORY — PX: COLONOSCOPY WITH PROPOFOL: SHX5780

## 2015-03-20 SURGERY — COLONOSCOPY WITH PROPOFOL
Anesthesia: Monitor Anesthesia Care

## 2015-03-20 MED ORDER — PROPOFOL 10 MG/ML IV BOLUS
INTRAVENOUS | Status: AC
  Filled 2015-03-20: qty 20

## 2015-03-20 MED ORDER — PROPOFOL 10 MG/ML IV BOLUS
INTRAVENOUS | Status: DC | PRN
  Administered 2015-03-20: 20 mg via INTRAVENOUS
  Administered 2015-03-20: 50 mg via INTRAVENOUS
  Administered 2015-03-20: 30 mg via INTRAVENOUS
  Administered 2015-03-20: 50 mg via INTRAVENOUS
  Administered 2015-03-20: 20 mg via INTRAVENOUS
  Administered 2015-03-20: 30 mg via INTRAVENOUS
  Administered 2015-03-20: 20 mg via INTRAVENOUS
  Administered 2015-03-20: 30 mg via INTRAVENOUS
  Administered 2015-03-20: 10 mg via INTRAVENOUS
  Administered 2015-03-20 (×2): 20 mg via INTRAVENOUS

## 2015-03-20 MED ORDER — LACTATED RINGERS IV SOLN
INTRAVENOUS | Status: DC | PRN
  Administered 2015-03-20: 09:00:00 via INTRAVENOUS

## 2015-03-20 SURGICAL SUPPLY — 22 items

## 2015-03-20 NOTE — Anesthesia Postprocedure Evaluation (Signed)
  Anesthesia Post-op Note  Patient: Sheri Arellano  Procedure(s) Performed: Procedure(s) (LRB): COLONOSCOPY WITH PROPOFOL (N/A)  Patient Location: PACU  Anesthesia Type: MAC  Level of Consciousness: awake and alert   Airway and Oxygen Therapy: Patient Spontanous Breathing  Post-op Pain: mild  Post-op Assessment: Post-op Vital signs reviewed, Patient's Cardiovascular Status Stable, Respiratory Function Stable, Patent Airway and No signs of Nausea or vomiting  Last Vitals:  Filed Vitals:   03/20/15 1123  BP: 121/74  Pulse: 64  Temp:   Resp: 15    Post-op Vital Signs: stable   Complications: No apparent anesthesia complications

## 2015-03-20 NOTE — Anesthesia Preprocedure Evaluation (Signed)
Anesthesia Evaluation  Patient identified by MRN, date of birth, ID band Patient awake    Reviewed: Allergy & Precautions, NPO status , Patient's Chart, lab work & pertinent test results  Airway Mallampati: II  TM Distance: >3 FB Neck ROM: Full    Dental no notable dental hx.    Pulmonary neg pulmonary ROS, former smoker,  breath sounds clear to auscultation  Pulmonary exam normal       Cardiovascular negative cardio ROS  Rhythm:Regular Rate:Normal     Neuro/Psych negative neurological ROS  negative psych ROS   GI/Hepatic Neg liver ROS, GERD-  Medicated,  Endo/Other  negative endocrine ROS  Renal/GU negative Renal ROS  negative genitourinary   Musculoskeletal negative musculoskeletal ROS (+)   Abdominal   Peds negative pediatric ROS (+)  Hematology negative hematology ROS (+)   Anesthesia Other Findings   Reproductive/Obstetrics negative OB ROS                             Anesthesia Physical Anesthesia Plan  ASA: II  Anesthesia Plan: MAC   Post-op Pain Management:    Induction: Intravenous  Airway Management Planned: Simple Face Mask  Additional Equipment:   Intra-op Plan:   Post-operative Plan:   Informed Consent: I have reviewed the patients History and Physical, chart, labs and discussed the procedure including the risks, benefits and alternatives for the proposed anesthesia with the patient or authorized representative who has indicated his/her understanding and acceptance.   Dental advisory given  Plan Discussed with: CRNA and Surgeon  Anesthesia Plan Comments:         Anesthesia Quick Evaluation

## 2015-03-20 NOTE — H&P (Signed)
  Procedure: Screening colonoscopy. 08/30/2003 normal screening colonoscopy using propofol sedation  History: The patient is a 64 year old female born 26-Jul-1951. She is scheduled to undergo a screening colonoscopy today.  Past medical history: Cholecystectomy. Carpal tunnel surgery. Gastroesophageal reflux. Hyperlipidemia. Vitamin D deficiency. Hemorrhoid surgery.  Medication allergies: Mycins  EXAM: Patient is alert and lying comfortably on the endoscopy stretcher. Abdomen is soft and nontender to palpation. Lungs are clear to auscultation. Cardiac exam reveals a regular rhythm.  Plan: Proceed with screening colonoscopy

## 2015-03-20 NOTE — Transfer of Care (Signed)
Immediate Anesthesia Transfer of Care Note  Patient: Sheri Arellano  Procedure(s) Performed: Procedure(s): COLONOSCOPY WITH PROPOFOL (N/A)  Patient Location: PACU  Anesthesia Type:MAC  Level of Consciousness: sedated  Airway & Oxygen Therapy: Patient Spontanous Breathing and Patient connected to nasal cannula oxygen  Post-op Assessment: Report given to RN and Post -op Vital signs reviewed and stable  Post vital signs: Reviewed and stable  Last Vitals:  Filed Vitals:   03/20/15 0919  BP: 119/73  Pulse: 70  Resp: 7    Complications: No apparent anesthesia complications

## 2015-03-20 NOTE — Op Note (Signed)
Procedure: Screening colonoscopy. Normal screening colonoscopy performed on 08/30/2003  Endoscopist: Earle Gell  Premedication: Propofol administered by anesthesia  Procedure: The patient was placed in the left lateral decubitus position. Anal inspection and digital rectal exam were normal. The Pentax pediatric colonoscope was introduced into the rectum and advanced to the cecum. A normal-appearing appendiceal orifice and ileocecal valve were identified. Colonic preparation for the exam today was good. Withdrawal time was 13 minutes  Rectum. Normal. Retroflexed view of the distal rectum normal  Sigmoid colon and descending colon. Normal  Splenic flexure. Normal  Transverse colon. Normal.  Hepatic flexure. Normal  Ascending colon. Normal  Cecum and ileocecal valve. Normal  Assessment: Normal screening colonoscopy  Recommendation: Schedule repeat screening colonoscopy in 10 years

## 2015-03-21 ENCOUNTER — Encounter (HOSPITAL_COMMUNITY): Payer: Self-pay | Admitting: Gastroenterology

## 2015-04-16 ENCOUNTER — Encounter (HOSPITAL_COMMUNITY): Payer: Self-pay | Admitting: General Practice

## 2015-04-16 ENCOUNTER — Emergency Department (HOSPITAL_COMMUNITY)
Admission: EM | Admit: 2015-04-16 | Discharge: 2015-04-16 | Disposition: A | Discharge status: Home / Self Care | Payer: BLUE CROSS/BLUE SHIELD | Attending: Emergency Medicine | Admitting: Emergency Medicine

## 2015-04-16 ENCOUNTER — Emergency Department (HOSPITAL_COMMUNITY): Payer: BLUE CROSS/BLUE SHIELD

## 2015-04-16 DIAGNOSIS — E785 Hyperlipidemia, unspecified: Secondary | ICD-10-CM | POA: Insufficient documentation

## 2015-04-16 DIAGNOSIS — K219 Gastro-esophageal reflux disease without esophagitis: Secondary | ICD-10-CM | POA: Insufficient documentation

## 2015-04-16 DIAGNOSIS — I471 Supraventricular tachycardia: Secondary | ICD-10-CM | POA: Insufficient documentation

## 2015-04-16 DIAGNOSIS — Z862 Personal history of diseases of the blood and blood-forming organs and certain disorders involving the immune mechanism: Secondary | ICD-10-CM | POA: Insufficient documentation

## 2015-04-16 DIAGNOSIS — E559 Vitamin D deficiency, unspecified: Secondary | ICD-10-CM | POA: Diagnosis not present

## 2015-04-16 DIAGNOSIS — Z792 Long term (current) use of antibiotics: Secondary | ICD-10-CM | POA: Diagnosis not present

## 2015-04-16 DIAGNOSIS — R002 Palpitations: Secondary | ICD-10-CM | POA: Diagnosis present

## 2015-04-16 DIAGNOSIS — Z8719 Personal history of other diseases of the digestive system: Secondary | ICD-10-CM | POA: Diagnosis not present

## 2015-04-16 DIAGNOSIS — Z79899 Other long term (current) drug therapy: Secondary | ICD-10-CM | POA: Insufficient documentation

## 2015-04-16 DIAGNOSIS — Z8659 Personal history of other mental and behavioral disorders: Secondary | ICD-10-CM | POA: Diagnosis not present

## 2015-04-16 DIAGNOSIS — Z87891 Personal history of nicotine dependence: Secondary | ICD-10-CM | POA: Insufficient documentation

## 2015-04-16 DIAGNOSIS — Z853 Personal history of malignant neoplasm of breast: Secondary | ICD-10-CM | POA: Diagnosis not present

## 2015-04-16 DIAGNOSIS — Z7982 Long term (current) use of aspirin: Secondary | ICD-10-CM | POA: Diagnosis not present

## 2015-04-16 LAB — I-STAT TROPONIN, ED
Troponin i, poc: 0.13 ng/mL (ref 0.00–0.08)
Troponin i, poc: 0.79 ng/mL (ref 0.00–0.08)

## 2015-04-16 LAB — I-STAT CHEM 8, ED
BUN: 9 mg/dL (ref 6–20)
Calcium, Ion: 1.15 mmol/L (ref 1.13–1.30)
Chloride: 100 mmol/L — ABNORMAL LOW (ref 101–111)
Creatinine, Ser: 0.8 mg/dL (ref 0.44–1.00)
Glucose, Bld: 98 mg/dL (ref 70–99)
HCT: 39 % (ref 36.0–46.0)
Hemoglobin: 13.3 g/dL (ref 12.0–15.0)
Potassium: 3.8 mmol/L (ref 3.5–5.1)
Sodium: 137 mmol/L (ref 135–145)
TCO2: 21 mmol/L (ref 0–100)

## 2015-04-16 LAB — CBC
HCT: 37.6 % (ref 36.0–46.0)
Hemoglobin: 13.2 g/dL (ref 12.0–15.0)
MCH: 32.4 pg (ref 26.0–34.0)
MCHC: 35.1 g/dL (ref 30.0–36.0)
MCV: 92.4 fL (ref 78.0–100.0)
Platelets: 222 10*3/uL (ref 150–400)
RBC: 4.07 MIL/uL (ref 3.87–5.11)
RDW: 12.8 % (ref 11.5–15.5)
WBC: 7.5 10*3/uL (ref 4.0–10.5)

## 2015-04-16 MED ORDER — METOPROLOL TARTRATE 25 MG PO TABS: 12.5000 mg | ORAL_TABLET | Freq: Two times a day (BID) | ORAL | Status: DC

## 2015-04-16 NOTE — ED Notes (Signed)
Cardiology at bedside.

## 2015-04-16 NOTE — Discharge Instructions (Signed)
Medications and follow-up as per cardiology.

## 2015-04-16 NOTE — H&P (Signed)
Cardiologist: Korinna Tat is an 64 y.o. female.   Chief Complaint:  Chest Pain HPI:   Sheri Arellano is a 64 y.o. female with a history of SVT with documented tachycardia and symptoms suggestive of AV reentry.  She has been on beta blocker therapy.  She has not been seen in the office since 01/2013.  Prior to this she had 3 or 4 episodes over a two year period. She had one episode on her way to [place]. EMS called after her beta blockers did not work; Valsalva maneuvers were successful and she went on her way to [place].   She had a stress echo in Dec 2013 which was normal.  Sn echocardiogram 2007 demonstrated normal valvular and ventricular function.  Her history also includes breast cancer, for which she was undergoing radiation therapy last summer, esophageal dysmotility, GERD, HLD, iron def. anemia.  She presents today in SVT.  She was getting ready for work when she started feeling palpitations and knew she was in SVT.  She tried vagal maneuvers with no effects.  She took 50mg  lopressor.  EMS arrived and gave 6mg  Adenosine then 12mg .  About 15 mins later, while in the ambulance, they hit a bump and she converted.  She reports chest tightness while in SVT.  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  The patient currently denies nausea, vomiting, fever, orthopnea, PND, cough, congestion, abdominal pain, hematochezia, melena, lower extremity edema, claudication.    Medications:  Medication Sig  aspirin EC 81 MG tablet Take 81 mg by mouth daily.  Cholecalciferol (VITAMIN D) 2000 UNITS tablet Take 2,000 Units by mouth daily.  doxycycline (MONODOX) 100 MG capsule Take 100 mg by mouth at bedtime.   loratadine (CLARITIN) 10 MG tablet Take 10 mg by mouth every evening.   metoprolol tartrate (LOPRESSOR) 25 MG tablet Take 25 mg by mouth as needed (tachycardia).   pantoprazole (PROTONIX) 40 MG tablet Take 40 mg by mouth daily.  sertraline  (ZOLOFT) 100 MG tablet Take 50 mg by mouth every morning.   simvastatin (ZOCOR) 40 MG tablet Take 20 mg by mouth at bedtime.   nitroGLYCERIN (NITROSTAT) 0.4 MG SL tablet Place 0.4 mg under the tongue every 5 (five) minutes as needed (Esophageal spasms).   valACYclovir (VALTREX) 1000 MG tablet Take 1 g by mouth daily as needed (fever blisters).      Past Medical History  Diagnosis Date  . Esophageal dysmotility   . Anxiety     probable panic attacks  . Insomnia   . GERD (gastroesophageal reflux disease)   . Hyperlipidemia   . Dysphagia   . Vitamin D deficiency   . Other malaise and fatigue   . SVT (supraventricular tachycardia) none in last 5 years per pt    documented-takes metoprolol as needed  . H/O hiatal hernia   . Cancer     breast  . Radiation 06/05/14-07/18/14    left breast 50.4 gray, lumpectomy cavity boosted to 60.4 gray  . Iron deficiency anemia     hx    Past Surgical History  Procedure Laterality Date  . Carpal tunnel release      lt and rt  . Cholecystectomy  1999  . Partial mastectomy with needle localization and axillary sentinel lymph node bx Left 01/16/2014    Procedure: PARTIAL MASTECTOMY WITH NEEDLE LOCALIZATION AND AXILLARY SENTINEL LYMPH NODE BIOPSY;  Surgeon: [name], MD;  Location: [facility];  Service: General;  Laterality: Left;  . Portacath placement Right 02/28/2014    Procedure: INSERTION PORT-A-CATH;  Surgeon: [name], MD;  Location: [facility];  Service: General;  Laterality: Right;  . Port-a-cath removal Right 08/23/2014    Procedure: MINOR REMOVAL PORT-A-CATH;  Surgeon: Fanny Skates, MD;  Location: [facility];  Service: General;  Laterality: Right;  . Colonoscopy with propofol N/A 03/20/2015    Procedure: COLONOSCOPY WITH PROPOFOL;  Surgeon: Garlan Fair, MD;  Location: [facility];  Service: Endoscopy;  Laterality: N/A;    Family History  Problem Relation Age of Onset  . Atrial fibrillation  Mother   . Hyperlipidemia Mother   . Hypertension Mother   . Cancer Father     bladder  . Cancer Maternal Aunt     breast   Social History:  reports that she quit smoking about 39 years ago. Her smoking use included Cigarettes. She has a 12 pack-year smoking history. She has never used smokeless tobacco. She reports that she drinks about 4.2 oz of alcohol per week. She reports that she does not use illicit drugs.  Allergies:  Allergies  Allergen Reactions  . Erythromycin Base Other (See Comments)    Stomach ache     (Not in a hospital admission)  Results for orders placed or performed during the hospital encounter of 04/16/15 (from the past 48 hour(s))  CBC     Status: None   Collection Time: 04/16/15  1:46 PM  Result Value Ref Range   WBC 7.5 4.0 - 10.5 K/uL   RBC 4.07 3.87 - 5.11 MIL/uL   Hemoglobin 13.2 12.0 - 15.0 g/dL   HCT 37.6 36.0 - 46.0 %   MCV 92.4 78.0 - 100.0 fL   MCH 32.4 26.0 - 34.0 pg   MCHC 35.1 30.0 - 36.0 g/dL   RDW 12.8 11.5 - 15.5 %   Platelets 222 150 - 400 K/uL  I-stat troponin, ED     Status: Abnormal   Collection Time: 04/16/15  1:50 PM  Result Value Ref Range   Troponin i, poc 0.13 (HH) 0.00 - 0.08 ng/mL   Comment NOTIFIED PHYSICIAN    Comment 3            Comment: Due to the release kinetics of cTnI, a negative result within the first hours of the onset of symptoms does not rule out myocardial infarction with certainty. If myocardial infarction is still suspected, repeat the test at appropriate intervals.   I-stat chem 8, ed     Status: Abnormal   Collection Time: 04/16/15  1:52 PM  Result Value Ref Range   Sodium 137 135 - 145 mmol/L   Potassium 3.8 3.5 - 5.1 mmol/L   Chloride 100 (L) 101 - 111 mmol/L   BUN 9 6 - 20 mg/dL   Creatinine, Ser 0.80 0.44 - 1.00 mg/dL   Glucose, Bld 98 70 - 99 mg/dL   Calcium, Ion 1.15 1.13 - 1.30 mmol/L   TCO2 21 0 - 100 mmol/L   Hemoglobin 13.3 12.0 - 15.0 g/dL   HCT 39.0 36.0 - 46.0 %   Dg Chest 2  View  04/16/2015   CLINICAL DATA:  Supraventricular tachycardia, palpitations, hypotension  EXAM: CHEST  2 VIEW  COMPARISON:  02/28/2014  FINDINGS: Interval removal of right-sided Port-A-Cath. Heart size at upper limits of normal. Mild central vascular congestion. No focal pulmonary opacity. No pleural effusion. Clips from presumed left lumpectomy.  No acute osseous finding. Cholecystectomy clips are noted.  IMPRESSION: Borderline cardiomegaly without focal acute finding.   Electronically Signed   By: Conchita Paris M.D.   On: 04/16/2015 13:44    Review of Systems  Constitutional: Negative for fever.  HENT: Negative for congestion and sore throat.   Respiratory: Negative for cough and shortness of breath.   Cardiovascular: Positive for chest pain (During SVT) and palpitations. Negative for orthopnea, leg swelling and PND.  Gastrointestinal: Negative for nausea, vomiting, abdominal pain, blood in stool and melena.  Musculoskeletal: Negative for myalgias.  Neurological: Positive for dizziness. Negative for weakness.  All other systems reviewed and are negative.   Blood pressure 93/69, pulse 73, temperature 99.2 F (37.3 C), temperature source Oral, resp. rate 14, height 5' 2.5" (1.588 m), weight 148 lb (67.132 kg), SpO2 98 %. Physical Exam  Nursing note and vitals reviewed. Constitutional: She is oriented to person, place, and time. She appears well-developed and well-nourished. No distress.  HENT:  Head: Normocephalic and atraumatic.  Mouth/Throat: No oropharyngeal exudate.  Eyes: EOM are normal. Pupils are equal, round, and reactive to light. No scleral icterus.  Neck: Normal range of motion. Neck supple.  Cardiovascular: Normal rate, regular rhythm, S1 normal and S2 normal.   No murmur heard. Pulses:      Radial pulses are 2+ on the right side, and 2+ on the left side.       Dorsalis pedis pulses are 2+ on the right side, and 2+ on the left side.  No carotid bruit  Respiratory: Effort  normal and breath sounds normal. She has no wheezes. She has no rales.  GI: Soft. Bowel sounds are normal. She exhibits no distension. There is no tenderness.  Musculoskeletal: She exhibits no edema.  Lymphadenopathy:    She has no cervical adenopathy.  Neurological: She is alert and oriented to person, place, and time. She exhibits normal muscle tone.  Skin: Skin is warm and dry.  Psychiatric: She has a normal mood and affect.     Assessment/Plan  SVT  64 y.o. femalewith a history of SVT with no episodes in over two years.   Normal stress echo in Dec 2013.  She took PRN lopressor 50mg .  Adenosine and vagal maneuvers did not help but she did convert while in the ambulance.  Troponin is mildly elevated at 0.13.  Repeat Troponin POC.  With her history of radiation for breast cancer, it would be a good idea to repeat stress testing as an OP.  Her BP usually runs about 110 sys.  Recommend a low dose of lopressor BID.  No caffeine.  Monitor BP at home.  Will arrange follow up with Dr. Caryl Comes to discuss ablation.    Tarri Fuller, [place] 04/16/2015, 3:02 PM Patient seen and examined. I agree with the assessment and plan as detailed above. See also my additional thoughts below.   The patient tried multiple maneuvers at home. She did not convert to sinus. She received 6 mg and 12 mg of IV adenosine by EMS and she did not convert. She converted to sinus in the ambulance ride into the hospital. She thinks it occurred when they hit a bump in the road. She is quite stable. She mentions that she has had these episodes over the years when she has significant increase stress. She is currently stressed by some family issues. Her 12-lead EKG after converting shows no diagnostic abnormalities. She did have slight chest discomfort when her rate was fast. Her  troponin is 0.13. A second point of care troponin will be obtained. Unless it shows a significant abnormality, we will allow her to go home. We plan to start a small  dose of metoprolol for her to take regularly at 12.5 mg twice a day. Usually she uses the medicine only on a when necessary basis. We will plan outpatient follow-up with Dr. Caryl Comes.  Dola Argyle, MD, [facility] 04/16/2015 4:21 PM

## 2015-04-16 NOTE — ED Notes (Signed)
Dr. Sabra Heck notified of troponin results by B. Yolanda Bonine, EMT

## 2015-04-16 NOTE — ED Provider Notes (Addendum)
Cardiology notified of the increase in troponin to 0.79.    Fredia Sorrow, MD 04/16/15 1658  Got hold of Dr. Ron Parker. I talked to Trish earlier. Parent and the intended for patient to be discharged home with the word was not passed on to me or to nursing down here. Patient is cleared to go home even with the increase in the troponin.  Fredia Sorrow, MD 04/16/15 [phone]

## 2015-04-16 NOTE — ED Notes (Signed)
Pt brought in via [facility]. Pt started having heart palpitations at 1030. When EMS arrived pt was in SVT with a heart rate of 172, and a B/P of 80/56. EMS gave 6 mg of adenosine at 1154 and 12 mg of adenosine at 1200. Pt converted back to NSR at 1230. Pt received a total of 900 cc of NS.

## 2015-04-16 NOTE — ED Provider Notes (Signed)
CSN: [id number]     Arrival date & time 04/16/15  1242 History   First MD Initiated Contact with Patient 04/16/15 1248     Chief Complaint  Patient presents with  . Palpitations     (Consider location/radiation/quality/duration/timing/severity/associated sxs/prior Treatment) HPI Comments: The patient is a 64 year old female, she has a history of SVT in the past, she last had a 2-1/2 years ago, has been given up her prescription and for metoprolol to take as needed. She noticed that this morning at approximately 10:30 she had acute onset of palpitations, shortly thereafter she developed some chest discomfort, this has been persistent, unwavering, initially did not respond to both 6 mg of adenosine and then 12 mg of adenosine. Approximately 3 minutes after receiving the second dose of adenosine the patient was taking some deep breaths in and out and had spontaneous resolution of her arrhythmia back to a normal sinus rhythm. I personally reviewed the rhythm strips from the paramedics and agree with both the pre-and post-adenosine rhythms. At this time the patient has no symptoms. She states that yesterday she had 2 glasses of alcohol, this is not abnormal for her, she has no other new medications, she states it often happens when she has anxiety. She does report that she is supposed to be working today and is mildly anxious about that. She denies swelling of the legs, fevers, chills, nausea, vomiting.  The history is provided by the patient and the EMS personnel.    Past Medical History  Diagnosis Date  . Esophageal dysmotility   . Anxiety     probable panic attacks  . Insomnia   . GERD (gastroesophageal reflux disease)   . Hyperlipidemia   . Dysphagia   . Vitamin D deficiency   . Other malaise and fatigue   . SVT (supraventricular tachycardia) none in last 5 years per pt    documented-takes metoprolol as needed  . H/O hiatal hernia   . Cancer     breast  . Radiation 06/05/14-07/18/14   left breast 50.4 gray, lumpectomy cavity boosted to 60.4 gray  . Iron deficiency anemia     hx   Past Surgical History  Procedure Laterality Date  . Carpal tunnel release      lt and rt  . Cholecystectomy  1999  . Partial mastectomy with needle localization and axillary sentinel lymph node bx Left 01/16/2014    Procedure: PARTIAL MASTECTOMY WITH NEEDLE LOCALIZATION AND AXILLARY SENTINEL LYMPH NODE BIOPSY;  Surgeon: [name], MD;  Location: [facility];  Service: General;  Laterality: Left;  . Portacath placement Right 02/28/2014    Procedure: INSERTION PORT-A-CATH;  Surgeon: [name], MD;  Location: [facility];  Service: General;  Laterality: Right;  . Port-a-cath removal Right 08/23/2014    Procedure: MINOR REMOVAL PORT-A-CATH;  Surgeon: Fanny Skates, MD;  Location: [place];  Service: General;  Laterality: Right;  . Colonoscopy with propofol N/A 03/20/2015    Procedure: COLONOSCOPY WITH PROPOFOL;  Surgeon: Garlan Fair, MD;  Location: [facility];  Service: Endoscopy;  Laterality: N/A;   Family History  Problem Relation Age of Onset  . Atrial fibrillation Mother   . Hyperlipidemia Mother   . Hypertension Mother   . Cancer Father     bladder  . Cancer Maternal Aunt     breast   History  Substance Use Topics  . Smoking status: Former Smoker -- 1.50 packs/day for 8 years    Types: Cigarettes  Quit date: 01/22/1976  . Smokeless tobacco: Never Used  . Alcohol Use: 4.2 oz/week    7 Shots of liquor per week     Comment: 1 vodka per day   OB History    No data available     Review of Systems  All other systems reviewed and are negative.     Allergies  Erythromycin base  Home Medications   Prior to Admission medications   Medication Sig Start Date End Date Taking? Authorizing Provider  aspirin EC 81 MG tablet Take 81 mg by mouth daily.   Yes Historical Provider, MD  Cholecalciferol (VITAMIN D) 2000 UNITS tablet Take 2,000  Units by mouth daily.   Yes Historical Provider, MD  doxycycline (MONODOX) 100 MG capsule Take 100 mg by mouth at bedtime.  04/23/14  Yes Historical Provider, MD  loratadine (CLARITIN) 10 MG tablet Take 10 mg by mouth every evening.    Yes Historical Provider, MD  metoprolol tartrate (LOPRESSOR) 25 MG tablet Take 25 mg by mouth as needed (tachycardia).    Yes Historical Provider, MD  pantoprazole (PROTONIX) 40 MG tablet Take 40 mg by mouth daily.   Yes Historical Provider, MD  sertraline (ZOLOFT) 100 MG tablet Take 50 mg by mouth every morning.    Yes Historical Provider, MD  simvastatin (ZOCOR) 40 MG tablet Take 20 mg by mouth at bedtime.    Yes Historical Provider, MD  nitroGLYCERIN (NITROSTAT) 0.4 MG SL tablet Place 0.4 mg under the tongue every 5 (five) minutes as needed (Esophageal spasms).     Historical Provider, MD  valACYclovir (VALTREX) 1000 MG tablet Take 1 g by mouth daily as needed (fever blisters).  08/29/14   Historical Provider, MD   BP 93/69 mmHg  Pulse 73  Temp(Src) 99.2 F (37.3 C) (Oral)  Resp 14  Ht 5' 2.5" (1.588 m)  Wt 148 lb (67.132 kg)  BMI 26.62 kg/m2  SpO2 98% Physical Exam  Constitutional: She appears well-developed and well-nourished. No distress.  HENT:  Head: Normocephalic and atraumatic.  Mouth/Throat: Oropharynx is clear and moist. No oropharyngeal exudate.  Eyes: Conjunctivae and EOM are normal. Pupils are equal, round, and reactive to light. Right eye exhibits no discharge. Left eye exhibits no discharge. No scleral icterus.  Neck: Normal range of motion. Neck supple. No JVD present. No thyromegaly present.  Cardiovascular: Normal rate, regular rhythm, normal heart sounds and intact distal pulses.  Exam reveals no gallop and no friction rub.   No murmur heard. Pulmonary/Chest: Effort normal and breath sounds normal. No respiratory distress. She has no wheezes. She has no rales.  Abdominal: Soft. Bowel sounds are normal. She exhibits no distension and no  mass. There is no tenderness.  Musculoskeletal: Normal range of motion. She exhibits no edema or tenderness.  Lymphadenopathy:    She has no cervical adenopathy.  Neurological: She is alert. Coordination normal.  Skin: Skin is warm and dry. No rash noted. No erythema.  Psychiatric: She has a normal mood and affect. Her behavior is normal.  Nursing note and vitals reviewed.   ED Course  Procedures (including critical care time) Labs Review Labs Reviewed  I-STAT CHEM 8, ED - Abnormal; Notable for the following:    Chloride 100 (*)    All other components within normal limits  I-STAT TROPOININ, ED - Abnormal; Notable for the following:    Troponin i, poc 0.13 (*)    All other components within normal limits  CBC    Imaging Review  Dg Chest 2 View  04/16/2015   CLINICAL DATA:  Supraventricular tachycardia, palpitations, hypotension  EXAM: CHEST  2 VIEW  COMPARISON:  02/28/2014  FINDINGS: Interval removal of right-sided Port-A-Cath. Heart size at upper limits of normal. Mild central vascular congestion. No focal pulmonary opacity. No pleural effusion. Clips from presumed left lumpectomy. No acute osseous finding. Cholecystectomy clips are noted.  IMPRESSION: Borderline cardiomegaly without focal acute finding.   Electronically Signed   By: Conchita Paris M.D.   On: 04/16/2015 13:44     EKG Interpretation   Date/Time:  Monday Apr 16 2015 12:50:05 EDT Ventricular Rate:  76 PR Interval:  191 QRS Duration: 88 QT Interval:  445 QTC Calculation: 500 R Axis:   55 Text Interpretation:  Sinus rhythm Low voltage, precordial leads  Borderline repolarization abnormality Prolonged QT interval since last  tracing no significant change Confirmed by Noheli Melder  MD, Elizzie Westergard ([id number]) on  04/16/2015 1:51:01 PM      MDM   Final diagnoses:  SVT (supraventricular tachycardia)    The patient is well-appearing, normal heart and lung sounds, vital signs are totally normal, EKG, troponin, i-STAT chemistry  ordered. The patient appears stable, likely needs to follow up outpatient with electrophysiology, she has RD seen them in the past, she states "I need to make the time to get the ablation".  The patient last saw Dr. Caryl Comes in 2014 where he documented that she had had SVT dating back approximately 40 years, there thought that this is related to the AV reentry, they recommended either catheter ablation or beta blockers, the patient has elected to use beta blockers thus far, she may need to pursue other options though this can be done outpatient as long she stays in a stable rhythm.  Trop minimally elevated - d/w Trish with cardiology - will see in consultation.  At change of shift - care signed out to Dr. Rogene Houston pending cardiology recommendations.  Noemi Chapel, MD 04/17/15 [phone]

## 2015-04-23 ENCOUNTER — Other Ambulatory Visit: Payer: BC Managed Care – PPO

## 2015-05-16 ENCOUNTER — Ambulatory Visit (INDEPENDENT_AMBULATORY_CARE_PROVIDER_SITE_OTHER): Payer: BLUE CROSS/BLUE SHIELD | Admitting: Internal Medicine

## 2015-05-16 ENCOUNTER — Encounter: Payer: Self-pay | Admitting: Internal Medicine

## 2015-05-16 VITALS — BP 110/70 | HR 58 | Ht 62.5 in | Wt 152.6 lb

## 2015-05-16 DIAGNOSIS — I471 Supraventricular tachycardia: Secondary | ICD-10-CM

## 2015-05-16 MED ORDER — METOPROLOL TARTRATE 25 MG PO TABS: 25.0000 mg | ORAL_TABLET | Freq: Two times a day (BID) | ORAL | Status: DC

## 2015-05-16 NOTE — Patient Instructions (Addendum)
Medication Instructions:  Your physician recommends that you continue on your current medications as directed. Please refer to the Current Medication list given to you today.  Labwork: None ordered  Testing/Procedures: None ordered  Follow-Up: Will be scheduled once ablation is completed  Any Other Special Instructions Will Be Listed Below (If Applicable). Sherri, RN will call you to arrange ablation

## 2015-05-16 NOTE — Progress Notes (Signed)
Patient Care Team: Josetta Huddle, MD as PCP - General (Internal Medicine)   HPI  Sheri Arellano is a 64 y.o. female Seen in follow-up for SVT with symptoms consistent with AV reentry  she had an episode recently; she ended up in the hospital. These records were reviewed. Apparently edematous and was given en route without success. Stopped spontaneously over going over bumps  this episode was quite discombobulating. Troponins were borderline   She also describes a tachypalpitations syndrome which seems quite distinct from this. It is not as abrupt in onset and is much more easily attenuated by respirations.   Past Medical History  Diagnosis Date  . Esophageal dysmotility   . Anxiety     probable panic attacks  . Insomnia   . GERD (gastroesophageal reflux disease)   . Hyperlipidemia   . Dysphagia   . Vitamin D deficiency   . Other malaise and fatigue   . SVT (supraventricular tachycardia)     documented-takes metoprolol as needed  . H/O hiatal hernia   . Cancer     breast  . Radiation 06/05/14-07/18/14    left breast 50.4 gray, lumpectomy cavity boosted to 60.4 gray  . Iron deficiency anemia     hx    Past Surgical History  Procedure Laterality Date  . Carpal tunnel release      lt and rt  . Cholecystectomy  1999  . Partial mastectomy with needle localization and axillary sentinel lymph node bx Left 01/16/2014    Procedure: PARTIAL MASTECTOMY WITH NEEDLE LOCALIZATION AND AXILLARY SENTINEL LYMPH NODE BIOPSY;  Surgeon: [name], MD;  Location: [facility];  Service: General;  Laterality: Left;  . Portacath placement Right 02/28/2014    Procedure: INSERTION PORT-A-CATH;  Surgeon: [name], MD;  Location: [place];  Service: General;  Laterality: Right;  . Port-a-cath removal Right 08/23/2014    Procedure: MINOR REMOVAL PORT-A-CATH;  Surgeon: Fanny Skates, MD;  Location: [facility];  Service: General;  Laterality: Right;  .  Colonoscopy with propofol N/A 03/20/2015    Procedure: COLONOSCOPY WITH PROPOFOL;  Surgeon: Garlan Fair, MD;  Location: [facility];  Service: Endoscopy;  Laterality: N/A;    Current Outpatient Prescriptions  Medication Sig Dispense Refill  . aspirin EC 81 MG tablet Take 81 mg by mouth daily.    . Cholecalciferol (VITAMIN D) 2000 UNITS tablet Take 2,000 Units by mouth daily.    . clonazePAM (KLONOPIN) 1 MG tablet Take 1 mg by mouth at bedtime.  1  . doxycycline (MONODOX) 100 MG capsule Take 100 mg by mouth at bedtime.     Marland Kitchen loratadine (CLARITIN) 10 MG tablet Take 10 mg by mouth every evening.     . metoprolol tartrate (LOPRESSOR) 25 MG tablet Take 0.5 tablets (12.5 mg total) by mouth 2 (two) times daily. 30 tablet 5  . nitroGLYCERIN (NITROSTAT) 0.4 MG SL tablet Place 0.4 mg under the tongue every 5 (five) minutes as needed (Esophageal spasms).     . pantoprazole (PROTONIX) 40 MG tablet Take 40 mg by mouth daily.    . sertraline (ZOLOFT) 100 MG tablet Take 50 mg by mouth every morning.     . simvastatin (ZOCOR) 40 MG tablet Take 20 mg by mouth at bedtime.     . valACYclovir (VALTREX) 1000 MG tablet Take 1 g by mouth daily as needed (fever blisters).      No current facility-administered medications for this visit.  Allergies  Allergen Reactions  . Erythromycin Base Other (See Comments)    Stomach ache    Review of Systems negative except from HPI and PMH  Physical Exam BP 110/70 mmHg  Pulse 58  Ht 5' 2.5" (1.588 m)  Wt 152 lb 9.6 oz (69.219 kg)  BMI 27.45 kg/m2 Well developed and well nourished in no acute distress HENT normal E scleral and icterus clear Neck Supple JVP flat; carotids brisk and full Clear to ausculation {Regular rate and rhythm, no murmurs gallops or rub Soft with active bowel sounds No clubbing cyanosis  Edema Alert and oriented, grossly normal motor and sensory function Skin Warm and Dry    Assessment and  Plan  SVT--recurrent with symptoms  and history suggestive of AV reentry. This juncture she would like to proceed with catheter ablation. We will arrange this with Dr. Elliot Cousin  Procedure was reviewed. The outcomes and risk  For now, we will continue her on beta blockers.

## 2015-05-23 ENCOUNTER — Encounter: Payer: Self-pay | Admitting: Internal Medicine

## 2015-05-23 ENCOUNTER — Ambulatory Visit (INDEPENDENT_AMBULATORY_CARE_PROVIDER_SITE_OTHER): Payer: BLUE CROSS/BLUE SHIELD | Admitting: Internal Medicine

## 2015-05-23 ENCOUNTER — Encounter: Payer: Self-pay | Admitting: *Deleted

## 2015-05-23 VITALS — BP 110/78 | HR 56 | Ht 62.5 in | Wt 154.0 lb

## 2015-05-23 DIAGNOSIS — E7800 Pure hypercholesterolemia, unspecified: Secondary | ICD-10-CM

## 2015-05-23 DIAGNOSIS — I471 Supraventricular tachycardia: Secondary | ICD-10-CM | POA: Diagnosis not present

## 2015-05-23 DIAGNOSIS — F411 Generalized anxiety disorder: Secondary | ICD-10-CM | POA: Diagnosis not present

## 2015-05-23 LAB — BASIC METABOLIC PANEL
BUN: 9 mg/dL (ref 6–23)
CO2: 30 mEq/L (ref 19–32)
Calcium: 9.5 mg/dL (ref 8.4–10.5)
Chloride: 99 mEq/L (ref 96–112)
Creatinine, Ser: 0.73 mg/dL (ref 0.40–1.20)
GFR: 85.88 mL/min (ref >60.00)
Glucose, Bld: 87 mg/dL (ref 70–99)
Potassium: 3.8 mEq/L (ref 3.5–5.1)
Sodium: 134 mEq/L — ABNORMAL LOW (ref 135–145)

## 2015-05-23 LAB — CBC WITH DIFFERENTIAL/PLATELET
Basophils Absolute: 0 10*3/uL (ref 0.0–0.1)
Basophils Relative: 0.6 % (ref 0.0–3.0)
Eosinophils Absolute: 0.1 10*3/uL (ref 0.0–0.7)
Eosinophils Relative: 1.5 % (ref 0.0–5.0)
HCT: 42.9 % (ref 36.0–46.0)
Hemoglobin: 14.6 g/dL (ref 12.0–15.0)
Lymphocytes Relative: 29.8 % (ref 12.0–46.0)
Lymphs Abs: 1.7 10*3/uL (ref 0.7–4.0)
MCHC: 34.1 g/dL (ref 30.0–36.0)
MCV: 96.7 fl (ref 78.0–100.0)
Monocytes Absolute: 0.4 10*3/uL (ref 0.1–1.0)
Monocytes Relative: 7.5 % (ref 3.0–12.0)
Neutro Abs: 3.4 10*3/uL (ref 1.4–7.7)
Neutrophils Relative %: 60.6 % (ref 43.0–77.0)
Platelets: 277 10*3/uL (ref 150.0–400.0)
RBC: 4.44 Mil/uL (ref 3.87–5.11)
RDW: 13.7 % (ref 11.5–15.5)
WBC: 5.6 10*3/uL (ref 4.0–10.5)

## 2015-05-23 LAB — PROTIME-INR
INR: 1 ratio (ref 0.8–1.0)
Prothrombin Time: 11.1 s (ref 9.6–13.1)

## 2015-05-23 NOTE — Assessment & Plan Note (Signed)
She is encouraged to lose weight and reduce her fat intake.

## 2015-05-23 NOTE — Assessment & Plan Note (Signed)
I have discussed the treatment options with the patient. The risks/benefits/goals/expectations of catheter ablation of SVT have been reviewed with the patient and she wishes to proceed. Will schedule in the coming weeks. She will hold her beta blocker for 2 days prior to the procedure.

## 2015-05-23 NOTE — Assessment & Plan Note (Signed)
Some of her symptoms are exacerbated by her SVT. Will follow.

## 2015-05-23 NOTE — Progress Notes (Signed)
HPI Sheri Arellano is referred today by Dr. Caryl Comes for evaluation and treatment of SVT. She notes SVT dating back over 30 years. The episodes have waxed and waned in frequency and severity but have recently worsened. She has had documented SVT at rates of over 220/min which have been treated with variable success with IV adenosine as well as vagal maneuvers. In SVT she feels palpitations, dizziness but no syncope, and sob. She denies dietary indiscretion with caffeine or ETOH. She has been treated with medical therapy with metoprolol but had recurrent symptoms and has been unable to tolerated increasing doses. Allergies  Allergen Reactions  . Erythromycin Base Other (See Comments)    Stomach ache     Current Outpatient Prescriptions  Medication Sig Dispense Refill  . aspirin EC 81 MG tablet Take 81 mg by mouth daily.    . Cholecalciferol (VITAMIN D) 2000 UNITS tablet Take 2,000 Units by mouth daily.    . clonazePAM (KLONOPIN) 1 MG tablet Take 1 mg by mouth at bedtime.  1  . doxycycline (MONODOX) 100 MG capsule Take 100 mg by mouth at bedtime.     Marland Kitchen loratadine (CLARITIN) 10 MG tablet Take 10 mg by mouth every evening.     . metoprolol tartrate (LOPRESSOR) 25 MG tablet Take 1 tablet (25 mg total) by mouth 2 (two) times daily. 60 tablet 2  . nitroGLYCERIN (NITROSTAT) 0.4 MG SL tablet Place 0.4 mg under the tongue every 5 (five) minutes as needed (Esophageal spasms).     . pantoprazole (PROTONIX) 40 MG tablet Take 40 mg by mouth daily.    . sertraline (ZOLOFT) 100 MG tablet Take 50 mg by mouth every morning.     . simvastatin (ZOCOR) 40 MG tablet Take 20 mg by mouth at bedtime.     . valACYclovir (VALTREX) 1000 MG tablet Take 1 g by mouth daily as needed (fever blisters).      No current facility-administered medications for this visit.     Past Medical History  Diagnosis Date  . Esophageal dysmotility   . Anxiety     probable panic attacks  . Insomnia   . GERD (gastroesophageal  reflux disease)   . Hyperlipidemia   . Dysphagia   . Vitamin D deficiency   . Other malaise and fatigue   . SVT (supraventricular tachycardia)     documented-takes metoprolol as needed  . H/O hiatal hernia   . Cancer     breast  . Radiation 06/05/14-07/18/14    left breast 50.4 gray, lumpectomy cavity boosted to 60.4 gray  . Iron deficiency anemia     hx    ROS:   All systems reviewed and negative except as noted in the HPI.   Past Surgical History  Procedure Laterality Date  . Carpal tunnel release      lt and rt  . Cholecystectomy  1999  . Partial mastectomy with needle localization and axillary sentinel lymph node bx Left 01/16/2014    Procedure: PARTIAL MASTECTOMY WITH NEEDLE LOCALIZATION AND AXILLARY SENTINEL LYMPH NODE BIOPSY;  Surgeon: [name], MD;  Location: [place];  Service: General;  Laterality: Left;  . Portacath placement Right 02/28/2014    Procedure: INSERTION PORT-A-CATH;  Surgeon: [name], MD;  Location: [place];  Service: General;  Laterality: Right;  . Port-a-cath removal Right 08/23/2014    Procedure: MINOR REMOVAL PORT-A-CATH;  Surgeon: Fanny Skates, MD;  Location: [place];  Service: General;  Laterality: Right;  . Colonoscopy with propofol N/A 03/20/2015    Procedure: COLONOSCOPY WITH PROPOFOL;  Surgeon: Garlan Fair, MD;  Location: [facility];  Service: Endoscopy;  Laterality: N/A;     Family History  Problem Relation Age of Onset  . Atrial fibrillation Mother   . Hyperlipidemia Mother   . Hypertension Mother   . Cancer Father     bladder  . Cancer Maternal Aunt     breast     History   Social History  . Marital Status: Married    Spouse Name: N/A  . Number of Children: 2  . Years of Education: N/A   Occupational History  . realtor    Social History Main Topics  . Smoking status: Former Smoker -- 1.50 packs/day for 8 years    Types: Cigarettes    Quit date: 01/22/1976  .  Smokeless tobacco: Never Used  . Alcohol Use: 4.2 oz/week    7 Shots of liquor per week     Comment: 1 vodka per day  . Drug Use: No  . Sexual Activity: Yes    Birth Control/ Protection: Post-menopausal   Other Topics Concern  . Not on file   Social History Narrative     BP 110/78 mmHg  Pulse 56  Ht 5' 2.5" (1.588 m)  Wt 154 lb (69.854 kg)  BMI 27.70 kg/m2  Physical Exam:  Well appearing 64 yo woman, NAD HEENT: Unremarkable Neck:  6 cm JVD, no thyromegally Lymphatics:  No adenopathy Back:  No CVA tenderness Lungs:  Clear with no wheezes HEART:  Regular rate rhythm, no murmurs, no rubs, no clicks Abd:  soft, positive bowel sounds, no organomegally, no rebound, no guarding Ext:  2 plus pulses, no edema, no cyanosis, no clubbing Skin:  No rashes no nodules Neuro:  CN II through XII intact, motor grossly intact  EKG - nsr with no ventricular pre-excitation.  Assess/Plan:

## 2015-05-23 NOTE — Patient Instructions (Signed)
Medication Instructions: - no changes  Labwork: - Your physician recommends that you have lab work today: bmp/ cbc/ inr  Procedures/Testing: - Your physician has recommended that you have an ablation. Catheter ablation is a medical procedure used to treat some cardiac arrhythmias (irregular heartbeats). During catheter ablation, a long, thin, flexible tube is put into a blood vessel in your groin (upper thigh), or neck. This tube is called an ablation catheter. It is then guided to your heart through the blood vessel. Radio frequency waves destroy small areas of heart tissue where abnormal heartbeats may cause an arrhythmia to start. Please see the instruction sheet given to you today.  Follow-Up: - will be scheduled at discharge from the hospital  Any Additional Special Instructions Will Be Listed Below (If Applicable). - none    Cardiac Ablation Cardiac ablation is a procedure to disable a small amount of heart tissue in very specific places. The heart has many electrical connections. Sometimes these connections are abnormal and can cause the heart to beat very fast or irregularly. By disabling some of the problem areas, heart rhythm can be improved or made normal. Ablation is done for people who:   Have Wolff-Parkinson-White syndrome.   Have other fast heart rhythms (tachycardia).   Have taken medicines for an abnormal heart rhythm (arrhythmia) that resulted in:   No success.   Side effects.   May have a high-risk heartbeat that could result in death.  LET [facility] CARE PROVIDER KNOW ABOUT:   Any allergies you have or any previous reactions you have had to X-ray dye, food (such as seafood), medicine, or tape.   All medicines you are taking, including vitamins, herbs, eye drops, creams, and over-the-counter medicines.   Previous problems you or members of your family have had with the use of anesthetics.   Any blood disorders you have.   Previous surgeries or  procedures (such as a kidney transplant) you have had.   Medical conditions you have (such as kidney failure).  RISKS AND COMPLICATIONS Generally, cardiac ablation is a safe procedure. However, problems can occur and include:   Increased risk of cancer. Depending on how long it takes to do the ablation, the dose of radiation can be high.  Bruising and bleeding where a thin, flexible tube (catheter) was inserted during the procedure.   Bleeding into the chest, especially into the sac that surrounds the heart (serious).  Need for a permanent pacemaker if the normal electrical system is damaged.   The procedure may not be fully effective, and this may not be recognized for months. Repeat ablation procedures are sometimes required. BEFORE THE PROCEDURE   Follow any instructions from your health care provider regarding eating and drinking before the procedure.   Take your medicines as directed at regular times with water, unless instructed otherwise by your health care provider. If you are taking diabetes medicine, including insulin, ask how you are to take it and if there are any special instructions you should follow. It is common to adjust insulin dosing the day of the ablation.  PROCEDURE  An ablation is usually performed in a catheterization laboratory with the guidance of fluoroscopy. Fluoroscopy is a type of X-ray that helps your health care provider see images of your heart during the procedure.   An ablation is a minimally invasive procedure. This means a small cut (incision) is made in either your neck or groin. Your health care provider will decide where to make the incision based on  your medical history and physical exam.  An IV tube will be started before the procedure begins. You will be given an anesthetic or medicine to help you relax (sedative).  The skin on your neck or groin will be numbed. A needle will be inserted into a large vein in your neck or groin and  catheters will be threaded to your heart.  A special dye that shows up on fluoroscopy pictures may be injected through the catheter. The dye helps your health care provider see the area of the heart that needs treatment.  The catheter has electrodes on the tip. When the area of heart tissue that is causing the arrhythmia is found, the catheter tip will send an electrical current to the area and "scar" the tissue. Three types of energy can be used to ablate the heart tissue:   Heat (radiofrequency energy).   Laser energy.   Extreme cold (cryoablation).   When the area of the heart has been ablated, the catheter will be taken out. Pressure will be held on the insertion site. This will help the insertion site clot and keep it from bleeding. A bandage will be placed on the insertion site.  AFTER THE PROCEDURE   After the procedure, you will be taken to a recovery area where your vital signs (blood pressure, heart rate, and breathing) will be monitored. The insertion site will also be monitored for bleeding.   You will need to lie still for 4-6 hours. This is to ensure you do not bleed from the catheter insertion site.  Document Released: 04/12/2009 Document Revised: 04/10/2014 Document Reviewed: 04/18/2013 [facility] Patient Information 2015 [place], [place]. This information is not intended to replace advice given to you by your health care provider. Make sure you discuss any questions you have with your health care provider.

## 2015-05-25 ENCOUNTER — Other Ambulatory Visit: Payer: Self-pay | Admitting: *Deleted

## 2015-05-30 ENCOUNTER — Ambulatory Visit (HOSPITAL_COMMUNITY)
Admission: RE | Admit: 2015-05-30 | Discharge: 2015-05-31 | Disposition: A | Discharge status: Home / Self Care | Payer: BLUE CROSS/BLUE SHIELD | Source: Ambulatory Visit | Attending: Internal Medicine | Admitting: Internal Medicine

## 2015-05-30 ENCOUNTER — Encounter (HOSPITAL_COMMUNITY)
Admission: RE | Disposition: A | Discharge status: Home / Self Care | Payer: BLUE CROSS/BLUE SHIELD | Source: Ambulatory Visit | Attending: Internal Medicine

## 2015-05-30 ENCOUNTER — Encounter (HOSPITAL_COMMUNITY): Payer: Self-pay | Admitting: *Deleted

## 2015-05-30 DIAGNOSIS — Z853 Personal history of malignant neoplasm of breast: Secondary | ICD-10-CM | POA: Diagnosis not present

## 2015-05-30 DIAGNOSIS — I471 Supraventricular tachycardia, unspecified: Secondary | ICD-10-CM | POA: Diagnosis present

## 2015-05-30 DIAGNOSIS — K219 Gastro-esophageal reflux disease without esophagitis: Secondary | ICD-10-CM | POA: Insufficient documentation

## 2015-05-30 DIAGNOSIS — Z7982 Long term (current) use of aspirin: Secondary | ICD-10-CM | POA: Insufficient documentation

## 2015-05-30 DIAGNOSIS — Z87891 Personal history of nicotine dependence: Secondary | ICD-10-CM | POA: Diagnosis not present

## 2015-05-30 DIAGNOSIS — K224 Dyskinesia of esophagus: Secondary | ICD-10-CM | POA: Diagnosis not present

## 2015-05-30 DIAGNOSIS — E785 Hyperlipidemia, unspecified: Secondary | ICD-10-CM | POA: Insufficient documentation

## 2015-05-30 HISTORY — PX: ELECTROPHYSIOLOGIC STUDY: SHX172A

## 2015-05-30 SURGERY — A-FLUTTER/A-TACH/SVT ABLATION

## 2015-05-30 MED ORDER — FENTANYL CITRATE (PF) 100 MCG/2ML IJ SOLN
INTRAMUSCULAR | Status: DC | PRN
  Administered 2015-05-30 (×6): 25 ug via INTRAVENOUS

## 2015-05-30 MED ORDER — SODIUM CHLORIDE 0.9 % IV SOLN
2.0000 ug/min | INTRAVENOUS | Status: DC
  Filled 2015-05-30: qty 2

## 2015-05-30 MED ORDER — SERTRALINE HCL 50 MG PO TABS
50.0000 mg | ORAL_TABLET | Freq: Every morning | ORAL | Status: DC
  Filled 2015-05-30: qty 1

## 2015-05-30 MED ORDER — SODIUM CHLORIDE 0.9 % IJ SOLN: 3.0000 mL | INTRAMUSCULAR | Status: DC | PRN

## 2015-05-30 MED ORDER — SODIUM CHLORIDE 0.9 % IV SOLN: 250.0000 mL | INTRAVENOUS | Status: DC | PRN

## 2015-05-30 MED ORDER — FENTANYL CITRATE (PF) 100 MCG/2ML IJ SOLN
INTRAMUSCULAR | Status: AC
  Filled 2015-05-30: qty 2

## 2015-05-30 MED ORDER — ISOPROTERENOL HCL 0.2 MG/ML IJ SOLN
1.0000 mg | INTRAVENOUS | Status: DC | PRN
  Administered 2015-05-30: 4 ug/min via INTRAVENOUS

## 2015-05-30 MED ORDER — CLONAZEPAM 0.5 MG PO TABS
1.0000 mg | ORAL_TABLET | Freq: Every day | ORAL | Status: DC
  Administered 2015-05-30: 22:00:00 1 mg via ORAL
  Filled 2015-05-30: qty 2

## 2015-05-30 MED ORDER — BUPIVACAINE HCL (PF) 0.25 % IJ SOLN
INTRAMUSCULAR | Status: AC
  Filled 2015-05-30: qty 30

## 2015-05-30 MED ORDER — HEPARIN (PORCINE) IN NACL 2-0.9 UNIT/ML-% IJ SOLN
INTRAMUSCULAR | Status: AC
  Filled 2015-05-30: qty 500

## 2015-05-30 MED ORDER — ANGIOPLASTY BOOK
Freq: Once | Status: DC
  Filled 2015-05-30: qty 1

## 2015-05-30 MED ORDER — SODIUM CHLORIDE 0.9 % IJ SOLN: 3.0000 mL | Freq: Two times a day (BID) | INTRAMUSCULAR | Status: DC

## 2015-05-30 MED ORDER — OFF THE BEAT BOOK
Freq: Once | Status: AC
  Administered 2015-05-30: 20:00:00
  Filled 2015-05-30: qty 1

## 2015-05-30 MED ORDER — MIDAZOLAM HCL 5 MG/5ML IJ SOLN
INTRAMUSCULAR | Status: DC | PRN
  Administered 2015-05-30 (×7): 2 mg via INTRAVENOUS

## 2015-05-30 MED ORDER — MIDAZOLAM HCL 5 MG/5ML IJ SOLN
INTRAMUSCULAR | Status: AC
  Filled 2015-05-30: qty 5

## 2015-05-30 MED ORDER — ONDANSETRON HCL 4 MG/2ML IJ SOLN: 4.0000 mg | Freq: Four times a day (QID) | INTRAMUSCULAR | Status: DC | PRN

## 2015-05-30 MED ORDER — ACETAMINOPHEN 325 MG PO TABS: 650.0000 mg | ORAL_TABLET | ORAL | Status: DC | PRN

## 2015-05-30 MED ORDER — BUPIVACAINE HCL (PF) 0.25 % IJ SOLN
INTRAMUSCULAR | Status: DC | PRN
  Administered 2015-05-30: 60 mL

## 2015-05-30 SURGICAL SUPPLY — 11 items
BAG SNAP BAND KOVER 36X36 (MISCELLANEOUS) ×1 IMPLANT
CATH CELSIUS THERM B CV 7F (ABLATOR) ×1 IMPLANT
CATH HEX JOSEPH 2-5-2 65CM 6F (CATHETERS) ×1 IMPLANT
CATH JOSEPHSON QUAD-ALLRED 6FR (CATHETERS) ×2 IMPLANT
PACK EP LATEX FREE (CUSTOM PROCEDURE TRAY) ×2
PACK EP LF (CUSTOM PROCEDURE TRAY) IMPLANT
PAD DEFIB LIFELINK (PAD) ×1 IMPLANT
SHEATH PINNACLE 6F 10CM (SHEATH) ×2 IMPLANT
SHEATH PINNACLE 7F 10CM (SHEATH) ×1 IMPLANT
SHEATH PINNACLE 8F 10CM (SHEATH) ×1 IMPLANT
SHIELD RADPAD SCOOP 12X17 (MISCELLANEOUS) ×1 IMPLANT

## 2015-05-30 NOTE — Interval H&P Note (Signed)
History and Physical Interval Note:  05/30/2015 2:12 PM  Wilsey  has presented today for surgery, with the diagnosis of svt  The various methods of treatment have been discussed with the patient and family. After consideration of risks, benefits and other options for treatment, the patient has consented to  Procedure(s): SVT Ablation (N/A) as a surgical intervention .  The patient's history has been reviewed, patient examined, no change in status, stable for surgery.  I have reviewed the patient's chart and labs.  Questions were answered to the patient's satisfaction.     Mikle Bosworth.D.

## 2015-05-30 NOTE — Progress Notes (Signed)
Site area: rt groin 3 venous sheaths Site Prior to Removal:  Level   0 Pressure Applied For:  15 minutes Manual:   yes Patient Status During Pull:  stable Post Pull Site:  Level  0 Post Pull Instructions Given:  yes Post Pull Pulses Present: yes Dressing Applied:  Small tegaderm Bedrest begins @  0177 Comments:  none

## 2015-05-30 NOTE — H&P (View-Only) (Signed)
HPI [name] is referred today by Dr. Caryl Comes for evaluation and treatment of SVT. She notes SVT dating back over 30 years. The episodes have waxed and waned in frequency and severity but have recently worsened. She has had documented SVT at rates of over 220/min which have been treated with variable success with IV adenosine as well as vagal maneuvers. In SVT she feels palpitations, dizziness but no syncope, and sob. She denies dietary indiscretion with caffeine or ETOH. She has been treated with medical therapy with metoprolol but had recurrent symptoms and has been unable to tolerated increasing doses. Allergies  Allergen Reactions  . Erythromycin Base Other (See Comments)    Stomach ache     Current Outpatient Prescriptions  Medication Sig Dispense Refill  . aspirin EC 81 MG tablet Take 81 mg by mouth daily.    . Cholecalciferol (VITAMIN D) 2000 UNITS tablet Take 2,000 Units by mouth daily.    . clonazePAM (KLONOPIN) 1 MG tablet Take 1 mg by mouth at bedtime.  1  . doxycycline (MONODOX) 100 MG capsule Take 100 mg by mouth at bedtime.     Marland Kitchen loratadine (CLARITIN) 10 MG tablet Take 10 mg by mouth every evening.     . metoprolol tartrate (LOPRESSOR) 25 MG tablet Take 1 tablet (25 mg total) by mouth 2 (two) times daily. 60 tablet 2  . nitroGLYCERIN (NITROSTAT) 0.4 MG SL tablet Place 0.4 mg under the tongue every 5 (five) minutes as needed (Esophageal spasms).     . pantoprazole (PROTONIX) 40 MG tablet Take 40 mg by mouth daily.    . sertraline (ZOLOFT) 100 MG tablet Take 50 mg by mouth every morning.     . simvastatin (ZOCOR) 40 MG tablet Take 20 mg by mouth at bedtime.     . valACYclovir (VALTREX) 1000 MG tablet Take 1 g by mouth daily as needed (fever blisters).      No current facility-administered medications for this visit.     Past Medical History  Diagnosis Date  . Esophageal dysmotility   . Anxiety     probable panic attacks  . Insomnia   . GERD (gastroesophageal  reflux disease)   . Hyperlipidemia   . Dysphagia   . Vitamin D deficiency   . Other malaise and fatigue   . SVT (supraventricular tachycardia)     documented-takes metoprolol as needed  . H/O hiatal hernia   . Cancer     breast  . Radiation 06/05/14-07/18/14    left breast 50.4 gray, lumpectomy cavity boosted to 60.4 gray  . Iron deficiency anemia     hx    ROS:   All systems reviewed and negative except as noted in the HPI.   Past Surgical History  Procedure Laterality Date  . Carpal tunnel release      lt and rt  . Cholecystectomy  1999  . Partial mastectomy with needle localization and axillary sentinel lymph node bx Left 01/16/2014    Procedure: PARTIAL MASTECTOMY WITH NEEDLE LOCALIZATION AND AXILLARY SENTINEL LYMPH NODE BIOPSY;  Surgeon: [name], MD;  Location: [place];  Service: General;  Laterality: Left;  . Portacath placement Right 02/28/2014    Procedure: INSERTION PORT-A-CATH;  Surgeon: [name], MD;  Location: [place];  Service: General;  Laterality: Right;  . Port-a-cath removal Right 08/23/2014    Procedure: MINOR REMOVAL PORT-A-CATH;  Surgeon: Fanny Skates, MD;  Location: [place];  Service: General;  Laterality: Right;  . Colonoscopy with propofol N/A 03/20/2015    Procedure: COLONOSCOPY WITH PROPOFOL;  Surgeon: Garlan Fair, MD;  Location: [facility];  Service: Endoscopy;  Laterality: N/A;     Family History  Problem Relation Age of Onset  . Atrial fibrillation Mother   . Hyperlipidemia Mother   . Hypertension Mother   . Cancer Father     bladder  . Cancer Maternal Aunt     breast     History   Social History  . Marital Status: Married    Spouse Name: N/A  . Number of Children: 2  . Years of Education: N/A   Occupational History  . realtor    Social History Main Topics  . Smoking status: Former Smoker -- 1.50 packs/day for 8 years    Types: Cigarettes    Quit date: 01/22/1976  .  Smokeless tobacco: Never Used  . Alcohol Use: 4.2 oz/week    7 Shots of liquor per week     Comment: 1 vodka per day  . Drug Use: No  . Sexual Activity: Yes    Birth Control/ Protection: Post-menopausal   Other Topics Concern  . Not on file   Social History Narrative     BP 110/78 mmHg  Pulse 56  Ht 5' 2.5" (1.588 m)  Wt 154 lb (69.854 kg)  BMI 27.70 kg/m2  Physical Exam:  Well appearing 64 yo woman, NAD HEENT: Unremarkable Neck:  6 cm JVD, no thyromegally Lymphatics:  No adenopathy Back:  No CVA tenderness Lungs:  Clear with no wheezes HEART:  Regular rate rhythm, no murmurs, no rubs, no clicks Abd:  soft, positive bowel sounds, no organomegally, no rebound, no guarding Ext:  2 plus pulses, no edema, no cyanosis, no clubbing Skin:  No rashes no nodules Neuro:  CN II through XII intact, motor grossly intact  EKG - nsr with no ventricular pre-excitation.  Assess/Plan:

## 2015-05-30 NOTE — Progress Notes (Signed)
Site area: rt IJ venous sheath pulled by [facility] Site Prior to Removal:  Level  0 Pressure Applied For: 10 minutes Manual:   yes Patient Status During Pull:  stable Post Pull Site:  Level  0 Post Pull Instructions Given:  yes Post Pull Pulses Present: na Dressing Applied:  Small tegaderm Bedrest begins @  Comments:

## 2015-05-31 ENCOUNTER — Encounter (HOSPITAL_COMMUNITY): Payer: Self-pay | Admitting: Internal Medicine

## 2015-05-31 DIAGNOSIS — Z87891 Personal history of nicotine dependence: Secondary | ICD-10-CM | POA: Diagnosis not present

## 2015-05-31 DIAGNOSIS — E785 Hyperlipidemia, unspecified: Secondary | ICD-10-CM | POA: Diagnosis not present

## 2015-05-31 DIAGNOSIS — I471 Supraventricular tachycardia: Secondary | ICD-10-CM

## 2015-05-31 DIAGNOSIS — Z7982 Long term (current) use of aspirin: Secondary | ICD-10-CM | POA: Diagnosis not present

## 2015-05-31 MED FILL — Heparin Sodium (Porcine) 2 Unit/ML in Sodium Chloride 0.9%: INTRAMUSCULAR | Qty: 500 | Status: AC

## 2015-05-31 NOTE — Discharge Instructions (Signed)
No driving for 2 days. No lifting over 5 lbs for 1 week. No sexual activity for 1 week. Keep procedure site clean & dry. If you notice increased pain, swelling, bleeding or pus, call/return!  You may shower, but no soaking baths/hot tubs/pools for 1 week.   

## 2015-05-31 NOTE — Discharge Summary (Signed)
ELECTROPHYSIOLOGY PROCEDURE DISCHARGE SUMMARY    Patient ID: Sheri Arellano,  MRN: [id number], DOB/AGE: [birth year] 64 y.o.  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  Hyperlipidemia 2.  Prior breast cancer 3.  GERD 4.  Esophageal dysmotility  Allergies  Allergen Reactions  . Erythromycin Base Other (See Comments)    Stomach ache     Procedures This Admission: 1.  Electrophysiology study and radiofrequency catheter ablation on 05/30/15 by Dr Sheri Arellano.  This study demonstrated inducible AVNRT with successful slow pathway modification.  There were no inducible arrhythmias following ablation and no early apparent complications.   Brief HPI: Sheri Arellano is a 64 y.o. female with a past medical history as outlined above.  She has had increasing tachypalpitations with documented SVT.  They have failed medical therapy with Lopressor.  Risks, benefits, and alternatives to ablation were reviewed with the patient who wished to proceed.   Hospital Course:  The patient was admitted and underwent EPS/RFCA with details as outlined above. She was monitored on telemetry overnight which demonstrated sinus rhythm.  Groin and neck incisions were without complication.  They were examined and considered stable for discharge to home.  Follow up will be arranged in 4 weeks.  Wound care and restrictions were reviewed with the patient prior to discharge.   Physical Exam: Filed Vitals:   05/30/15 2000 05/30/15 2130 05/31/15 0016 05/31/15 0559  BP: 116/73 113/71 100/52 106/57  Pulse: 78 70 66 63  Temp:  97.4 F (36.3 C) 98 F (36.7 C) 98.4 F (36.9 C)  TempSrc:  Oral Oral Oral  Resp: 15 17 20 20   Height:      Weight:   153 lb 7 oz (69.6 kg)   SpO2: 97% 98% 96% 94%    GEN-  The patient is well appearing, alert and oriented x 3 today.   HEENT: normocephalic, atraumatic; sclera clear, conjunctiva pink; hearing intact; oropharynx clear; neck supple, no JVP Neck- no hematoma Lungs- Clear to ausculation bilaterally, normal work of breathing.  No wheezes, rales, rhonchi Heart- Regular rate and rhythm, no murmurs, rubs or gallops  GI- soft, non-tender, non-distended, bowel sounds present  Extremities- no clubbing, cyanosis, or edema; DP/PT/radial pulses 2+ bilaterally, groin without hematoma/bruit MS- no significant deformity or atrophy Skin- warm and dry, no rash or lesion Psych- euthymic mood, full affect Neuro- strength and sensation are intact   Discharge Vitals: Blood pressure 106/57, pulse 63, temperature 98.4 F (36.9 C), temperature source Oral, resp. rate 20, height 5' 2.5" (1.588 m), weight 153 lb 7 oz (69.6 kg), SpO2 94 %.   Labs:   Lab Results  Component Value Date   WBC 5.6 05/23/2015   HGB 14.6 05/23/2015   HCT 42.9 05/23/2015   MCV 96.7 05/23/2015   PLT 277.0 05/23/2015   No results for input(s): NA, K, CL, CO2, BUN, CREATININE, CALCIUM, PROT, BILITOT, ALKPHOS, ALT, AST, GLUCOSE in the last 168 hours.  Invalid input(s): LABALBU  Discharge Medications:    Medication List    STOP taking these medications        metoprolol tartrate 25 MG tablet  Commonly known as:  LOPRESSOR      TAKE these medications        aspirin EC 81 MG tablet  Take 81 mg by mouth daily.  clonazePAM 1 MG tablet  Commonly known as:  KLONOPIN  Take 1 mg by mouth at bedtime.     doxycycline 100 MG capsule  Commonly known as:  MONODOX  Take 100 mg by mouth at bedtime.     loratadine 10 MG tablet  Commonly known as:  CLARITIN  Take 10 mg by mouth every evening.     nitroGLYCERIN 0.4 MG SL tablet  Commonly known as:  NITROSTAT  Place 0.4 mg under the tongue every 5 (five) minutes as needed (Esophageal spasms).     pantoprazole 40 MG tablet  Commonly  known as:  PROTONIX  Take 40 mg by mouth daily.     sertraline 100 MG tablet  Commonly known as:  ZOLOFT  Take 50 mg by mouth every morning.     simvastatin 40 MG tablet  Commonly known as:  ZOCOR  Take 20 mg by mouth at bedtime.     valACYclovir 1000 MG tablet  Commonly known as:  VALTREX  Take 1 g by mouth daily as needed (fever blisters).     Vitamin D 2000 UNITS tablet  Take 2,000 Units by mouth daily.        Disposition:  Discharge Instructions    Diet - low sodium heart healthy    Complete by:  As directed      Increase activity slowly    Complete by:  As directed           Follow-up Information    Follow up with Sheri Peru, MD On 06/29/2015.   Specialty:  Cardiology   Why:  at 3:30PM   Contact information:   [address] [address] [phone]       Duration of Discharge Encounter: Greater than 30 minutes including physician time.  Signed, Sheri Marshall, NP 05/31/2015 7:33 AM  EP Attending  Patient seen and examined. Agree with above with minimal modification. [place] for discharge with usual followup.  Sheri Arellano.D.

## 2015-06-01 ENCOUNTER — Telehealth: Payer: Self-pay | Admitting: Internal Medicine

## 2015-06-01 NOTE — Telephone Encounter (Signed)
New Prob    Pt has some questions regarding wound care after ablation. Please call.

## 2015-06-01 NOTE — Telephone Encounter (Signed)
Spoke with patient who states she was instructed to remove bandage before shower but was unsure as to whether and how long she is to keep the site covered.  I advised her to keep it covered until the site is closed and to watch for foul or discolored drainage, redness, or bleeding.  Patient verbalized understanding and agreement.

## 2015-06-29 ENCOUNTER — Encounter: Payer: BLUE CROSS/BLUE SHIELD | Admitting: Internal Medicine

## 2015-07-18 ENCOUNTER — Other Ambulatory Visit (HOSPITAL_BASED_OUTPATIENT_CLINIC_OR_DEPARTMENT_OTHER): Payer: BLUE CROSS/BLUE SHIELD

## 2015-07-18 DIAGNOSIS — C50412 Malignant neoplasm of upper-outer quadrant of left female breast: Secondary | ICD-10-CM | POA: Diagnosis not present

## 2015-07-18 LAB — CBC WITH DIFFERENTIAL/PLATELET
BASO%: 0.8 % (ref 0.0–2.0)
Basophils Absolute: 0 10*3/uL (ref 0.0–0.1)
EOS%: 1.1 % (ref 0.0–7.0)
Eosinophils Absolute: 0.1 10*3/uL (ref 0.0–0.5)
HCT: 40.6 % (ref 34.8–46.6)
HGB: 14.3 g/dL (ref 11.6–15.9)
LYMPH%: 28.5 % (ref 14.0–49.7)
MCH: 32.8 pg (ref 25.1–34.0)
MCHC: 35.1 g/dL (ref 31.5–36.0)
MCV: 93.4 fL (ref 79.5–101.0)
MONO#: 0.3 10*3/uL (ref 0.1–0.9)
MONO%: 6.8 % (ref 0.0–14.0)
NEUT#: 3.2 10*3/uL (ref 1.5–6.5)
NEUT%: 62.8 % (ref 38.4–76.8)
Platelets: 250 10*3/uL (ref 145–400)
RBC: 4.35 10*6/uL (ref 3.70–5.45)
RDW: 13.2 % (ref 11.2–14.5)
WBC: 5.1 10*3/uL (ref 3.9–10.3)
lymph#: 1.5 10*3/uL (ref 0.9–3.3)

## 2015-07-25 ENCOUNTER — Telehealth: Payer: Self-pay | Admitting: Oncology

## 2015-07-25 ENCOUNTER — Ambulatory Visit (HOSPITAL_BASED_OUTPATIENT_CLINIC_OR_DEPARTMENT_OTHER): Payer: BLUE CROSS/BLUE SHIELD | Admitting: Oncology

## 2015-07-25 VITALS — BP 119/85 | HR 82 | Temp 98.1°F | Resp 17 | Ht 62.5 in | Wt 154.1 lb

## 2015-07-25 DIAGNOSIS — C50412 Malignant neoplasm of upper-outer quadrant of left female breast: Secondary | ICD-10-CM | POA: Diagnosis not present

## 2015-07-25 DIAGNOSIS — Z171 Estrogen receptor negative status [ER-]: Secondary | ICD-10-CM | POA: Diagnosis not present

## 2015-07-25 NOTE — Telephone Encounter (Signed)
Appointments made and avs printed for patient °

## 2015-07-25 NOTE — Progress Notes (Signed)
[place]  Telephone:(336) [phone] Fax:(336) [phone]     ID: Sheri Arellano OB: 1951/11/21  MR#: [id number]  AXK#:[id number]  PCP: [name], MD GYN:  Olena Mater SU: Fanny Skates OTHER MD: Gery Pray, [facility] Marylyn Ishihara  CHIEF COMPLAINT: Early-stage breast cancer  CURRENT THERAPY: Observation  BREAST CANCER HISTORY: From the original intake note:  [name] had routine screening mammography at [facility] 11/21/2013 showing an irregular density in the left breast at the 1:00 position. Ultrasound was obtained 11/29/2013 and showed a 4 mm tolerate them live irregular mass in the left breast at the 2:00 position. This was hypoechoic. Biopsy of this area 12/12/2013 showed (SAA 15-71) an invasive ductal carcinoma, grade 1 or 2, estrogen receptor 0%, progesterone receptor 0%, with no HER-2 amplification, the signals ratio being 1.09 and the number per cell be 1.95. The MIB-1-1 was 72%.  The patient subsequent history is as detailed below  INTERVAL HISTORY: Sheri Arellano returns today for followup of her breast cancer accompanied by her husband Sheri Arellano. The interval history is unremarkable. She continues to work at [employer]. She is not exercising on a regular basis.  REVIEW OF SYSTEMS: Sheri Arellano has occasional "shooting pains" in the surgical breast. This worries her. I reassured her today that these are very common and that they are due to the surgery and not to breast cancer. Otherwise a detailed review of systems today was noncontributory  PAST MEDICAL HISTORY: Past Medical History  Diagnosis Date  . Esophageal dysmotility   . Anxiety     probable panic attacks  . Insomnia   . GERD (gastroesophageal reflux disease)   . Hyperlipidemia   . Dysphagia   . Vitamin D deficiency   . Other malaise and fatigue   . SVT (supraventricular tachycardia)     documented-takes metoprolol as needed  . H/O hiatal hernia   . Cancer     breast  . Radiation  06/05/14-07/18/14    left breast 50.4 gray, lumpectomy cavity boosted to 60.4 gray  . Iron deficiency anemia     hx    PAST SURGICAL HISTORY: Past Surgical History  Procedure Laterality Date  . Carpal tunnel release      lt and rt  . Cholecystectomy  1999  . Partial mastectomy with needle localization and axillary sentinel lymph node bx Left 01/16/2014    Procedure: PARTIAL MASTECTOMY WITH NEEDLE LOCALIZATION AND AXILLARY SENTINEL LYMPH NODE BIOPSY;  Surgeon: [name], MD;  Location: [place];  Service: General;  Laterality: Left;  . Portacath placement Right 02/28/2014    Procedure: INSERTION PORT-A-CATH;  Surgeon: [name], MD;  Location: [place];  Service: General;  Laterality: Right;  . Port-a-cath removal Right 08/23/2014    Procedure: MINOR REMOVAL PORT-A-CATH;  Surgeon: Fanny Skates, MD;  Location: [place];  Service: General;  Laterality: Right;  . Colonoscopy with propofol N/A 03/20/2015    Procedure: COLONOSCOPY WITH PROPOFOL;  Surgeon: Garlan Fair, MD;  Location: [facility];  Service: Endoscopy;  Laterality: N/A;  . Electrophysiologic study N/A 05/30/2015    Procedure: SVT Ablation;  Surgeon: Evans Lance, MD;  Location: [place] [facility];  Service: Cardiovascular;  Laterality: N/A;    FAMILY HISTORY Family History  Problem Relation Age of Onset  . Atrial fibrillation Mother   . Hyperlipidemia Mother   . Hypertension Mother   . Cancer Father     bladder  . Cancer Maternal Aunt  breast   the patient's parents are living, both in their 93s. The patient had one brother and one no sisters. One of the patient's mothers 3 sisters was diagnosed with breast cancer before the age of 22. There is no history of ovarian cancer in the family.  GYNECOLOGIC HISTORY:  (Reviewed 05/02/2014) Menarche age 83, first live birth age 54, the patient is [place] P2. She underwent menopause at age 39, and has been on hormone replacement for the  last 23 years (estradiol and norethindrone most recently). This was stopped 12/17/2012  SOCIAL HISTORY:   (Updated 05/02/2014) [name] works as a Forensic psychologist. Sheri Arellano used to work for [employer] but is currently retired. Their son date lives in [place] where he owns an Electronics engineer and [place] [employer] [place] products. Son Ruby Cola lives in [place], currently working for [employer]. The patient has 3 grandchildren. She is a Furniture conservator/restorer.    ADVANCED DIRECTIVES: In place   HEALTH MAINTENANCE: (Reviewed 05/02/2014) Social History  Substance Use Topics  . Smoking status: Former Smoker -- 1.50 packs/day for 8 years    Types: Cigarettes    Quit date: 01/22/1976  . Smokeless tobacco: Never Used  . Alcohol Use: 4.2 oz/week    7 Shots of liquor per week     Comment: 1 vodka per day     Colonoscopy: Not on file  PAP: March 2014, Dr. Inda Merlin  Bone density: Not on file  Lipid panel: Not on file   Allergies  Allergen Reactions  . Erythromycin Base Other (See Comments)    Stomach ache    Current Outpatient Prescriptions  Medication Sig Dispense Refill  . aspirin EC 81 MG tablet Take 81 mg by mouth daily.    . Cholecalciferol (VITAMIN D) 2000 UNITS tablet Take 2,000 Units by mouth daily.    . clonazePAM (KLONOPIN) 1 MG tablet Take 1 mg by mouth at bedtime.  1  . doxycycline (MONODOX) 100 MG capsule Take 100 mg by mouth at bedtime.     Marland Kitchen loratadine (CLARITIN) 10 MG tablet Take 10 mg by mouth every evening.     . nitroGLYCERIN (NITROSTAT) 0.4 MG SL tablet Place 0.4 mg under the tongue every 5 (five) minutes as needed (Esophageal spasms).     . pantoprazole (PROTONIX) 40 MG tablet Take 40 mg by mouth daily.    . sertraline (ZOLOFT) 100 MG tablet Take 50 mg by mouth every morning.     . simvastatin (ZOCOR) 40 MG tablet Take 20 mg by mouth at bedtime.     . valACYclovir (VALTREX) 1000 MG tablet Take 1 g by mouth daily as needed (fever blisters).      No current  facility-administered medications for this visit.    OBJECTIVE: Middle-aged white woman in no acute distress Filed Vitals:   07/25/15 1205  BP: 119/85  Pulse: 82  Temp: 98.1 F (36.7 C)  Resp: 17     Body mass index is 27.72 kg/(m^2).    ECOG FS: 0 Filed Weights   07/25/15 1205  Weight: 154 lb 1.6 oz (69.899 kg)   Sclerae unicteric, pupils round and equal Oropharynx clear and moist-- no thrush or other lesions No cervical or supraclavicular adenopathy Lungs no rales or rhonchi Heart regular rate and rhythm Abd soft, nontender, positive bowel sounds MSK no focal spinal tenderness, no upper extremity lymphedema Neuro: nonfocal, well oriented, appropriate affect Breasts: The right breast is unremarkable per the left breast is status post lumpectomy and radiation.  There is no evidence of local recurrence. The left axilla is benign.   LAB RESULTS:  Lab Results  Component Value Date   WBC 5.1 07/18/2015   NEUTROABS 3.2 07/18/2015   HGB 14.3 07/18/2015   HCT 40.6 07/18/2015   MCV 93.4 07/18/2015   PLT 250 07/18/2015      Chemistry      Component Value Date/Time   NA 134* 05/23/2015 1257   NA 137 01/29/2015 1012   K 3.8 05/23/2015 1257   K 4.0 01/29/2015 1012   CL 99 05/23/2015 1257   CO2 30 05/23/2015 1257   CO2 26 01/29/2015 1012   BUN 9 05/23/2015 1257   BUN 8.7 01/29/2015 1012   CREATININE 0.73 05/23/2015 1257   CREATININE 0.8 01/29/2015 1012      Component Value Date/Time   CALCIUM 9.5 05/23/2015 1257   CALCIUM 9.5 01/29/2015 1012   ALKPHOS 69 01/29/2015 1012   AST 17 01/29/2015 1012   ALT 18 01/29/2015 1012   BILITOT 0.72 01/29/2015 1012       STUDIES: No results found.   ASSESSMENT: 64 y.o. [place] woman status post left breast biopsy 12/12/2013 for a clinical T1a N0 invasive ductal carcinoma, low to intermediate grade, triple negative, with an MIB-1 of 72%  (1) status post left lumpectomy and sentinel lymph node sampling 01/16/2014 for a pT1c  pN0, stage IA invasive ductal carcinoma, grade 3, repeat prognostic panel again triple negative.  (2)  adjuvant chemotherapy consisting of 4 cycles of docetaxel/cyclophosphamide given on a Q. three-week basis completed 05/15/2014  (3) adjuvant radiation completed 07/18/2014   PLAN: Sheri Arellano is doing very well as far as breast cancer is concerned with no evidence of disease activity.  It would be helpful if she exercises more regularly. She is going to think about the aquatic Center, since she likes to swim. She also has a fit bed, which she has not been wearing. I suggested she make 10,000 steps a day goal.  While she remains a little anxious about the whole cancer question, she does appear much more relaxed than before and is agreeable to being seen every 6 months instead of every 3 months.  Accordingly she will return to see me again in February 2017. She will have her lab work a week before. She knows to call for any problems that may develop before that visit.    Lindaann Slough, MD   07/25/2015 12:22 PM

## 2015-09-11 ENCOUNTER — Other Ambulatory Visit (HOSPITAL_COMMUNITY)
Admission: RE | Admit: 2015-09-11 | Discharge: 2015-09-11 | Disposition: A | Discharge status: Home / Self Care | Payer: BLUE CROSS/BLUE SHIELD | Source: Ambulatory Visit | Attending: Obstetrics and Gynecology | Admitting: Obstetrics and Gynecology

## 2015-09-11 ENCOUNTER — Other Ambulatory Visit: Payer: Self-pay | Admitting: Obstetrics and Gynecology

## 2015-09-11 DIAGNOSIS — Z01419 Encounter for gynecological examination (general) (routine) without abnormal findings: Secondary | ICD-10-CM | POA: Diagnosis present

## 2015-09-13 LAB — CYTOLOGY - PAP

## 2016-01-17 ENCOUNTER — Other Ambulatory Visit (HOSPITAL_BASED_OUTPATIENT_CLINIC_OR_DEPARTMENT_OTHER): Payer: BLUE CROSS/BLUE SHIELD

## 2016-01-17 DIAGNOSIS — C50412 Malignant neoplasm of upper-outer quadrant of left female breast: Secondary | ICD-10-CM | POA: Diagnosis not present

## 2016-01-17 LAB — CBC WITH DIFFERENTIAL/PLATELET
BASO%: 0.6 % (ref 0.0–2.0)
Basophils Absolute: 0 10*3/uL (ref 0.0–0.1)
EOS%: 1.7 % (ref 0.0–7.0)
Eosinophils Absolute: 0.1 10*3/uL (ref 0.0–0.5)
HCT: 40.8 % (ref 34.8–46.6)
HGB: 14.2 g/dL (ref 11.6–15.9)
LYMPH%: 26.6 % (ref 14.0–49.7)
MCH: 32.5 pg (ref 25.1–34.0)
MCHC: 34.8 g/dL (ref 31.5–36.0)
MCV: 93.4 fL (ref 79.5–101.0)
MONO#: 0.4 10*3/uL (ref 0.1–0.9)
MONO%: 7 % (ref 0.0–14.0)
NEUT#: 3.8 10*3/uL (ref 1.5–6.5)
NEUT%: 64.1 % (ref 38.4–76.8)
Platelets: 270 10*3/uL (ref 145–400)
RBC: 4.36 10*6/uL (ref 3.70–5.45)
RDW: 13.2 % (ref 11.2–14.5)
WBC: 5.9 10*3/uL (ref 3.9–10.3)
lymph#: 1.6 10*3/uL (ref 0.9–3.3)

## 2016-01-24 ENCOUNTER — Telehealth: Payer: Self-pay | Admitting: Oncology

## 2016-01-24 ENCOUNTER — Ambulatory Visit (HOSPITAL_BASED_OUTPATIENT_CLINIC_OR_DEPARTMENT_OTHER): Payer: BLUE CROSS/BLUE SHIELD | Admitting: Oncology

## 2016-01-24 VITALS — BP 117/72 | HR 76 | Temp 98.4°F | Resp 18 | Ht 62.5 in | Wt 157.7 lb

## 2016-01-24 DIAGNOSIS — Z171 Estrogen receptor negative status [ER-]: Secondary | ICD-10-CM

## 2016-01-24 DIAGNOSIS — C50912 Malignant neoplasm of unspecified site of left female breast: Secondary | ICD-10-CM

## 2016-01-24 DIAGNOSIS — I4891 Unspecified atrial fibrillation: Secondary | ICD-10-CM

## 2016-01-24 DIAGNOSIS — C50412 Malignant neoplasm of upper-outer quadrant of left female breast: Secondary | ICD-10-CM

## 2016-01-24 NOTE — Telephone Encounter (Signed)
Appointments made and avs printed °

## 2016-01-24 NOTE — Progress Notes (Signed)
[place]  Telephone:(336) [phone] Fax:(336) [phone]     ID: Sheri Arellano OB: 07-Jul-1951  MR#: [id number]  SHF#:[id number]  PCP: [name], MD GYN:  Olena Mater SU: Fanny Skates OTHER MD: Gery Pray, [facility] [place] Tonia Brooms, Mauri Pole  CHIEF COMPLAINT: Early-stage breast cancer  CURRENT THERAPY: Observation  BREAST CANCER HISTORY: From the original intake note:  Sheri Arellano had routine screening mammography at [facility] 11/21/2013 showing an irregular density in the left breast at the 1:00 position. Ultrasound was obtained 11/29/2013 and showed a 4 mm tolerate them live irregular mass in the left breast at the 2:00 position. This was hypoechoic. Biopsy of this area 12/12/2013 showed (SAA 15-71) an invasive ductal carcinoma, grade 1 or 2, estrogen receptor 0%, progesterone receptor 0%, with no HER-2 amplification, the signals ratio being 1.09 and the number per cell be 1.95. The MIB-1-1 was 72%.  The patient subsequent history is as detailed below  INTERVAL HISTORY: Sheri Arellano returns today for followup of her triple negative breast cancer accompanied by her husband Sheri Arellano. Interval history is unremarkable. She continues to work in Scientist, research (life sciences) estate and she says she was incredibly busy last year and is already busy this year.   REVIEW OF SYSTEMS: Sheri Arellano does not exercise regularly but she "does a lot of walking as part of her job.". She does have A. fib. Return has been gave her but she is not using it. Aside from mild seasonal allergies, detailed review of systems today was entirely benign  PAST MEDICAL HISTORY: Past Medical History  Diagnosis Date  . Esophageal dysmotility   . Anxiety     probable panic attacks  . Insomnia   . GERD (gastroesophageal reflux disease)   . Hyperlipidemia   . Dysphagia   . Vitamin D deficiency   . Other malaise and fatigue   . SVT (supraventricular tachycardia)     documented-takes metoprolol as needed  . H/O hiatal  hernia   . Cancer     breast  . Radiation 06/05/14-07/18/14    left breast 50.4 gray, lumpectomy cavity boosted to 60.4 gray  . Iron deficiency anemia     hx    PAST SURGICAL HISTORY: Past Surgical History  Procedure Laterality Date  . Carpal tunnel release      lt and rt  . Cholecystectomy  1999  . Partial mastectomy with needle localization and axillary sentinel lymph node bx Left 01/16/2014    Procedure: PARTIAL MASTECTOMY WITH NEEDLE LOCALIZATION AND AXILLARY SENTINEL LYMPH NODE BIOPSY;  Surgeon: [name], MD;  Location: [place];  Service: General;  Laterality: Left;  . Portacath placement Right 02/28/2014    Procedure: INSERTION PORT-A-CATH;  Surgeon: [name], MD;  Location: [place];  Service: General;  Laterality: Right;  . Port-a-cath removal Right 08/23/2014    Procedure: MINOR REMOVAL PORT-A-CATH;  Surgeon: Fanny Skates, MD;  Location: [place];  Service: General;  Laterality: Right;  . Colonoscopy with propofol N/A 03/20/2015    Procedure: COLONOSCOPY WITH PROPOFOL;  Surgeon: Garlan Fair, MD;  [facility]: [facility];  Service: Endoscopy;  Laterality: N/A;  . Electrophysiologic study N/A 05/30/2015    Procedure: SVT Ablation;  Surgeon: Evans Lance, MD;  Location: [place] [facility];  Service: Cardiovascular;  Laterality: N/A;    FAMILY HISTORY Family History  Problem Relation Age of Onset  . Atrial fibrillation Mother   . Hyperlipidemia Mother   . Hypertension Mother   . Cancer Father  bladder  . Cancer Maternal Aunt     breast   the patient's parents are living, both in their 45s. The patient had one brother and one no sisters. One of the patient's mothers 3 sisters was diagnosed with breast cancer before the age of 9. There is no history of ovarian cancer in the family.  GYNECOLOGIC HISTORY:  (Reviewed 05/02/2014) Menarche age 19, first live birth age 62, the patient is [place] P2. She underwent menopause at age 63,  and has been on hormone replacement for the last 23 years (estradiol and norethindrone most recently). 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 Their son lives in [place] where he owns an Electronics engineer and [place] [employer] [place] products. Son Sheri Arellano lives in [place], currently working for [employer]. The patient has 3 grandchildren. She is a Furniture conservator/restorer.    ADVANCED DIRECTIVES: In place   HEALTH MAINTENANCE: (Reviewed 05/02/2014) Social History  Substance Use Topics  . Smoking status: Former Smoker -- 1.50 packs/day for 8 years    Types: Cigarettes    Quit date: 01/22/1976  . Smokeless tobacco: Never Used  . Alcohol Use: 4.2 oz/week    7 Shots of liquor per week     Comment: 1 vodka per day     Colonoscopy: Not on file  PAP: March 2014, Dr. Inda Merlin  Bone density: Not on file  Lipid panel: Not on file   Allergies  Allergen Reactions  . Erythromycin Base Other (See Comments)    Stomach ache    Current Outpatient Prescriptions  Medication Sig Dispense Refill  . aspirin EC 81 MG tablet Take 81 mg by mouth daily.    . Cholecalciferol (VITAMIN D) 2000 UNITS tablet Take 2,000 Units by mouth daily.    . clonazePAM (KLONOPIN) 1 MG tablet Take 1 mg by mouth at bedtime.  1  . doxycycline (MONODOX) 100 MG capsule Take 100 mg by mouth at bedtime.     Marland Kitchen loratadine (CLARITIN) 10 MG tablet Take 10 mg by mouth every evening.     . metoprolol tartrate (LOPRESSOR) 25 MG tablet   4  . nitroGLYCERIN (NITROSTAT) 0.4 MG SL tablet Place 0.4 mg under the tongue every 5 (five) minutes as needed (Esophageal spasms).     . pantoprazole (PROTONIX) 40 MG tablet Take 40 mg by mouth daily.    . sertraline (ZOLOFT) 100 MG tablet Take 50 mg by mouth every morning.     . simvastatin (ZOCOR) 40 MG tablet Take 20 mg by mouth at bedtime.     . valACYclovir (VALTREX) 1000 MG  tablet Take 1 g by mouth daily as needed (fever blisters).      No current facility-administered medications for this visit.    OBJECTIVE: Middle-aged white woman who appears well Filed Vitals:   01/24/16 1154  BP: 117/72  Pulse: 76  Temp: 98.4 F (36.9 C)  Resp: 18     Body mass index is 28.37 kg/(m^2).    ECOG FS: 0 Filed Weights   01/24/16 1154  Weight: 157 lb 11.2 oz (71.532 kg)   Sclerae unicteric, EOMs intact Oropharynx clear, dentition in good repair No cervical or supraclavicular adenopathy Lungs no rales or rhonchi Heart regular rate and rhythm Abd soft, nontender, positive bowel sounds MSK no focal spinal tenderness, no upper extremity lymphedema Neuro: nonfocal, well oriented, appropriate affect Breasts: The  right breast shows no suspicious findings. The left breast is status post lumpectomy and radiation. There is no evidence of local recurrence. The left axilla is benign.   LAB RESULTS:  Lab Results  Component Value Date   WBC 5.9 01/17/2016   NEUTROABS 3.8 01/17/2016   HGB 14.2 01/17/2016   HCT 40.8 01/17/2016   MCV 93.4 01/17/2016   PLT 270 01/17/2016      Chemistry      Component Value Date/Time   NA 134* 05/23/2015 1257   NA 137 01/29/2015 1012   K 3.8 05/23/2015 1257   K 4.0 01/29/2015 1012   CL 99 05/23/2015 1257   CO2 30 05/23/2015 1257   CO2 26 01/29/2015 1012   BUN 9 05/23/2015 1257   BUN 8.7 01/29/2015 1012   CREATININE 0.73 05/23/2015 1257   CREATININE 0.8 01/29/2015 1012      Component Value Date/Time   CALCIUM 9.5 05/23/2015 1257   CALCIUM 9.5 01/29/2015 1012   ALKPHOS 69 01/29/2015 1012   AST 17 01/29/2015 1012   ALT 18 01/29/2015 1012   BILITOT 0.72 01/29/2015 1012       STUDIES: No results found.   ASSESSMENT: 65 y.o. [place] woman status post left breast biopsy 12/12/2013 for a clinical T1a N0 invasive ductal carcinoma, low to intermediate grade, triple negative, with an MIB-1 of 72%  (1) status post left  lumpectomy and sentinel lymph node sampling 01/16/2014 for a pT1c pN0, stage IA invasive ductal carcinoma, grade 3, repeat prognostic panel again triple negative.  (2)  adjuvant chemotherapy consisting of 4 cycles of docetaxel/cyclophosphamide given on a Q. three-week basis, completed 05/15/2014  (3) adjuvant radiation completed 07/18/2014   PLAN: Sheri Arellano is now a year out from her definitive surgery with no evidence of disease recurrence. This is very favorable.  She is doing well enough that I would not be uncomfortable seeing her on a yearly basis, but at this point she would prefer to continue every 6 month follow-up and I'm happy to accommodate that.  She probably is walking in off as part of her jaw but doesn't really know this. I encouraged her to use her fit bed so she can counter steps. Ideally she would do 10,000 steps a day.  She has had no problems with palpitations since she had the ablation procedure in June 2016  She is due for mammography next week. She knows to call for any problems that may develop before her next visit here.    Lindaann Slough, MD   01/24/2016 12:14 PM

## 2016-07-16 ENCOUNTER — Other Ambulatory Visit (HOSPITAL_BASED_OUTPATIENT_CLINIC_OR_DEPARTMENT_OTHER): Payer: BLUE CROSS/BLUE SHIELD

## 2016-07-16 DIAGNOSIS — C50412 Malignant neoplasm of upper-outer quadrant of left female breast: Secondary | ICD-10-CM | POA: Diagnosis not present

## 2016-07-16 LAB — CBC WITH DIFFERENTIAL/PLATELET
BASO%: 0.5 % (ref 0.0–2.0)
Basophils Absolute: 0 10*3/uL (ref 0.0–0.1)
EOS%: 2 % (ref 0.0–7.0)
Eosinophils Absolute: 0.1 10*3/uL (ref 0.0–0.5)
HCT: 38.8 % (ref 34.8–46.6)
HGB: 13.9 g/dL (ref 11.6–15.9)
LYMPH%: 27.8 % (ref 14.0–49.7)
MCH: 32.6 pg (ref 25.1–34.0)
MCHC: 35.8 g/dL (ref 31.5–36.0)
MCV: 91.1 fL (ref 79.5–101.0)
MONO#: 0.6 10*3/uL (ref 0.1–0.9)
MONO%: 9.4 % (ref 0.0–14.0)
NEUT#: 3.5 10*3/uL (ref 1.5–6.5)
NEUT%: 60.3 % (ref 38.4–76.8)
Platelets: 236 10*3/uL (ref 145–400)
RBC: 4.26 10*6/uL (ref 3.70–5.45)
RDW: 12.9 % (ref 11.2–14.5)
WBC: 5.9 10*3/uL (ref 3.9–10.3)
lymph#: 1.6 10*3/uL (ref 0.9–3.3)

## 2016-07-16 LAB — COMPREHENSIVE METABOLIC PANEL
ALT: 17 U/L (ref 0–55)
AST: 16 U/L (ref 5–34)
Albumin: 3.9 g/dL (ref 3.5–5.0)
Alkaline Phosphatase: 66 U/L (ref 40–150)
Anion Gap: 7 mEq/L (ref 3–11)
BUN: 8.5 mg/dL (ref 7.0–26.0)
CO2: 27 mEq/L (ref 22–29)
Calcium: 9.5 mg/dL (ref 8.4–10.4)
Chloride: 103 mEq/L (ref 98–109)
Creatinine: 0.8 mg/dL (ref 0.6–1.1)
EGFR: 82 mL/min/{1.73_m2} — ABNORMAL LOW (ref >90)
Glucose: 104 mg/dl (ref 70–140)
Potassium: 4 mEq/L (ref 3.5–5.1)
Sodium: 137 mEq/L (ref 136–145)
Total Bilirubin: 0.41 mg/dL (ref 0.20–1.20)
Total Protein: 6.8 g/dL (ref 6.4–8.3)

## 2016-07-23 ENCOUNTER — Ambulatory Visit (HOSPITAL_BASED_OUTPATIENT_CLINIC_OR_DEPARTMENT_OTHER): Payer: BLUE CROSS/BLUE SHIELD | Admitting: Oncology

## 2016-07-23 ENCOUNTER — Telehealth: Payer: Self-pay | Admitting: Oncology

## 2016-07-23 VITALS — BP 119/80 | HR 79 | Temp 98.9°F | Resp 18 | Ht 62.5 in | Wt 159.9 lb

## 2016-07-23 DIAGNOSIS — C50412 Malignant neoplasm of upper-outer quadrant of left female breast: Secondary | ICD-10-CM

## 2016-07-23 DIAGNOSIS — Z171 Estrogen receptor negative status [ER-]: Secondary | ICD-10-CM | POA: Diagnosis not present

## 2016-07-23 DIAGNOSIS — R609 Edema, unspecified: Secondary | ICD-10-CM

## 2016-07-23 NOTE — Progress Notes (Signed)
[place]  [phone]) [phone] Fax:(336) [phone]     ID: [name] OB: 07-Mar-1951  MR#: [id number]  EZM#:[id number]  PCP: [name], MD GYN:  Olena Mater SU: Fanny Skates OTHER MD: Gery Pray, [facility] Tonia Brooms, Mauri Pole  CHIEF COMPLAINT: Estrogen receptor negative breast cancer  CURRENT THERAPY: Observation  BREAST CANCER HISTORY: From the original intake note:  [name] had routine screening mammography at [facility] 11/21/2013 showing an irregular density in the left breast at the 1:00 position. Ultrasound was obtained 11/29/2013 and showed a 4 mm tolerate them live irregular mass in the left breast at the 2:00 position. This was hypoechoic. Biopsy of this area 12/12/2013 showed (SAA 15-71) an invasive ductal carcinoma, grade 1 or 2, estrogen receptor 0%, progesterone receptor 0%, with no HER-2 amplification, the signals ratio being 1.09 and the number per cell be 1.95. The MIB-1-1 was 72%.  The patient subsequent history is as detailed below  INTERVAL HISTORY: [name] returns today for followup of her triple negative breast cancer accompanied by her husband [name]. Interval history is generally unremarkable. She continues to work at [employer] and they recently visited [place] where they walked "every day for several weeks".   REVIEW OF SYSTEMS: [name] complains of swollen ankles. The ankles did not get swollen on the left are they return from the [place] trip area she denies sodium abuse although her husband tells me she likes eating outer [employer]. She says the problem is a little bit better but still noticeable. She has pains in her hands and also some other joint aches which are not more intense or persistent than before. A detailed review of systems today was otherwise noncontributory  PAST MEDICAL HISTORY: Past Medical History:  Diagnosis Date  . Anxiety    probable panic attacks  . Cancer    breast  .  Dysphagia   . Esophageal dysmotility   . GERD (gastroesophageal reflux disease)   . H/O hiatal hernia   . Hyperlipidemia   . Insomnia   . Iron deficiency anemia    hx  . Other malaise and fatigue   . Radiation 06/05/14-07/18/14   left breast 50.4 gray, lumpectomy cavity boosted to 60.4 gray  . SVT (supraventricular tachycardia)    documented-takes metoprolol as needed  . Vitamin D deficiency     PAST SURGICAL HISTORY: Past Surgical History:  Procedure Laterality Date  . CARPAL TUNNEL RELEASE     lt and rt  . CHOLECYSTECTOMY  1999  . COLONOSCOPY WITH PROPOFOL N/A 03/20/2015   Procedure: COLONOSCOPY WITH PROPOFOL;  Surgeon: Garlan Fair, MD;  Location: [facility];  Service: Endoscopy;  Laterality: N/A;  . ELECTROPHYSIOLOGIC STUDY N/A 05/30/2015   Procedure: SVT Ablation;  Surgeon: Evans Lance, MD;  Location: [place] [facility];  Service: Cardiovascular;  Laterality: N/A;  . PARTIAL MASTECTOMY WITH NEEDLE LOCALIZATION AND AXILLARY SENTINEL LYMPH NODE BX Left 01/16/2014   Procedure: PARTIAL MASTECTOMY WITH NEEDLE LOCALIZATION AND AXILLARY SENTINEL LYMPH NODE BIOPSY;  Surgeon: [name], MD;  Location: [place];  Service: General;  Laterality: Left;  . PORT-A-CATH REMOVAL Right 08/23/2014   Procedure: MINOR REMOVAL PORT-A-CATH;  Surgeon: Fanny Skates, MD;  Location: [place];  Service: General;  Laterality: Right;  . PORTACATH PLACEMENT Right 02/28/2014   Procedure: INSERTION PORT-A-CATH;  Surgeon: [name], MD;  Location: [place];  Service: General;  Laterality: Right;    FAMILY HISTORY Family History  Problem Relation Age of Onset  . Atrial fibrillation Mother   . Hyperlipidemia Mother   . Hypertension Mother   . Cancer Father     bladder  . Cancer Maternal Aunt     breast   the patient's parents are living, both in their 30s. The patient had one brother and one no sisters. One of the patient's mothers 3 sisters was diagnosed  with breast cancer before the age of 28. There is no history of ovarian cancer in the family.  GYNECOLOGIC HISTORY:  (Reviewed 05/02/2014) Menarche age 42, first live birth age 18, the patient is GX P2. She underwent menopause at age 33, and has been on hormone replacement for the last 23 years (estradiol and norethindrone most recently). 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 [name] used to work for [employer] but is currently retired. Their son lives in [place] where he owns an Nurse, learning disability and export company of [place] products. Son [name] lives in [facility] [place], currently working for [employer]. The patient has 3 grandchildren. She is a Investment banker, operational.    ADVANCED DIRECTIVES: In place   HEALTH MAINTENANCE: (Reviewed 05/02/2014) Social History  Substance Use Topics  . Smoking status: Former Smoker    Packs/day: 1.50    Years: 8.00    Types: Cigarettes    Quit date: 01/22/1976  . Smokeless tobacco: Never Used  . Alcohol use 4.2 oz/week    7 Shots of liquor per week     Comment: 1 vodka per day     Colonoscopy: Not on file  PAP: March 2014, Dr. Kevan Ny  Bone density: Not on file  Lipid panel: Not on file   Allergies  Allergen Reactions  . Erythromycin Base Other (See Comments)    Stomach ache    Current Outpatient Prescriptions  Medication Sig Dispense Refill  . aspirin EC 81 MG tablet Take 81 mg by mouth daily.    . Cholecalciferol (VITAMIN D) 2000 UNITS tablet Take 2,000 Units by mouth daily.    . clonazePAM (KLONOPIN) 1 MG tablet Take 1 mg by mouth at bedtime.  1  . doxycycline (MONODOX) 100 MG capsule Take 100 mg by mouth at bedtime.     Marland Kitchen loratadine (CLARITIN) 10 MG tablet Take 10 mg by mouth every evening.     . metoprolol tartrate (LOPRESSOR) 25 MG tablet   4  . nitroGLYCERIN (NITROSTAT) 0.4 MG SL tablet Place 0.4 mg under the tongue every 5 (five) minutes as needed (Esophageal spasms).     .  pantoprazole (PROTONIX) 40 MG tablet Take 40 mg by mouth daily.    . sertraline (ZOLOFT) 100 MG tablet Take 50 mg by mouth every morning.     . simvastatin (ZOCOR) 40 MG tablet Take 20 mg by mouth at bedtime.     . valACYclovir (VALTREX) 1000 MG tablet Take 1 g by mouth daily as needed (fever blisters).      No current facility-administered medications for this visit.     OBJECTIVE: Middle-aged white woman In no acute distress Vitals:   07/23/16 1134  BP: 119/80  Pulse: 79  Resp: 18  Temp: 98.9 F (37.2 C)     Body mass index is 28.78 kg/m.    ECOG FS: 0 Filed Weights   07/23/16 1134  Weight: 159 lb 14.4 oz (72.5 kg)   Sclerae unicteric, pupils round and equal Oropharynx clear and moist-- no thrush or other  lesions No cervical or supraclavicular adenopathy Lungs no rales or rhonchi Heart regular rate and rhythm Abd soft, nontender, positive bowel sounds MSK no focal spinal tenderness, no upper extremity lymphedema, no significant ankle edema Neuro: nonfocal, well oriented, appropriate affect Breasts: The right breast is unremarkable. The left breast has undergone lumpectomy and radiation. There is no evidence of local recurrence. The left axilla is benign.   LAB RESULTS:  Lab Results  Component Value Date   WBC 5.9 07/16/2016   NEUTROABS 3.5 07/16/2016   HGB 13.9 07/16/2016   HCT 38.8 07/16/2016   MCV 91.1 07/16/2016   PLT 236 07/16/2016      Chemistry      Component Value Date/Time   NA 137 07/16/2016 1311   K 4.0 07/16/2016 1311   CL 99 05/23/2015 1257   CO2 27 07/16/2016 1311   BUN 8.5 07/16/2016 1311   CREATININE 0.8 07/16/2016 1311      Component Value Date/Time   CALCIUM 9.5 07/16/2016 1311   ALKPHOS 66 07/16/2016 1311   AST 16 07/16/2016 1311   ALT 17 07/16/2016 1311   BILITOT 0.41 07/16/2016 1311       STUDIES: Mammography at [facility] February 2017 was unremarkable per report  ASSESSMENT: 65 y.o. [place] woman status post left breast upper  outer quadrant biopsy 12/12/2013 for a clinical T1a N0 invasive ductal carcinoma, low to intermediate grade, triple negative, with an MIB-1 of 72%  (1) status post left lumpectomy and sentinel lymph node sampling 01/16/2014 for a pT1c pN0, stage IA invasive ductal carcinoma, grade 3, repeat prognostic panel again triple negative.  (2)  adjuvant chemotherapy consisting of 4 cycles of docetaxel/cyclophosphamide given on a Q. three-week basis, completed 05/15/2014  (3) adjuvant radiation completed 07/18/2014   PLAN: [name] is now 2-1/2 years out from definitive surgery for her breast cancer with no evidence of disease recurrence this is particularly favorable since estrogen receptor negative tumors, if they are to recur, do so particularly in the first 2 years.  The ankle swelling that concerns her is so minimal I would not have remarked that if she had not pointed to it. This likely does wax and wane and today was just "a good day". The most likely explanation for this is incompetent venous valves in the lower extremities and I gave her some illustrations of that. I suggested she consider compression stockings. She will also discuss this with Dr. Inda Merlin at their next visit.  Otherwise she will see me again in March 2018, after her February mammography next year. From that point I will start seeing her on a once a year basis until she completes her 5 years of follow-up.  She knows to call for any problems that may develop before that visit.  Lindaann Slough, MD   07/23/2016 7:24 PM

## 2016-07-23 NOTE — Telephone Encounter (Signed)
appt made and avs printed °

## 2016-09-12 ENCOUNTER — Other Ambulatory Visit (HOSPITAL_COMMUNITY)
Admission: RE | Admit: 2016-09-12 | Discharge: 2016-09-12 | Disposition: A | Discharge status: Home / Self Care | Payer: BLUE CROSS/BLUE SHIELD | Source: Ambulatory Visit | Attending: Obstetrics and Gynecology | Admitting: Obstetrics and Gynecology

## 2016-09-12 ENCOUNTER — Telehealth: Payer: Self-pay

## 2016-09-12 ENCOUNTER — Other Ambulatory Visit: Payer: Self-pay | Admitting: Obstetrics and Gynecology

## 2016-09-12 DIAGNOSIS — Z01419 Encounter for gynecological examination (general) (routine) without abnormal findings: Secondary | ICD-10-CM | POA: Insufficient documentation

## 2016-09-12 DIAGNOSIS — Z1151 Encounter for screening for human papillomavirus (HPV): Secondary | ICD-10-CM | POA: Insufficient documentation

## 2016-09-12 NOTE — Telephone Encounter (Signed)
Dr Bradly Bienenstock GYN called. Pt wants to use estrogen based vaginal supp for dryness. She is triple negative. Dr Suzzette Righter is asking if this is appropriate. Good contact [phone].

## 2016-09-16 LAB — CYTOLOGY - PAP

## 2016-10-01 ENCOUNTER — Other Ambulatory Visit: Payer: Self-pay | Admitting: Oncology

## 2017-02-18 ENCOUNTER — Other Ambulatory Visit (HOSPITAL_BASED_OUTPATIENT_CLINIC_OR_DEPARTMENT_OTHER): Payer: BLUE CROSS/BLUE SHIELD

## 2017-02-18 DIAGNOSIS — C50412 Malignant neoplasm of upper-outer quadrant of left female breast: Secondary | ICD-10-CM | POA: Diagnosis not present

## 2017-02-18 LAB — CBC WITH DIFFERENTIAL/PLATELET
BASO%: 0.6 % (ref 0.0–2.0)
Basophils Absolute: 0 10*3/uL (ref 0.0–0.1)
EOS%: 1.6 % (ref 0.0–7.0)
Eosinophils Absolute: 0.1 10*3/uL (ref 0.0–0.5)
HCT: 41.2 % (ref 34.8–46.6)
HGB: 14.4 g/dL (ref 11.6–15.9)
LYMPH%: 29.1 % (ref 14.0–49.7)
MCH: 32 pg (ref 25.1–34.0)
MCHC: 34.8 g/dL (ref 31.5–36.0)
MCV: 91.9 fL (ref 79.5–101.0)
MONO#: 0.5 10*3/uL (ref 0.1–0.9)
MONO%: 8.1 % (ref 0.0–14.0)
NEUT#: 3.8 10*3/uL (ref 1.5–6.5)
NEUT%: 60.6 % (ref 38.4–76.8)
Platelets: 261 10*3/uL (ref 145–400)
RBC: 4.48 10*6/uL (ref 3.70–5.45)
RDW: 13.3 % (ref 11.2–14.5)
WBC: 6.3 10*3/uL (ref 3.9–10.3)
lymph#: 1.8 10*3/uL (ref 0.9–3.3)

## 2017-02-18 LAB — COMPREHENSIVE METABOLIC PANEL
ALT: 20 U/L (ref 0–55)
AST: 17 U/L (ref 5–34)
Albumin: 4.1 g/dL (ref 3.5–5.0)
Alkaline Phosphatase: 70 U/L (ref 40–150)
Anion Gap: 9 mEq/L (ref 3–11)
BUN: 7.2 mg/dL (ref 7.0–26.0)
CO2: 28 mEq/L (ref 22–29)
Calcium: 9.7 mg/dL (ref 8.4–10.4)
Chloride: 99 mEq/L (ref 98–109)
Creatinine: 0.8 mg/dL (ref 0.6–1.1)
EGFR: 82 mL/min/{1.73_m2} — ABNORMAL LOW (ref >90)
Glucose: 100 mg/dl (ref 70–140)
Potassium: 4.2 mEq/L (ref 3.5–5.1)
Sodium: 136 mEq/L (ref 136–145)
Total Bilirubin: 0.49 mg/dL (ref 0.20–1.20)
Total Protein: 6.9 g/dL (ref 6.4–8.3)

## 2017-02-25 ENCOUNTER — Ambulatory Visit (HOSPITAL_BASED_OUTPATIENT_CLINIC_OR_DEPARTMENT_OTHER): Payer: BLUE CROSS/BLUE SHIELD | Admitting: Oncology

## 2017-02-25 VITALS — BP 141/82 | HR 59 | Temp 97.7°F | Resp 18 | Ht 62.5 in | Wt 161.7 lb

## 2017-02-25 DIAGNOSIS — Z171 Estrogen receptor negative status [ER-]: Secondary | ICD-10-CM

## 2017-02-25 DIAGNOSIS — C50412 Malignant neoplasm of upper-outer quadrant of left female breast: Secondary | ICD-10-CM | POA: Diagnosis not present

## 2017-02-25 NOTE — Progress Notes (Signed)
[place]  Telephone:(336) [phone] Fax:(336) [phone]     ID: Sheri Arellano OB: [birth year]  MR#: [id number]  VFI#:[id number]  PCP: [name], MD GYN:  Olena Mater SU: Fanny Skates OTHER MD: Gery Pray, Thurnell Lose, Cristopher Peru  CHIEF COMPLAINT: Estrogen receptor negative breast cancer  CURRENT THERAPY: Observation  BREAST CANCER HISTORY: From the original intake note:  Sheri Arellano had routine screening mammography at [facility] 11/21/2013 showing an irregular density in the left breast at the 1:00 position. Ultrasound was obtained 11/29/2013 and showed a 4 mm tolerate them live irregular mass in the left breast at the 2:00 position. This was hypoechoic. Biopsy of this area 12/12/2013 showed (SAA 15-71) an invasive ductal carcinoma, grade 1 or 2, estrogen receptor 0%, progesterone receptor 0%, with no HER-2 amplification, the signals ratio being 1.09 and the number per cell be 1.95. The MIB-1-1 was 72%.  The patient subsequent history is as detailed below  INTERVAL HISTORY: Sheri Arellano returns today for follow-up of her triple negative breast cancer. The interval history is significant only for her 65-1/2-year-old mother having fallen and Sheri Arellano now having to spend every other night at her mother's house.  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 She exercises chiefly by walking   REVIEW OF SYSTEMS: She has a history of cystic rosacea and mouth sores, and she is on doxycycline and Valtrex for suppression, which is very effective in her case. A detailed review of systems today was otherwise noncontributory  PAST MEDICAL HISTORY: Past Medical History:  Diagnosis Date  . Anxiety    probable panic attacks  . Cancer    breast  . Dysphagia   . Esophageal dysmotility   . GERD (gastroesophageal reflux disease)   . H/O hiatal hernia   . Hyperlipidemia   . Insomnia   . Iron deficiency anemia    hx  . Other malaise and  fatigue   . Radiation 06/05/14-07/18/14   left breast 50.4 gray, lumpectomy cavity boosted to 60.4 gray  . SVT (supraventricular tachycardia)    documented-takes metoprolol as needed  . Vitamin D deficiency     PAST SURGICAL HISTORY: Past Surgical History:  Procedure Laterality Date  . CARPAL TUNNEL RELEASE     lt and rt  . CHOLECYSTECTOMY  1999  . COLONOSCOPY WITH PROPOFOL N/A 03/20/2015   Procedure: COLONOSCOPY WITH PROPOFOL;  Surgeon: Garlan Fair, MD;  [facility];  Service: Endoscopy;  Laterality: N/A;  . ELECTROPHYSIOLOGIC STUDY N/A 05/30/2015   Procedure: SVT Ablation;  Surgeon: Evans Lance, MD;  Location: [place] [facility];  Service: Cardiovascular;  Laterality: N/A;  . PARTIAL MASTECTOMY WITH NEEDLE LOCALIZATION AND AXILLARY SENTINEL LYMPH NODE BX Left 01/16/2014   Procedure: PARTIAL MASTECTOMY WITH NEEDLE LOCALIZATION AND AXILLARY SENTINEL LYMPH NODE BIOPSY;  Surgeon: [name], MD;  Location: [place];  Service: General;  Laterality: Left;  . PORT-A-CATH REMOVAL Right 08/23/2014   Procedure: MINOR REMOVAL PORT-A-CATH;  Surgeon: Fanny Skates, MD;  Location: [place];  Service: General;  Laterality: Right;  . PORTACATH PLACEMENT Right 02/28/2014   Procedure: INSERTION PORT-A-CATH;  Surgeon: [name], MD;  Location: [place];  Service: General;  Laterality: Right;    FAMILY HISTORY Family History  Problem Relation Age of Onset  . Atrial fibrillation Mother   . Hyperlipidemia Mother   . Hypertension Mother   . Cancer Father     bladder  .  Cancer Maternal Aunt     breast   the patient's parents are living, both in their 32s. The patient had one brother and one no sisters. One of the patient's mothers 3 sisters was diagnosed with breast cancer before the age of 65. There is no history of ovarian cancer in the family.  GYNECOLOGIC HISTORY:  (Reviewed 05/02/2014) Menarche age 81, first live birth age 57, the patient  is Sheri Arellano P2. She underwent menopause at age 30, and has been on hormone replacement for the last 23 years (estradiol and norethindrone most recently). 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 Their son lives in [place] where he owns an Electronics engineer and [place] of [place] products. Son Sheri Arellano lives in [place], currently working for [employer]. The patient has 3 grandchildren. She is a Furniture conservator/restorer.    ADVANCED DIRECTIVES: In place   HEALTH MAINTENANCE: (Reviewed 05/02/2014) Social History  Substance Use Topics  . Smoking status: Former Smoker    Packs/day: 1.50    Years: 8.00    Types: Cigarettes    Quit date: 01/22/1976  . Smokeless tobacco: Never Used  . Alcohol use 4.2 oz/week    7 Shots of liquor per week     Comment: 1 vodka per day     Colonoscopy: Not on file  PAP: March 2014, Dr. Inda Merlin  Bone density: Not on file  Lipid panel: Not on file   Allergies  Allergen Reactions  . Erythromycin Base Other (See Comments)    Stomach ache    Current Outpatient Prescriptions  Medication Sig Dispense Refill  . aspirin EC 81 MG tablet Take 81 mg by mouth daily.    . Cholecalciferol (VITAMIN D) 2000 UNITS tablet Take 2,000 Units by mouth daily.    . clonazePAM (KLONOPIN) 1 MG tablet Take 1 mg by mouth at bedtime.  1  . doxycycline (MONODOX) 100 MG capsule Take 100 mg by mouth at bedtime.     Marland Kitchen loratadine (CLARITIN) 10 MG tablet Take 10 mg by mouth every evening.     . metoprolol tartrate (LOPRESSOR) 25 MG tablet   4  . nitroGLYCERIN (NITROSTAT) 0.4 MG SL tablet Place 0.4 mg under the tongue every 5 (five) minutes as needed (Esophageal spasms).     . pantoprazole (PROTONIX) 40 MG tablet Take 40 mg by mouth daily.    . sertraline (ZOLOFT) 100 MG tablet Take 50 mg by mouth every morning.     . simvastatin (ZOCOR) 40 MG tablet Take 20 mg by mouth at  bedtime.     . valACYclovir (VALTREX) 1000 MG tablet Take 1 g by mouth daily as needed (fever blisters).      No current facility-administered medications for this visit.     OBJECTIVE: Middle-aged white woman Who appears well  Vitals:   02/25/17 1203  BP: (!) 141/82  Pulse: (!) 59  Resp: 18  Temp: 97.7 F (36.5 C)     Body mass index is 29.1 kg/m.    ECOG FS: 0 Filed Weights   02/25/17 1203  Weight: 161 lb 11.2 oz (73.3 kg)   Sclerae unicteric, EOMs intact Oropharynx clear and moist No cervical or supraclavicular adenopathy Lungs no rales or rhonchi Heart regular rate and rhythm Abd soft, nontender, positive bowel sounds MSK no focal spinal tenderness, no upper extremity lymphedema Neuro: nonfocal, well oriented, appropriate affect  Breasts: The right breast is benign. The left breast is status post lumpectomy followed by radiation, with no evidence of disease recurrence. Both axillae are benign.  LAB RESULTS:  Lab Results  Component Value Date   WBC 6.3 02/18/2017   NEUTROABS 3.8 02/18/2017   HGB 14.4 02/18/2017   HCT 41.2 02/18/2017   MCV 91.9 02/18/2017   PLT 261 02/18/2017      Chemistry      Component Value Date/Time   NA 136 02/18/2017 1107   K 4.2 02/18/2017 1107   CL 99 05/23/2015 1257   CO2 28 02/18/2017 1107   BUN 7.2 02/18/2017 1107   CREATININE 0.8 02/18/2017 1107      Component Value Date/Time   CALCIUM 9.7 02/18/2017 1107   ALKPHOS 70 02/18/2017 1107   AST 17 02/18/2017 1107   ALT 20 02/18/2017 1107   BILITOT 0.49 02/18/2017 1107       STUDIES: Mammography at [facility] February 2018 showed no evidence of malignancy yet have the Sheri Arellano Lady mammogram from [place] was hoping to be the 02/03/2017 showed the breast density to be category C. There was no evidence of malignancy. ASSESSMENT: 66 y.o. [place] woman status post left breast upper outer quadrant biopsy 12/12/2013 for a clinical T1a N0 invasive ductal carcinoma, low to intermediate  grade, triple negative, with an MIB-1 of 72%  (1) status post left lumpectomy and sentinel lymph node sampling 01/16/2014 for a pT1c pN0, stage IA invasive ductal carcinoma, grade 3, repeat prognostic panel again triple negative.  (2)  adjuvant chemotherapy consisting of 4 cycles of docetaxel/cyclophosphamide given on a Q. three-week basis, completed 05/15/2014  (3) adjuvant radiation completed 07/18/2014   PLAN: Sheri Arellano is now 3 years out from definitive surgery for her breast cancer with no evidence of disease recurrence. This is very favorable.  She understands that I will negative breast cancers, if they are to recur, tend to recur early. Accordingly it is very gratifying to see how well she is doing  She will see me again in one year. She knows to call for any problems that may develop before that visit.  Chauncey Cruel, MD   02/25/2017 12:20 PM

## 2017-07-12 ENCOUNTER — Encounter: Payer: Self-pay | Admitting: Oncology

## 2017-12-24 ENCOUNTER — Other Ambulatory Visit (HOSPITAL_COMMUNITY)
Admission: RE | Admit: 2017-12-24 | Discharge: 2017-12-24 | Disposition: A | Discharge status: Home / Self Care | Payer: BLUE CROSS/BLUE SHIELD | Source: Ambulatory Visit | Attending: Obstetrics and Gynecology | Admitting: Obstetrics and Gynecology

## 2017-12-24 ENCOUNTER — Other Ambulatory Visit: Payer: Self-pay | Admitting: Obstetrics and Gynecology

## 2017-12-24 DIAGNOSIS — Z124 Encounter for screening for malignant neoplasm of cervix: Secondary | ICD-10-CM | POA: Insufficient documentation

## 2017-12-29 LAB — CYTOLOGY - PAP
Diagnosis: NEGATIVE
HPV: NOT DETECTED

## 2018-02-04 ENCOUNTER — Encounter: Payer: Self-pay | Admitting: Oncology

## 2018-02-23 NOTE — Progress Notes (Signed)
[place]  [phone] [phone]     ID: Sheri Arellano OB: [birth year]  MR#: [id number]  RXV#:[id number]  PCP: Sheri Huddle, MD GYN:  Sheri Arellano SU: Sheri Arellano OTHER MD: Sheri Arellano, Sheri Arellano, Sheri Arellano  CHIEF COMPLAINT: Estrogen receptor negative breast cancer  CURRENT THERAPY: Observation  BREAST CANCER HISTORY: From the original intake note:  Sheri Arellano had routine screening mammography at [facility] 11/21/2013 showing an irregular density in the left breast at the 1:00 position. Ultrasound was obtained 11/29/2013 and showed a 4 mm tolerate them live irregular mass in the left breast at the 2:00 position. This was hypoechoic. Biopsy of this area 12/12/2013 showed (SAA [id number]) an invasive ductal carcinoma, grade 1 or 2, estrogen receptor 0%, progesterone receptor 0%, with no HER-2 amplification, the signals ratio being 1.09 and the number per cell be 1.95. The MIB-1-1 was 72%.  The patient subsequent history is as detailed below  INTERVAL HISTORY: Sheri Arellano returns today for follow-up of her triple negative breast cancer. She continues on observation. She had routine mammography in February 2019. The breast density was found to be category B. There was no evidence of malignancy.     REVIEW OF SYSTEMS: Sheri Arellano reports that she is doing well. She is still working everyday in [employer]. She stays on her feet. She has improved her diet by cutting out more carbohydrates. She is going to [place] this weekend with the whole family. She denies unusual headaches, visual changes, nausea, vomiting, or dizziness. There has been no unusual cough, phlegm production, or pleurisy. This been no change in bowel or bladder habits. She denies unexplained fatigue or unexplained weight loss, bleeding, rash, or fever. A detailed review of systems was otherwise stable.    PAST MEDICAL HISTORY: Past Medical History:  Diagnosis Date  . Anxiety    probable panic  attacks  . Cancer    breast  . Dysphagia   . Esophageal dysmotility   . GERD (gastroesophageal reflux disease)   . H/O hiatal hernia   . Hyperlipidemia   . Insomnia   . Iron deficiency anemia    hx  . Other malaise and fatigue   . Radiation 06/05/14-07/18/14   left breast 50.4 gray, lumpectomy cavity boosted to 60.4 gray  . SVT (supraventricular tachycardia)    documented-takes metoprolol as needed  . Vitamin D deficiency     PAST SURGICAL HISTORY: Past Surgical History:  Procedure Laterality Date  . CARPAL TUNNEL RELEASE     lt and rt  . CHOLECYSTECTOMY  1999  . COLONOSCOPY WITH PROPOFOL N/A 03/20/2015   Procedure: COLONOSCOPY WITH PROPOFOL;  Surgeon: Sheri Fair, MD;  Location: [facility];  Service: Endoscopy;  Laterality: N/A;  . ELECTROPHYSIOLOGIC STUDY N/A 05/30/2015   Procedure: SVT Ablation;  Surgeon: Sheri Lance, MD;  Location: [facility];  Service: Cardiovascular;  Laterality: N/A;  . PARTIAL MASTECTOMY WITH NEEDLE LOCALIZATION AND AXILLARY SENTINEL LYMPH NODE BX Left 01/16/2014   Procedure: PARTIAL MASTECTOMY WITH NEEDLE LOCALIZATION AND AXILLARY SENTINEL LYMPH NODE BIOPSY;  Surgeon: Sheri Hector, MD;  Location: [facility];  Service: General;  Laterality: Left;  . PORT-A-CATH REMOVAL Right 08/23/2014   Procedure: MINOR REMOVAL PORT-A-CATH;  Surgeon: Sheri Skates, MD;  Location: [place];  Service: General;  Laterality: Right;  . PORTACATH PLACEMENT Right 02/28/2014   Procedure: INSERTION PORT-A-CATH;  Surgeon: Sheri Hector, MD;  Location: [facility];  Service: General;  Laterality: Right;  FAMILY HISTORY Family History  Problem Relation Age of Onset  . Atrial fibrillation Mother   . Hyperlipidemia Mother   . Hypertension Mother   . Cancer Father        bladder  . Cancer Maternal Aunt        breast   the patient's parents are living, both in their 89s. The patient had one brother and one no sisters. One of the  patient's mothers 3 sisters was diagnosed with breast cancer before the age of 28. There is no history of ovarian cancer in the family.  GYNECOLOGIC HISTORY:  (Reviewed 05/02/2014) Menarche age 21, first live birth age 31, the patient is [place] P2. She underwent menopause at age 34, and has been on hormone replacement for the last 23 years (estradiol and norethindrone most recently). 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 Their son lives in [place] where he owns an Electronics engineer and [place] [employer] [place] products. Son Sheri Arellano lives in [place], currently working for [employer]. The patient has 3 grandchildren. She is a Furniture conservator/restorer.    ADVANCED DIRECTIVES: In place   HEALTH MAINTENANCE: (Reviewed 05/02/2014) Social History   Tobacco Use  . Smoking status: Former Smoker    Packs/day: 1.50    Years: 8.00    Pack years: 12.00    Types: Cigarettes    Last attempt to quit: 01/22/1976    Years since quitting: 42.1  . Smokeless tobacco: Never Used  Substance Use Topics  . Alcohol use: Yes    Alcohol/week: 4.2 oz    Types: 7 Shots of liquor per week    Comment: 1 vodka per day  . Drug use: No     Colonoscopy: Not on file  PAP: March 2014, Dr. Inda Arellano  Bone density: Not on file  Lipid panel: Not on file   Allergies  Allergen Reactions  . Erythromycin Base Other (See Comments)    Stomach ache    Current Outpatient Medications  Medication Sig Dispense Refill  . aspirin EC 81 MG tablet Take 81 mg by mouth daily.    . Cholecalciferol (VITAMIN D) 2000 UNITS tablet Take 2,000 Units by mouth daily.    . clonazePAM (KLONOPIN) 1 MG tablet Take 1 mg by mouth at bedtime.  1  . doxycycline (MONODOX) 100 MG capsule Take 100 mg by mouth at bedtime.     Marland Kitchen loratadine (CLARITIN) 10 MG tablet Take 10 mg by mouth every evening.     . metoprolol tartrate (LOPRESSOR)  25 MG tablet   4  . nitroGLYCERIN (NITROSTAT) 0.4 MG SL tablet Place 0.4 mg under the tongue every 5 (five) minutes as needed (Esophageal spasms).     . pantoprazole (PROTONIX) 40 MG tablet Take 40 mg by mouth daily.    . sertraline (ZOLOFT) 100 MG tablet Take 50 mg by mouth every morning.     . simvastatin (ZOCOR) 40 MG tablet Take 20 mg by mouth at bedtime.     . valACYclovir (VALTREX) 1000 MG tablet Take 1 g by mouth daily as needed (fever blisters).      No current facility-administered medications for this visit.     OBJECTIVE: Middle-aged white woman in no acute distress  Vitals:   02/25/18 1442  BP: 112/75  Pulse: 65  Resp: 18  Temp: 98.3 F (36.8 C)  SpO2: 100%  Body mass index is 26.58 kg/m.    ECOG FS: 0 Filed Weights   02/25/18 1442  Weight: 147 lb 11.2 oz (67 kg)   Sclerae unicteric, pupils round and equal Oropharynx clear and moist No cervical or supraclavicular adenopathy Lungs no rales or rhonchi Heart regular rate and rhythm Abd soft, nontender, positive bowel sounds MSK no focal spinal tenderness, no upper extremity lymphedema Neuro: nonfocal, well oriented, appropriate affect Breasts: The right breast is unremarkable.  The left breast is undergone lumpectomy followed by radiation.  There is no evidence of disease recurrence.  The cosmetic result is excellent.  Both axillae are benign.  LAB RESULTS:  Lab Results  Component Value Date   WBC 5.2 02/25/2018   NEUTROABS 3.2 02/25/2018   HGB 14.4 02/25/2018   HCT 41.2 02/25/2018   MCV 92.0 02/25/2018   PLT 262 02/25/2018      Chemistry      Component Value Date/Time   NA 136 02/18/2017 1107   K 4.2 02/18/2017 1107   CL 99 05/23/2015 1257   CO2 28 02/18/2017 1107   BUN 7.2 02/18/2017 1107   CREATININE 0.8 02/18/2017 1107      Component Value Date/Time   CALCIUM 9.7 02/18/2017 1107   ALKPHOS 70 02/18/2017 1107   AST 17 02/18/2017 1107   ALT 20 02/18/2017 1107   BILITOT 0.49 02/18/2017 1107        STUDIES: She had routine mammography in February 2019. The breast density was found to be category B. There was no evidence of malignancy.  ASSESSMENT: 67 y.o.  woman status post left breast upper outer quadrant biopsy 12/12/2013 for a clinical T1a N0 invasive ductal carcinoma, low to intermediate grade, triple negative, with an MIB-1 of 72%  (1) status post left lumpectomy and sentinel lymph node sampling 01/16/2014 for a pT1c pN0, stage IA invasive ductal carcinoma, grade 3, repeat prognostic panel again triple negative.  (2)  adjuvant chemotherapy consisting of 4 cycles of docetaxel/cyclophosphamide given on a Q. three-week basis, completed 05/15/2014  (3) adjuvant radiation completed 07/18/2014   PLAN: Sheri Arellano is now 4 years out from definitive surgery for her breast cancer with no evidence of disease recurrence.  This is very favorable.  A friend of hers daughter is researching triple negative breast cancer.  Sheri Arellano is following this closely.  We discussed some of the research currently being done in this subclass of breast cancer.  None of it however applies to her at this point.  I have encouraged her to exercise more regularly.  She does walk a lot as part of her job.  She has a fit bit which she is not using.  Her last visit will be her "graduation" visit.  She will wear pink for that  She knows to call for any other issues that may develop before the next visit.  Alijah Akram, Virgie Dad, MD  02/25/18 2:48 PM Medical Oncology and Hematology [facility] [address] [place], [place] [address] Tel. [phone]    Fax. [phone]  This document serves as a record of services personally performed by Lurline Del, MD. It was created on his behalf by Sheron Nightingale, a trained medical scribe. The creation of this record is based on the scribe's personal observations and the provider's statements to them.   I have reviewed the above  documentation for accuracy and completeness, and I agree with the above.

## 2018-02-25 ENCOUNTER — Inpatient Hospital Stay: Payer: BLUE CROSS/BLUE SHIELD | Attending: Oncology | Admitting: Oncology

## 2018-02-25 ENCOUNTER — Inpatient Hospital Stay: Payer: BLUE CROSS/BLUE SHIELD

## 2018-02-25 ENCOUNTER — Telehealth: Payer: Self-pay | Admitting: Oncology

## 2018-02-25 VITALS — BP 112/75 | HR 65 | Temp 98.3°F | Resp 18 | Ht 62.5 in | Wt 147.7 lb

## 2018-02-25 DIAGNOSIS — E785 Hyperlipidemia, unspecified: Secondary | ICD-10-CM | POA: Insufficient documentation

## 2018-02-25 DIAGNOSIS — R1310 Dysphagia, unspecified: Secondary | ICD-10-CM | POA: Insufficient documentation

## 2018-02-25 DIAGNOSIS — Z803 Family history of malignant neoplasm of breast: Secondary | ICD-10-CM | POA: Insufficient documentation

## 2018-02-25 DIAGNOSIS — C50412 Malignant neoplasm of upper-outer quadrant of left female breast: Secondary | ICD-10-CM

## 2018-02-25 DIAGNOSIS — Z853 Personal history of malignant neoplasm of breast: Secondary | ICD-10-CM | POA: Diagnosis present

## 2018-02-25 DIAGNOSIS — I471 Supraventricular tachycardia: Secondary | ICD-10-CM | POA: Insufficient documentation

## 2018-02-25 DIAGNOSIS — Z9012 Acquired absence of left breast and nipple: Secondary | ICD-10-CM | POA: Diagnosis not present

## 2018-02-25 DIAGNOSIS — D509 Iron deficiency anemia, unspecified: Secondary | ICD-10-CM | POA: Diagnosis not present

## 2018-02-25 DIAGNOSIS — Z79899 Other long term (current) drug therapy: Secondary | ICD-10-CM | POA: Insufficient documentation

## 2018-02-25 DIAGNOSIS — Z87891 Personal history of nicotine dependence: Secondary | ICD-10-CM | POA: Insufficient documentation

## 2018-02-25 DIAGNOSIS — Z8052 Family history of malignant neoplasm of bladder: Secondary | ICD-10-CM

## 2018-02-25 DIAGNOSIS — Z923 Personal history of irradiation: Secondary | ICD-10-CM | POA: Insufficient documentation

## 2018-02-25 DIAGNOSIS — R5383 Other fatigue: Secondary | ICD-10-CM | POA: Insufficient documentation

## 2018-02-25 DIAGNOSIS — Z7982 Long term (current) use of aspirin: Secondary | ICD-10-CM | POA: Insufficient documentation

## 2018-02-25 DIAGNOSIS — K219 Gastro-esophageal reflux disease without esophagitis: Secondary | ICD-10-CM

## 2018-02-25 DIAGNOSIS — G4700 Insomnia, unspecified: Secondary | ICD-10-CM | POA: Diagnosis not present

## 2018-02-25 DIAGNOSIS — F419 Anxiety disorder, unspecified: Secondary | ICD-10-CM | POA: Insufficient documentation

## 2018-02-25 DIAGNOSIS — E559 Vitamin D deficiency, unspecified: Secondary | ICD-10-CM | POA: Diagnosis not present

## 2018-02-25 DIAGNOSIS — Z171 Estrogen receptor negative status [ER-]: Secondary | ICD-10-CM

## 2018-02-25 LAB — COMPREHENSIVE METABOLIC PANEL
ALT: 14 U/L (ref 0–55)
AST: 15 U/L (ref 5–34)
Albumin: 4.2 g/dL (ref 3.5–5.0)
Alkaline Phosphatase: 73 U/L (ref 40–150)
Anion gap: 7 (ref 3–11)
BUN: 11 mg/dL (ref 7–26)
CO2: 27 mmol/L (ref 22–29)
Calcium: 9.8 mg/dL (ref 8.4–10.4)
Chloride: 99 mmol/L (ref 98–109)
Creatinine, Ser: 0.74 mg/dL (ref 0.60–1.10)
GFR calc Af Amer: >60 mL/min (ref >60)
GFR calc non Af Amer: >60 mL/min (ref >60)
Glucose, Bld: 83 mg/dL (ref 70–140)
Potassium: 3.7 mmol/L (ref 3.5–5.1)
Sodium: 133 mmol/L — ABNORMAL LOW (ref 136–145)
Total Bilirubin: 0.6 mg/dL (ref 0.2–1.2)
Total Protein: 7.3 g/dL (ref 6.4–8.3)

## 2018-02-25 LAB — CBC WITH DIFFERENTIAL/PLATELET
Basophils Absolute: 0 10*3/uL (ref 0.0–0.1)
Basophils Relative: 1 %
Eosinophils Absolute: 0.1 10*3/uL (ref 0.0–0.5)
Eosinophils Relative: 1 %
HCT: 41.2 % (ref 34.8–46.6)
Hemoglobin: 14.4 g/dL (ref 11.6–15.9)
Lymphocytes Relative: 30 %
Lymphs Abs: 1.6 10*3/uL (ref 0.9–3.3)
MCH: 32 pg (ref 25.1–34.0)
MCHC: 34.8 g/dL (ref 31.5–36.0)
MCV: 92 fL (ref 79.5–101.0)
Monocytes Absolute: 0.4 10*3/uL (ref 0.1–0.9)
Monocytes Relative: 8 %
Neutro Abs: 3.2 10*3/uL (ref 1.5–6.5)
Neutrophils Relative %: 60 %
Platelets: 262 10*3/uL (ref 145–400)
RBC: 4.48 MIL/uL (ref 3.70–5.45)
RDW: 12.9 % (ref 11.2–14.5)
WBC: 5.2 10*3/uL (ref 3.9–10.3)

## 2018-02-25 NOTE — Telephone Encounter (Signed)
Gave avs and calendar ° °

## 2018-06-15 DIAGNOSIS — L719 Rosacea, unspecified: Secondary | ICD-10-CM | POA: Diagnosis not present

## 2018-06-15 DIAGNOSIS — L738 Other specified follicular disorders: Secondary | ICD-10-CM | POA: Diagnosis not present

## 2018-07-26 DIAGNOSIS — G4700 Insomnia, unspecified: Secondary | ICD-10-CM | POA: Diagnosis not present

## 2018-07-26 DIAGNOSIS — H539 Unspecified visual disturbance: Secondary | ICD-10-CM | POA: Diagnosis not present

## 2018-07-26 DIAGNOSIS — I479 Paroxysmal tachycardia, unspecified: Secondary | ICD-10-CM | POA: Diagnosis not present

## 2018-07-26 DIAGNOSIS — Z23 Encounter for immunization: Secondary | ICD-10-CM | POA: Diagnosis not present

## 2018-07-26 DIAGNOSIS — Z853 Personal history of malignant neoplasm of breast: Secondary | ICD-10-CM | POA: Diagnosis not present

## 2018-07-26 DIAGNOSIS — M79641 Pain in right hand: Secondary | ICD-10-CM | POA: Diagnosis not present

## 2018-07-26 DIAGNOSIS — E782 Mixed hyperlipidemia: Secondary | ICD-10-CM | POA: Diagnosis not present

## 2018-07-26 DIAGNOSIS — Z0001 Encounter for general adult medical examination with abnormal findings: Secondary | ICD-10-CM | POA: Diagnosis not present

## 2018-07-26 DIAGNOSIS — R011 Cardiac murmur, unspecified: Secondary | ICD-10-CM | POA: Diagnosis not present

## 2018-07-26 DIAGNOSIS — Z79899 Other long term (current) drug therapy: Secondary | ICD-10-CM | POA: Diagnosis not present

## 2018-07-26 DIAGNOSIS — E559 Vitamin D deficiency, unspecified: Secondary | ICD-10-CM | POA: Diagnosis not present

## 2018-07-26 DIAGNOSIS — F419 Anxiety disorder, unspecified: Secondary | ICD-10-CM | POA: Diagnosis not present

## 2018-07-26 DIAGNOSIS — D509 Iron deficiency anemia, unspecified: Secondary | ICD-10-CM | POA: Diagnosis not present

## 2018-08-17 DIAGNOSIS — M1712 Unilateral primary osteoarthritis, left knee: Secondary | ICD-10-CM | POA: Diagnosis not present

## 2018-09-09 DIAGNOSIS — H2513 Age-related nuclear cataract, bilateral: Secondary | ICD-10-CM | POA: Diagnosis not present

## 2018-09-29 DIAGNOSIS — L570 Actinic keratosis: Secondary | ICD-10-CM | POA: Diagnosis not present

## 2018-09-29 DIAGNOSIS — L821 Other seborrheic keratosis: Secondary | ICD-10-CM | POA: Diagnosis not present

## 2018-09-29 DIAGNOSIS — Z411 Encounter for cosmetic surgery: Secondary | ICD-10-CM | POA: Diagnosis not present

## 2018-10-02 DIAGNOSIS — Z23 Encounter for immunization: Secondary | ICD-10-CM | POA: Diagnosis not present

## 2018-10-18 ENCOUNTER — Encounter: Payer: Self-pay | Admitting: Interventional Cardiology

## 2018-11-08 ENCOUNTER — Ambulatory Visit (INDEPENDENT_AMBULATORY_CARE_PROVIDER_SITE_OTHER): Payer: Medicare Other | Admitting: Interventional Cardiology

## 2018-11-08 ENCOUNTER — Encounter: Payer: Self-pay | Admitting: Interventional Cardiology

## 2018-11-08 VITALS — BP 130/88 | HR 66 | Ht 62.5 in | Wt 146.4 lb

## 2018-11-08 DIAGNOSIS — R0600 Dyspnea, unspecified: Secondary | ICD-10-CM

## 2018-11-08 DIAGNOSIS — E7800 Pure hypercholesterolemia, unspecified: Secondary | ICD-10-CM

## 2018-11-08 DIAGNOSIS — Z8249 Family history of ischemic heart disease and other diseases of the circulatory system: Secondary | ICD-10-CM | POA: Diagnosis not present

## 2018-11-08 DIAGNOSIS — R072 Precordial pain: Secondary | ICD-10-CM

## 2018-11-08 DIAGNOSIS — R0609 Other forms of dyspnea: Secondary | ICD-10-CM

## 2018-11-08 DIAGNOSIS — Z01812 Encounter for preprocedural laboratory examination: Secondary | ICD-10-CM

## 2018-11-08 MED ORDER — METOPROLOL TARTRATE 50 MG PO TABS: ORAL_TABLET | ORAL | 0 refills | Status: DC

## 2018-11-08 NOTE — Progress Notes (Signed)
Cardiology Office Note   Date:  11/08/2018   ID:  Sheri Arellano, DOB 12-01-1951, MRN [id number]  PCP:  Josetta Huddle, MD    No chief complaint on file.  DOE  [facility] Readings from Last 3 Encounters:  11/08/18 146 lb 6.4 oz (66.4 kg)  02/25/18 147 lb 11.2 oz (67 kg)  02/25/17 161 lb 11.2 oz (73.3 kg)       History of Present Illness: Sheri Arellano is a 67 y.o. female who is being seen today for the evaluation of  at the request of Josetta Huddle, MD.   Electrophysiology study and radiofrequency catheter ablation on 05/30/15 by Dr Lovena Le.  This study demonstrated inducible AVNRT with successful slow pathway modification.  There were no inducible arrhythmias following ablation and no early apparent complications.   She did well for a few years since then.  She has had some DOE oer the past year.  She was waiting to turn 67 y/o before being evaluated.  Her father has had stents.  Brother has ben healthy.    Past Medical History:  Diagnosis Date  . Anxiety    probable panic attacks  . Cancer ([facility])    breast  . Dysphagia   . Esophageal dysmotility   . GERD (gastroesophageal reflux disease)   . H/O hiatal hernia   . Hyperlipidemia   . Insomnia   . Iron deficiency anemia    hx  . Other malaise and fatigue   . Radiation 06/05/14-07/18/14   left breast 50.4 gray, lumpectomy cavity boosted to 60.4 gray  . SVT (supraventricular tachycardia) (H[facility])    documented-takes metoprolol as needed  . Vitamin D deficiency     Past Surgical History:  Procedure Laterality Date  . CARPAL TUNNEL RELEASE     lt and rt  . CHOLECYSTECTOMY  1999  . COLONOSCOPY WITH PROPOFOL N/A 03/20/2015   Procedure: COLONOSCOPY WITH PROPOFOL;  Surgeon: Garlan Fair, MD;  [facility];  Service: Endoscopy;  Laterality: N/A;  . ELECTROPHYSIOLOGIC STUDY N/A 05/30/2015   Procedure: SVT Ablation;  Surgeon: Evans Lance, MD;  Location: [facility];  Service: Cardiovascular;   Laterality: N/A;  . PARTIAL MASTECTOMY WITH NEEDLE LOCALIZATION AND AXILLARY SENTINEL LYMPH NODE BX Left 01/16/2014   Procedure: PARTIAL MASTECTOMY WITH NEEDLE LOCALIZATION AND AXILLARY SENTINEL LYMPH NODE BIOPSY;  Surgeon: [name], MD;  Location: [facility];  Service: General;  Laterality: Left;  . PORT-A-CATH REMOVAL Right 08/23/2014   Procedure: MINOR REMOVAL PORT-A-CATH;  Surgeon: Fanny Skates, MD;  Location: [facility];  Service: General;  Laterality: Right;  . PORTACATH PLACEMENT Right 02/28/2014   Procedure: INSERTION PORT-A-CATH;  Surgeon: [name], MD;  Location: [facility];  Service: General;  Laterality: Right;     Current Outpatient Medications  Medication Sig Dispense Refill  . aspirin EC 81 MG tablet Take 81 mg by mouth daily.    . Cholecalciferol (VITAMIN D) 2000 UNITS tablet Take 2,000 Units by mouth daily.    . clonazePAM (KLONOPIN) 1 MG tablet Take 1 mg by mouth at bedtime.  1  . doxycycline (VIBRAMYCIN) 100 MG capsule Take by mouth daily.  0  . loratadine (CLARITIN) 10 MG tablet Take 10 mg by mouth every evening.     . metoprolol tartrate (LOPRESSOR) 25 MG tablet   4  . nitroGLYCERIN (NITROSTAT) 0.4 MG SL tablet Place 0.4 mg under the tongue every 5 (five) minutes as needed (Esophageal  spasms).     . pantoprazole (PROTONIX) 40 MG tablet Take 40 mg by mouth daily.    . sertraline (ZOLOFT) 100 MG tablet Take 50 mg by mouth every morning.     . simvastatin (ZOCOR) 40 MG tablet Take 20 mg by mouth at bedtime.     . valACYclovir (VALTREX) 1000 MG tablet Take 1 g by mouth daily as needed (fever blisters).      No current facility-administered medications for this visit.     Allergies:   Erythromycin base    Social History:  The patient  reports that she quit smoking about 42 years ago. Her smoking use included cigarettes. She has a 12.00 pack-year smoking history. She has never used smokeless tobacco. She reports that she drinks about  7.0 standard drinks of alcohol per week. She reports that she does not use drugs.   Family History:  The patient's family history includes Atrial fibrillation (age of onset: 71) in her mother; Cancer in her maternal aunt; Cancer (age of onset: 33) in her father; Hypercholesterolemia in her father; Hyperlipidemia in her mother; Hypertension in her father and mother.    ROS:  Please see the history of present illness.   Otherwise, review of systems are positive for DOE.   All other systems are reviewed and negative.    PHYSICAL EXAM: VS:  BP 130/88   Pulse 66   Ht 5' 2.5" (1.588 m)   Wt 146 lb 6.4 oz (66.4 kg)   SpO2 98%   BMI 26.35 kg/m  , BMI Body mass index is 26.35 kg/m. GEN: Well nourished, well developed, in no acute distress  HEENT: normal  Neck: no JVD, carotid bruits, or masses Cardiac: RRR; no murmurs, rubs, or gallops,no edema  Respiratory:  clear to auscultation bilaterally, normal work of breathing GI: soft, nontender, nondistended, + BS MS: no deformity or atrophy  Skin: warm and dry, no rash Neuro:  Strength and sensation are intact Psych: euthymic mood, full affect   Recent Labs: 02/25/2018: ALT 14; BUN 11; Creatinine, Ser 0.74; Hemoglobin 14.4; Platelets 262; Potassium 3.7; Sodium 133   Lipid Panel No results found for: CHOL, TRIG, HDL, CHOLHDL, VLDL, LDLCALC, LDLDIRECT   Other studies Reviewed: Additional studies/ records that were reviewed today with results demonstrating: NSR, poor R wsave progression noted on recent ECG at PMD. Cr. 0.66.   ASSESSMENT AND PLAN:  1.  DOE: Associated chest pressure/indigestion feeling. COuld be angina.  Multiple RF for CAD.  Plan for CTA coronaries.  Knee pain would limit exercise on a treadmill.  2. SVT: Sx resolved since the ablation.  No palpitations.  3. High cholesterol:  Continue statin. 4. Family h/o CAD: Family member with stents.  5. She has been more nervous since her husband had a stent several months ago.      Current medicines are reviewed at length with the patient today.  The patient concerns regarding her medicines were addressed.  The following changes have been made:  No change  Labs/ tests ordered today include:  No orders of the defined types were placed in this encounter.   Recommend 150 minutes/week of aerobic exercise Low fat, low carb, high fiber diet recommended  Disposition:   FU post CT scans   Signed, Larae Grooms, MD  11/08/2018 2:12 PM    [facility] [place], [place], [place]  [address] Phone: [phone]; Fax: [phone]

## 2018-11-08 NOTE — Patient Instructions (Signed)
Medication Instructions:  Your physician recommends that you continue on your current medications as directed. Please refer to the Current Medication list given to you today.  If you need a refill on your cardiac medications before your next appointment, please call your pharmacy.   Lab work: Your physician recommends that you return for lab work prior to Cardiac CT for BMET   If you have labs (blood work) drawn today and your tests are completely normal, you will receive your results only by: Marland Kitchen MyChart Message (if you have MyChart) OR . A paper copy in the mail If you have any lab test that is abnormal or we need to change your treatment, we will call you to review the results.  Testing/Procedures: Your physician has requested that you have cardiac CT. Cardiac computed tomography (CT) is a painless test that uses an x-ray machine to take clear, detailed pictures of your heart. For further information please visit [facility]uge[facility]iest[facility]tn. Please follow instruction sheet as given.  Follow-Up: . Based on test results  Any Other Special Instructions Will Be Listed Below (If Applicable).  CARDIAC CT INSTRUCTIONS  Please arrive at the [facility] main entrance of [facility] on ____ at ____ AM (30-45 minutes prior to test start time)  [facility] [place],  [phone]  Proceed to the [facility] Radiology Department (First Floor).  Please follow these instructions carefully (unless otherwise directed):  On the Night Before the Test: . Be sure to Drink plenty of water. . Do not consume any caffeinated/decaffeinated beverages or chocolate 12 hours prior to your test. . Do not take any antihistamines 12 hours prior to your test.   On the Day of the Test: . Drink plenty of water. Do not drink any water within one hour of the test. . Do not eat any food 4 hours prior to the test. . You may take your regular medications prior to the  test.  . Take metoprolol (Lopressor) two hours prior to test.      After the Test: . Drink plenty of water. . After receiving IV contrast, you may experience a mild flushed feeling. This is normal. . On occasion, you may experience a mild rash up to 24 hours after the test. This is not dangerous. If this occurs, you can take Benadryl 25 mg and increase your fluid intake. . If you experience trouble breathing, this can be serious. If it is severe call 911 IMMEDIATELY. If it is mild, please call our office.

## 2018-12-28 ENCOUNTER — Encounter (HOSPITAL_COMMUNITY): Payer: Self-pay

## 2018-12-28 ENCOUNTER — Telehealth (HOSPITAL_COMMUNITY): Payer: Self-pay | Admitting: Emergency Medicine

## 2018-12-28 ENCOUNTER — Other Ambulatory Visit: Payer: Medicare Other

## 2018-12-28 DIAGNOSIS — Z8249 Family history of ischemic heart disease and other diseases of the circulatory system: Secondary | ICD-10-CM | POA: Diagnosis not present

## 2018-12-28 DIAGNOSIS — R072 Precordial pain: Secondary | ICD-10-CM | POA: Diagnosis not present

## 2018-12-28 DIAGNOSIS — Z01812 Encounter for preprocedural laboratory examination: Secondary | ICD-10-CM | POA: Diagnosis not present

## 2018-12-28 DIAGNOSIS — E7800 Pure hypercholesterolemia, unspecified: Secondary | ICD-10-CM | POA: Diagnosis not present

## 2018-12-28 LAB — BASIC METABOLIC PANEL
BUN/Creatinine Ratio: 19 (ref 12–28)
BUN: 13 mg/dL (ref 8–27)
CO2: 29 mmol/L (ref 20–29)
Calcium: 9.9 mg/dL (ref 8.7–10.3)
Chloride: 95 mmol/L — ABNORMAL LOW (ref 96–106)
Creatinine, Ser: 0.68 mg/dL (ref 0.57–1.00)
GFR calc Af Amer: 106 mL/min/{1.73_m2} (ref >59)
GFR calc non Af Amer: 92 mL/min/{1.73_m2} (ref >59)
Glucose: 111 mg/dL — ABNORMAL HIGH (ref 65–99)
Potassium: 3.5 mmol/L (ref 3.5–5.2)
Sodium: 129 mmol/L — ABNORMAL LOW (ref 134–144)

## 2018-12-28 NOTE — Telephone Encounter (Signed)
My name is Clarise Cruz. I tried to call you this morning at 9:45a. I wanted to go over some instructions about your upcoming coronary CT on 12/29/18 at 10:30a.  Please have your labs drawn TODAY before your appointment tomorrow!  Please arrive 30 mins early  Do not eat for 4 hr prior to test Drink plenty of water until 1 hr prior to test  Take prescribed metoprolol 2 hr prior to test Wear loose fitting clothing and an underwire-free bra    Arrive to main entrance of hospital and take advantage of the free valet parking Go inside and ask any staff for instructions to radiology on 1st floor  Address : [place] [address]  Looking forward to seeing you wednesday! Feel free to call me if you have any questions regarding your test!  Marchia Bond RN Navigator Cardiac [facility] [phone] office

## 2018-12-28 NOTE — Telephone Encounter (Signed)
Pt returning phone call--  pt verbalizes understanding of appt date/time, parking situation and where to check in, pre-test NPO status and medications ordered, and verified current allergies; name and call back number provided for further questions should they arise  Pt going to have labs drawn this afternoon  [facility] [phone] office [phone] cell

## 2018-12-29 ENCOUNTER — Ambulatory Visit (HOSPITAL_COMMUNITY)
Admission: RE | Admit: 2018-12-29 | Discharge: 2018-12-29 | Disposition: A | Discharge status: Home / Self Care | Payer: Medicare Other | Source: Ambulatory Visit | Attending: Interventional Cardiology | Admitting: Interventional Cardiology

## 2018-12-29 DIAGNOSIS — R072 Precordial pain: Secondary | ICD-10-CM | POA: Diagnosis not present

## 2018-12-29 DIAGNOSIS — Z8249 Family history of ischemic heart disease and other diseases of the circulatory system: Secondary | ICD-10-CM | POA: Diagnosis not present

## 2018-12-29 DIAGNOSIS — E7800 Pure hypercholesterolemia, unspecified: Secondary | ICD-10-CM | POA: Diagnosis not present

## 2018-12-29 MED ORDER — NITROGLYCERIN 0.4 MG SL SUBL
0.8000 mg | SUBLINGUAL_TABLET | SUBLINGUAL | Status: DC | PRN
  Administered 2018-12-29: 0.8 mg via SUBLINGUAL
  Filled 2018-12-29 (×2): qty 25

## 2018-12-29 MED ORDER — IOPAMIDOL (ISOVUE-370) INJECTION 76%
80.0000 mL | Freq: Once | INTRAVENOUS | Status: AC | PRN
  Administered 2018-12-29: 80 mL via INTRAVENOUS

## 2018-12-29 MED ORDER — NITROGLYCERIN 0.4 MG SL SUBL
SUBLINGUAL_TABLET | SUBLINGUAL | Status: AC
  Filled 2018-12-29: qty 2

## 2018-12-31 ENCOUNTER — Telehealth: Payer: Self-pay

## 2018-12-31 DIAGNOSIS — R0600 Dyspnea, unspecified: Secondary | ICD-10-CM

## 2018-12-31 DIAGNOSIS — R0609 Other forms of dyspnea: Principal | ICD-10-CM

## 2018-12-31 NOTE — Telephone Encounter (Signed)
Patient had requested to have an echocardiogram for her SOB. Reviewed with Dr. Irish Lack who is okay with patient having echo.   Left message for patient to call back.

## 2018-12-31 NOTE — Telephone Encounter (Signed)
Spoke with the patient and made her aware that Dr. Irish Lack was okay with ordering and echo. Appointment made for 2/11.

## 2018-12-31 NOTE — Telephone Encounter (Signed)
Notes recorded by Drue Novel I, RN on 12/30/2018 at 12:50 PM EST The patient has been notified of the result and verbalized understanding. All questions (if any) were answered. Cleon Gustin, RN 12/30/2018 12:45 PM

## 2018-12-31 NOTE — Telephone Encounter (Signed)
-----   Message from Jettie Booze, MD sent at 12/30/2018 12:29 PM EST ----- No coronary calcium.  Minimal CAD in the RCA.  Continue preventive therapy.

## 2019-01-06 ENCOUNTER — Other Ambulatory Visit: Payer: Self-pay | Admitting: Obstetrics and Gynecology

## 2019-01-06 ENCOUNTER — Other Ambulatory Visit (HOSPITAL_COMMUNITY)
Admission: RE | Admit: 2019-01-06 | Discharge: 2019-01-06 | Disposition: A | Discharge status: Home / Self Care | Payer: Medicare Other | Source: Ambulatory Visit | Attending: Obstetrics and Gynecology | Admitting: Obstetrics and Gynecology

## 2019-01-06 DIAGNOSIS — Z01419 Encounter for gynecological examination (general) (routine) without abnormal findings: Secondary | ICD-10-CM | POA: Insufficient documentation

## 2019-01-06 DIAGNOSIS — Z01411 Encounter for gynecological examination (general) (routine) with abnormal findings: Secondary | ICD-10-CM | POA: Diagnosis not present

## 2019-01-06 DIAGNOSIS — N952 Postmenopausal atrophic vaginitis: Secondary | ICD-10-CM | POA: Diagnosis not present

## 2019-01-06 DIAGNOSIS — Z124 Encounter for screening for malignant neoplasm of cervix: Secondary | ICD-10-CM | POA: Diagnosis not present

## 2019-01-10 LAB — CYTOLOGY - PAP: Diagnosis: NEGATIVE

## 2019-01-17 ENCOUNTER — Telehealth: Payer: Self-pay | Admitting: Oncology

## 2019-01-17 ENCOUNTER — Encounter: Payer: Self-pay | Admitting: Oncology

## 2019-01-17 ENCOUNTER — Inpatient Hospital Stay: Payer: Medicare Other

## 2019-01-17 ENCOUNTER — Inpatient Hospital Stay: Payer: Medicare Other | Attending: Oncology | Admitting: Oncology

## 2019-01-17 VITALS — BP 130/88 | HR 68 | Temp 98.0°F | Resp 18 | Ht 62.5 in | Wt 151.7 lb

## 2019-01-17 DIAGNOSIS — Z171 Estrogen receptor negative status [ER-]: Secondary | ICD-10-CM

## 2019-01-17 DIAGNOSIS — R0602 Shortness of breath: Secondary | ICD-10-CM | POA: Diagnosis not present

## 2019-01-17 DIAGNOSIS — E785 Hyperlipidemia, unspecified: Secondary | ICD-10-CM | POA: Insufficient documentation

## 2019-01-17 DIAGNOSIS — C50412 Malignant neoplasm of upper-outer quadrant of left female breast: Secondary | ICD-10-CM | POA: Diagnosis not present

## 2019-01-17 DIAGNOSIS — Z87891 Personal history of nicotine dependence: Secondary | ICD-10-CM | POA: Diagnosis not present

## 2019-01-17 DIAGNOSIS — Z923 Personal history of irradiation: Secondary | ICD-10-CM | POA: Diagnosis not present

## 2019-01-17 DIAGNOSIS — F419 Anxiety disorder, unspecified: Secondary | ICD-10-CM | POA: Insufficient documentation

## 2019-01-17 DIAGNOSIS — Z7982 Long term (current) use of aspirin: Secondary | ICD-10-CM | POA: Diagnosis not present

## 2019-01-17 DIAGNOSIS — Z79899 Other long term (current) drug therapy: Secondary | ICD-10-CM | POA: Diagnosis not present

## 2019-01-17 LAB — CBC WITH DIFFERENTIAL/PLATELET
Abs Immature Granulocytes: 0.01 10*3/uL (ref 0.00–0.07)
Basophils Absolute: 0 10*3/uL (ref 0.0–0.1)
Basophils Relative: 0 %
Eosinophils Absolute: 0.1 10*3/uL (ref 0.0–0.5)
Eosinophils Relative: 1 %
HCT: 40.4 % (ref 36.0–46.0)
Hemoglobin: 13.9 g/dL (ref 12.0–15.0)
Immature Granulocytes: 0 %
Lymphocytes Relative: 19 %
Lymphs Abs: 1.5 10*3/uL (ref 0.7–4.0)
MCH: 31.9 pg (ref 26.0–34.0)
MCHC: 34.4 g/dL (ref 30.0–36.0)
MCV: 92.7 fL (ref 80.0–100.0)
Monocytes Absolute: 0.6 10*3/uL (ref 0.1–1.0)
Monocytes Relative: 7 %
Neutro Abs: 5.7 10*3/uL (ref 1.7–7.7)
Neutrophils Relative %: 73 %
Platelets: 247 10*3/uL (ref 150–400)
RBC: 4.36 MIL/uL (ref 3.87–5.11)
RDW: 12 % (ref 11.5–15.5)
WBC: 8 10*3/uL (ref 4.0–10.5)
nRBC: 0 % (ref 0.0–0.2)

## 2019-01-17 LAB — COMPREHENSIVE METABOLIC PANEL
ALT: 11 U/L (ref 0–44)
AST: 14 U/L — ABNORMAL LOW (ref 15–41)
Albumin: 4.2 g/dL (ref 3.5–5.0)
Alkaline Phosphatase: 62 U/L (ref 38–126)
Anion gap: 10 (ref 5–15)
BUN: 12 mg/dL (ref 8–23)
CO2: 27 mmol/L (ref 22–32)
Calcium: 9.8 mg/dL (ref 8.9–10.3)
Chloride: 99 mmol/L (ref 98–111)
Creatinine, Ser: 0.79 mg/dL (ref 0.44–1.00)
GFR calc Af Amer: >60 mL/min (ref >60)
GFR calc non Af Amer: >60 mL/min (ref >60)
Glucose, Bld: 84 mg/dL (ref 70–99)
Potassium: 4.5 mmol/L (ref 3.5–5.1)
Sodium: 136 mmol/L (ref 135–145)
Total Bilirubin: 0.6 mg/dL (ref 0.3–1.2)
Total Protein: 6.8 g/dL (ref 6.5–8.1)

## 2019-01-17 NOTE — Progress Notes (Signed)
[place]  Telephone:(336) [phone] Fax:(336) [phone]     ID: GENEVIEVE ARBAUGH OB: 01-17-1951  MR#: [id number]  ZHY#:[id number]   Patient Care Team: Josetta Huddle, MD as PCP - General (Internal Medicine) Fanny Skates, MD as Consulting Physician (General Surgery) Gery Pray, MD as Consulting Physician (Radiation Oncology) Evans Lance, MD as Consulting Physician (Cardiology) Jettie Booze, MD as Consulting Physician (Cardiology) Thurnell Lose, MD as Consulting Physician (Obstetrics and Gynecology)   CHIEF COMPLAINT: Estrogen receptor negative breast cancer  CURRENT THERAPY: Observation  BREAST CANCER HISTORY: From the original intake note:  [name] had routine screening mammography at [facility] 11/21/2013 showing an irregular density in the left breast at the 1:00 position. Ultrasound was obtained 11/29/2013 and showed a 4 mm tolerate them live irregular mass in the left breast at the 2:00 position. This was hypoechoic. Biopsy of this area 12/12/2013 showed (SAA [id number]) an invasive ductal carcinoma, grade 1 or 2, estrogen receptor 0%, progesterone receptor 0%, with no HER-2 amplification, the signals ratio being 1.09 and the number per cell be 1.95. The MIB-1-1 was 72%.  The patient subsequent history is as detailed below  INTERVAL HISTORY: Sheri Arellano returns today for follow-up of her triple negative breast cancer. She continues on observation. She is accompanied by her husband.  She is scheduled for her routine mammogram at [facility] on 02/10/2019.  She is being evaluated for shortness of breath by Dr. Irish Lack, who obtained a CT for coronary morphology, showing a calcium score of 0, with essentially normal right dominant coronaries, less than 30% soft plaque in the mid right coronary artery, and normal aortic root diameter at 3.6 cm.    REVIEW OF SYSTEMS: Sheri Arellano reports not doing any exercise outside of work. She continues to work in Personal assistant. She does not  keep track of her steps. The patient denies unusual headaches, visual changes, nausea, vomiting, stiff neck, dizziness, or gait imbalance. There has been no cough, phlegm production, or pleurisy, no chest pain or pressure, and no change in bowel or bladder habits. The patient denies fever, rash, bleeding, unexplained fatigue or unexplained weight loss. A detailed review of systems was otherwise entirely negative.   PAST MEDICAL HISTORY: Past Medical History:  Diagnosis Date  . Anxiety    probable panic attacks  . Cancer ([place])    breast  . Dysphagia   . Esophageal dysmotility   . GERD (gastroesophageal reflux disease)   . H/O hiatal hernia   . Hyperlipidemia   . Insomnia   . Iron deficiency anemia    hx  . Other malaise and fatigue   . Radiation 06/05/14-07/18/14   left breast 50.4 gray, lumpectomy cavity boosted to 60.4 gray  . SVT (supraventricular tachycardia) (HCC)    documented-takes metoprolol as needed  . Vitamin D deficiency     PAST SURGICAL HISTORY: Past Surgical History:  Procedure Laterality Date  . CARPAL TUNNEL RELEASE     lt and rt  . CHOLECYSTECTOMY  1999  . COLONOSCOPY WITH PROPOFOL N/A 03/20/2015   Procedure: COLONOSCOPY WITH PROPOFOL;  Surgeon: Garlan Fair, MD;  Location: [facility];  Service: Endoscopy;  Laterality: N/A;  . ELECTROPHYSIOLOGIC STUDY N/A 05/30/2015   Procedure: SVT Ablation;  Surgeon: Evans Lance, MD;  Location: [place] [facility];  Service: Cardiovascular;  Laterality: N/A;  . PARTIAL MASTECTOMY WITH NEEDLE LOCALIZATION AND AXILLARY SENTINEL LYMPH NODE BX Left 01/16/2014   Procedure: PARTIAL MASTECTOMY WITH NEEDLE LOCALIZATION AND AXILLARY SENTINEL LYMPH NODE BIOPSY;  Surgeon: [name], MD;  Location: [place];  Service: General;  Laterality: Left;  . PORT-A-CATH REMOVAL Right 08/23/2014   Procedure: MINOR REMOVAL PORT-A-CATH;  Surgeon: Fanny Skates, MD;  Location: [place];  Service: General;   Laterality: Right;  . PORTACATH PLACEMENT Right 02/28/2014   Procedure: INSERTION PORT-A-CATH;  Surgeon: [name], MD;  Location: [place];  Service: General;  Laterality: Right;    FAMILY HISTORY Family History  Problem Relation Age of Onset  . Atrial fibrillation Mother 66  . Hyperlipidemia Mother   . Hypertension Mother   . Cancer Father 51       bladder  . Hypercholesterolemia Father   . Hypertension Father   . Cancer Maternal Aunt        breast  . Colon cancer Neg Hx   . CAD Neg Hx    the patient's parents are living, both in their 14s. The patient had one brother and one no sisters. One of the patient's mothers 3 sisters was diagnosed with breast cancer before the age of 66. There is no history of ovarian cancer in the family.  GYNECOLOGIC HISTORY:  (Reviewed 05/02/2014) Menarche age 14, first live birth age 71, the patient is [place] P2. She underwent menopause at age 40, and has been on hormone replacement for the last 23 years (estradiol and norethindrone most recently). This was stopped 12/17/2012  SOCIAL HISTORY:   (Updated 01/17/2019) [name] works as a Forensic psychologist. [name] used to work for [employer] but is currently retired. Their son, age 36, lives in [place] where he owns an Electronics engineer and [employer] [place] products. Son Ruby Cola lives in [place], currently working for [employer]. The patient has 3 grandchildren (ages 19, 11, and 56). She is a Furniture conservator/restorer.    ADVANCED DIRECTIVES: In place   HEALTH MAINTENANCE: (Reviewed 05/02/2014) Social History   Tobacco Use  . Smoking status: Former Smoker    Packs/day: 1.50    Years: 8.00    Pack years: 12.00    Types: Cigarettes    Last attempt to quit: 01/22/1976    Years since quitting: 43.0  . Smokeless tobacco: Never Used  Substance Use Topics  . Alcohol use: Yes    Alcohol/week: 7.0 standard drinks    Types: 7 Shots of liquor per week    Comment: 1 vodka per day  . Drug use: No      Colonoscopy: Not on file  PAP: 01/06/2019, normal, Dr. Simona Huh  Bone density: Not on file  Lipid panel: Not on file   Allergies  Allergen Reactions  . Erythromycin Base Other (See Comments)    Stomach ache    Current Outpatient Medications  Medication Sig Dispense Refill  . aspirin EC 81 MG tablet Take 81 mg by mouth daily.    . Cholecalciferol (VITAMIN D) 2000 UNITS tablet Take 2,000 Units by mouth daily.    . clonazePAM (KLONOPIN) 1 MG tablet Take 1 mg by mouth at bedtime.  1  . doxycycline (VIBRAMYCIN) 100 MG capsule Take by mouth daily.  0  . loratadine (CLARITIN) 10 MG tablet Take 10 mg by mouth every evening.     . metoprolol tartrate (LOPRESSOR) 50 MG tablet Take 1 tablet by mouth 2 hours prior to Cardiac CT 1 tablet 0  . nitroGLYCERIN (NITROSTAT) 0.4 MG SL tablet Place 0.4 mg under the tongue every 5 (five) minutes as needed (Esophageal spasms).     Marland Kitchen  pantoprazole (PROTONIX) 40 MG tablet Take 40 mg by mouth daily.    . sertraline (ZOLOFT) 100 MG tablet Take 50 mg by mouth every morning.     . simvastatin (ZOCOR) 40 MG tablet Take 20 mg by mouth at bedtime.     . valACYclovir (VALTREX) 1000 MG tablet Take 1 g by mouth daily as needed (fever blisters).      No current facility-administered medications for this visit.     OBJECTIVE: Middle-aged white woman who appears stated age  47:   01/17/19 1348  BP: 130/88  Pulse: 68  Resp: 18  Temp: 98 F (36.7 C)  SpO2: 100%     Body mass index is 27.3 kg/m.    ECOG FS: 0 Filed Weights   01/17/19 1348  Weight: 151 lb 11.2 oz (68.8 kg)   Sclerae unicteric, EOMs intact No cervical or supraclavicular adenopathy Lungs no rales or rhonchi Heart regular rate and rhythm Abd soft, nontender, positive bowel sounds MSK no focal spinal tenderness, no upper extremity lymphedema Neuro: nonfocal, well oriented, appropriate affect Breasts: The right breast is benign.  The left breast is status post lumpectomy and radiation.   There is no evidence of local recurrence.  Both axillae are benign.  LAB RESULTS:  Lab Results  Component Value Date   WBC 8.0 01/17/2019   NEUTROABS 5.7 01/17/2019   HGB 13.9 01/17/2019   HCT 40.4 01/17/2019   MCV 92.7 01/17/2019   PLT 247 01/17/2019      Chemistry      Component Value Date/Time   NA 136 01/17/2019 1337   NA 129 (L) 12/28/2018 1653   NA 136 02/18/2017 1107   K 4.5 01/17/2019 1337   K 4.2 02/18/2017 1107   CL 99 01/17/2019 1337   CO2 27 01/17/2019 1337   CO2 28 02/18/2017 1107   BUN 12 01/17/2019 1337   BUN 13 12/28/2018 1653   BUN 7.2 02/18/2017 1107   CREATININE 0.79 01/17/2019 1337   CREATININE 0.8 02/18/2017 1107      Component Value Date/Time   CALCIUM 9.8 01/17/2019 1337   CALCIUM 9.7 02/18/2017 1107   ALKPHOS 62 01/17/2019 1337   ALKPHOS 70 02/18/2017 1107   AST 14 (L) 01/17/2019 1337   AST 17 02/18/2017 1107   ALT 11 01/17/2019 1337   ALT 20 02/18/2017 1107   BILITOT 0.6 01/17/2019 1337   BILITOT 0.49 02/18/2017 1107       STUDIES: Ct Coronary Morph W/cta Cor W/score W/ca W/cm &/or Wo/cm  Addendum Date: 12/29/2018   ADDENDUM REPORT: 12/29/2018 12:45 CLINICAL DATA:  Chest pain EXAM: Cardiac CTA MEDICATIONS: Sub lingual nitro. 4 mg and lopressor 71m TECHNIQUE: The patient was scanned on a [id number] scanner. Gantry rotation speed was 250 msecs. Collimation was. 6 mm . A 120 kV prospective scan was triggered in the ascending thoracic aorta at 140 HU's with full mA between 30-70% of the R-R interval . Average HR during the scan was 55 bpm. The 3D data set was interpreted on a dedicated work station using MPR, MIP and VRT modes. A total of 80 cc of contrast was used. FINDINGS: Non-cardiac: [facility] No significant findings on limited lung and soft tissue windows. Calcium score: No calcium noted Coronary Arteries: Right dominant with no anomalies LM: Normal LAD: Normal IM: Large branching vessel normal D1:  Normal Circumflex: Normal OM1: Small vessel normal AV Groove: Small and normal RCA: Less than 30% soft plaque  in mid vessel PDA: Normal PLA: Normal IMPRESSION: 1.  Calcium score 0 2. Essentially normal right dominant coronary arteries with less than 30% soft plaque in mid RCA 3.  Normal aortic root diameter 3.6 cm Jenkins Rouge Electronically Signed   By: Jenkins Rouge M.D.   On: 12/29/2018 12:45   Result Date: 12/29/2018 EXAM: OVER-READ INTERPRETATION  CT CHEST The following report is an over-read performed by radiologist Dr. Vinnie Langton of [facility], [place] on 12/29/2018. This over-read does not include interpretation of cardiac or coronary anatomy or pathology. The coronary calcium score/coronary CTA interpretation by the cardiologist is attached. COMPARISON:  None. FINDINGS: Aortic atherosclerosis. Within the visualized portions of the thorax there are no suspicious appearing pulmonary nodules or masses, there is no acute consolidative airspace disease, no pleural effusions, no pneumothorax and no lymphadenopathy. Visualized portions of the upper abdomen are unremarkable. There are no aggressive appearing lytic or blastic lesions noted in the visualized portions of the skeleton. Surgical clips in the lateral aspect of the left breast likely from prior lumpectomy. IMPRESSION: 1.  Aortic Atherosclerosis (ICD10-I70.0). Electronically Signed: By: Vinnie Langton M.D. On: 12/29/2018 12:01    ASSESSMENT: 68 y.o. [place] woman status post left breast upper outer quadrant biopsy 12/12/2013 for a clinical T1a N0 invasive ductal carcinoma, low to intermediate grade, triple negative, with an MIB-1 of 72%  (1) status post left lumpectomy and sentinel lymph node sampling 01/16/2014 for a pT1c pN0, stage IA invasive ductal carcinoma, grade 3, repeat prognostic panel again triple negative.  (2)  adjuvant chemotherapy consisting of 4 cycles of docetaxel/cyclophosphamide given on a Q. three-week basis,  completed 05/15/2014  (3) adjuvant radiation completed 07/18/2014   PLAN: Sheri Arellano is now 5 years out from definitive surgery for her breast cancer with no evidence of disease recurrence.  This is very favorable.  At this point I feel comfortable releasing her to her primary care physician.  All she will need in terms of breast cancer follow-up is a yearly physician breast exam and yearly mammography, preferably with tomography.  She is interested in participating in our survivorship program and I have made her an appointment with our survivorship nurse practitioner for next year.  If Sheri Arellano wants to see me a year after that and alternate with the survivorship nurse practitioner we can arrange for that as well  At this point however we are making no further routine appointments for her with me here.    Magrinat, Virgie Dad, MD  01/17/19 2:20 PM Medical Oncology and Hematology [facility] [address] [place], [place] [address] Tel. [phone]    Fax. [phone]  I, Wilburn Mylar, am acting as scribe for Dr. Virgie Dad. Magrinat.

## 2019-01-17 NOTE — Telephone Encounter (Signed)
Gave avs and calendar ° °

## 2019-01-18 ENCOUNTER — Ambulatory Visit (HOSPITAL_COMMUNITY): Payer: Medicare Other | Attending: Cardiology

## 2019-01-18 DIAGNOSIS — R0600 Dyspnea, unspecified: Secondary | ICD-10-CM

## 2019-01-18 DIAGNOSIS — R0609 Other forms of dyspnea: Secondary | ICD-10-CM | POA: Diagnosis not present

## 2019-02-08 DIAGNOSIS — H2513 Age-related nuclear cataract, bilateral: Secondary | ICD-10-CM | POA: Diagnosis not present

## 2019-02-08 DIAGNOSIS — H1045 Other chronic allergic conjunctivitis: Secondary | ICD-10-CM | POA: Diagnosis not present

## 2019-02-08 DIAGNOSIS — H16223 Keratoconjunctivitis sicca, not specified as Sjogren's, bilateral: Secondary | ICD-10-CM | POA: Diagnosis not present

## 2019-02-10 ENCOUNTER — Encounter: Payer: Self-pay | Admitting: Oncology

## 2019-02-10 DIAGNOSIS — Z853 Personal history of malignant neoplasm of breast: Secondary | ICD-10-CM | POA: Diagnosis not present

## 2019-02-28 DIAGNOSIS — J069 Acute upper respiratory infection, unspecified: Secondary | ICD-10-CM | POA: Diagnosis not present

## 2019-03-10 DIAGNOSIS — L570 Actinic keratosis: Secondary | ICD-10-CM | POA: Diagnosis not present

## 2019-04-20 DIAGNOSIS — L719 Rosacea, unspecified: Secondary | ICD-10-CM | POA: Diagnosis not present

## 2019-04-20 DIAGNOSIS — D225 Melanocytic nevi of trunk: Secondary | ICD-10-CM | POA: Diagnosis not present

## 2019-04-20 DIAGNOSIS — L821 Other seborrheic keratosis: Secondary | ICD-10-CM | POA: Diagnosis not present

## 2019-04-20 DIAGNOSIS — L570 Actinic keratosis: Secondary | ICD-10-CM | POA: Diagnosis not present

## 2019-07-13 ENCOUNTER — Ambulatory Visit (INDEPENDENT_AMBULATORY_CARE_PROVIDER_SITE_OTHER): Payer: Medicare Other | Admitting: Orthopaedic Surgery

## 2019-07-13 ENCOUNTER — Encounter: Payer: Self-pay | Admitting: Orthopaedic Surgery

## 2019-07-13 ENCOUNTER — Ambulatory Visit (INDEPENDENT_AMBULATORY_CARE_PROVIDER_SITE_OTHER): Payer: Medicare Other

## 2019-07-13 DIAGNOSIS — M1712 Unilateral primary osteoarthritis, left knee: Secondary | ICD-10-CM | POA: Diagnosis not present

## 2019-07-13 MED ORDER — METHYLPREDNISOLONE ACETATE 40 MG/ML IJ SUSP
40.0000 mg | INTRAMUSCULAR | Status: AC | PRN
  Administered 2019-07-13: 40 mg via INTRA_ARTICULAR

## 2019-07-13 MED ORDER — LIDOCAINE HCL 1 % IJ SOLN
2.0000 mL | INTRAMUSCULAR | Status: AC | PRN
  Administered 2019-07-13: 20:00:00 2 mL

## 2019-07-13 MED ORDER — BUPIVACAINE HCL 0.5 % IJ SOLN
2.0000 mL | INTRAMUSCULAR | Status: AC | PRN
  Administered 2019-07-13: 20:00:00 2 mL via INTRA_ARTICULAR

## 2019-07-13 NOTE — Progress Notes (Signed)
Office Visit Note   Patient: Sheri Arellano           Date of Birth: 18-Feb-1951           MRN: [id number] Visit Date: 07/13/2019              Requested by: Sheri Huddle, MD [address] [place],  [place] [address] PCP: Sheri Huddle, MD   Assessment & Plan: Visit Diagnoses:  1. Primary osteoarthritis of left knee     Plan: Impression is moderately severe left knee degenerative joint disease with joint effusion.  Aspiration performed today as well as cortisone injection.  Patient will take it easy over the next week or so.  I have provided her with a pamphlet on Visco supplementation.  Follow-up as needed.  Follow-Up Instructions: Return if symptoms worsen or fail to improve.   Orders:  Orders Placed This Encounter  Procedures  . XR Knee Complete 4 Views Left   No orders of the defined types were placed in this encounter.     Procedures: Large Joint Inj: L knee on 07/13/2019 7:43 PM Details: 22 G needle Medications: 2 mL bupivacaine 0.5 %; 2 mL lidocaine 1 %; 40 mg methylPREDNISolone acetate 40 MG/ML Aspirate: 11 mL clear Outcome: tolerated well, no immediate complications Patient was prepped and draped in the usual sterile fashion.       Clinical Data: No additional findings.   Subjective: Chief Complaint  Patient presents with  . Left Knee - Pain    Sheri Arellano is a 68 year old female who comes in for increasing left knee pain for the last several months.  The pain is worse on the medial side and is characterized by throbbing at night.  She denies any definite trauma.  Denies any numbness and tingling.  She does endorse some swelling.  She is not taking any medications for this.  She has not had any surgeries.   Review of Systems  Constitutional: Negative.   HENT: Negative.   Eyes: Negative.   Respiratory: Negative.   Cardiovascular: Negative.   Endocrine: Negative.   Musculoskeletal: Negative.   Neurological: Negative.   Hematological:  Negative.   Psychiatric/Behavioral: Negative.   All other systems reviewed and are negative.    Objective: Vital Signs: There were no vitals taken for this visit.  Physical Exam Vitals signs and nursing note reviewed.  Constitutional:      Appearance: She is well-developed.  HENT:     Head: Normocephalic and atraumatic.  Neck:     Musculoskeletal: Neck supple.  Pulmonary:     Effort: Pulmonary effort is normal.  Abdominal:     Palpations: Abdomen is soft.  Skin:    General: Skin is warm.     Capillary Refill: Capillary refill takes less than 2 seconds.  Neurological:     Mental Status: She is alert and oriented to person, place, and time.  Psychiatric:        Behavior: Behavior normal.        Thought Content: Thought content normal.        Judgment: Judgment normal.     Ortho Exam Left knee exam shows a small joint effusion.  Collaterals and cruciates are stable.  Medial joint line tenderness.  Relatively preserved range of motion. Specialty Comments:  No specialty comments available.  Imaging: Xr Knee Complete 4 Views Left  Result Date: 07/13/2019 Moderately severe medial joint space narrowing with spurring.  Patellofemoral compartment spurring as well.  PMFS History: Patient Active Problem List   Diagnosis Date Noted  . SVT (supraventricular tachycardia) ([place]) 05/30/2015  . Malignant neoplasm of upper-outer quadrant of left breast in female, estrogen receptor negative ([place]) 12/15/2013  . Rectal mucosa prolapse, anal skin tags 02/16/2012  . HYPERCHOLESTEROLEMIA 03/28/2009  . Anxiety state 03/28/2009  . SUPRAVENTRICULAR TACHYCARDIA 03/28/2009   Past Medical History:  Diagnosis Date  . Anxiety    probable panic attacks  . Cancer ([place])    breast  . Dysphagia   . Esophageal dysmotility   . GERD (gastroesophageal reflux disease)   . H/O hiatal hernia   . Hyperlipidemia   . Insomnia   . Iron deficiency anemia    hx  . Other malaise and fatigue   .  Radiation 06/05/14-07/18/14   left breast 50.4 gray, lumpectomy cavity boosted to 60.4 gray  . SVT (supraventricular tachycardia) (HCC)    documented-takes metoprolol as needed  . Vitamin D deficiency     Family History  Problem Relation Age of Onset  . Atrial fibrillation Mother 20  . Hyperlipidemia Mother   . Hypertension Mother   . Cancer Father 11       bladder  . Hypercholesterolemia Father   . Hypertension Father   . Cancer Maternal Aunt        breast  . Colon cancer Neg Hx   . CAD Neg Hx     Past Surgical History:  Procedure Laterality Date  . CARPAL TUNNEL RELEASE     lt and rt  . CHOLECYSTECTOMY  1999  . COLONOSCOPY WITH PROPOFOL N/A 03/20/2015   Procedure: COLONOSCOPY WITH PROPOFOL;  Surgeon: Garlan Fair, MD;  [facility];  Service: Endoscopy;  Laterality: N/A;  . ELECTROPHYSIOLOGIC STUDY N/A 05/30/2015   Procedure: SVT Ablation;  Surgeon: Evans Lance, MD;  Location: [place] [facility];  Service: Cardiovascular;  Laterality: N/A;  . PARTIAL MASTECTOMY WITH NEEDLE LOCALIZATION AND AXILLARY SENTINEL LYMPH NODE BX Left 01/16/2014   Procedure: PARTIAL MASTECTOMY WITH NEEDLE LOCALIZATION AND AXILLARY SENTINEL LYMPH NODE BIOPSY;  Surgeon: [name], MD;  Location: [place];  Service: General;  Laterality: Left;  . PORT-A-CATH REMOVAL Right 08/23/2014   Procedure: MINOR REMOVAL PORT-A-CATH;  Surgeon: Fanny Skates, MD;  Location: [place];  Service: General;  Laterality: Right;  . PORTACATH PLACEMENT Right 02/28/2014   Procedure: INSERTION PORT-A-CATH;  Surgeon: [name], MD;  Location: [place];  Service: General;  Laterality: Right;   Social History   Occupational History  . Occupation: Architectural technologist: [employer]  Tobacco Use  . Smoking status: Former Smoker    Packs/day: 1.50    Years: 8.00    Pack years: 12.00    Types: Cigarettes    Quit date: 01/22/1976    Years since quitting: 43.5  .  Smokeless tobacco: Never Used  Substance and Sexual Activity  . Alcohol use: Yes    Alcohol/week: 7.0 standard drinks    Types: 7 Shots of liquor per week    Comment: 1 vodka per day  . Drug use: No  . Sexual activity: Yes    Birth control/protection: Post-menopausal

## 2019-07-20 DIAGNOSIS — R51 Headache: Secondary | ICD-10-CM | POA: Diagnosis not present

## 2019-07-20 DIAGNOSIS — R0981 Nasal congestion: Secondary | ICD-10-CM | POA: Diagnosis not present

## 2019-08-02 ENCOUNTER — Telehealth: Payer: Self-pay | Admitting: Orthopaedic Surgery

## 2019-08-02 NOTE — Telephone Encounter (Signed)
Please see below. Patient requests gel injection from Dr. Erlinda Hong.

## 2019-08-02 NOTE — Telephone Encounter (Signed)
Yes please

## 2019-08-02 NOTE — Telephone Encounter (Signed)
Please advise. Would you like for me to send information for April to get authorization?

## 2019-08-02 NOTE — Telephone Encounter (Signed)
Patient called to inquire about getting an gel injection. Stated that she told MA she wanted to get one.  Please call patient to advise. Explained process to patient.  [phone]

## 2019-08-05 NOTE — Telephone Encounter (Signed)
Noted  

## 2019-08-16 ENCOUNTER — Telehealth: Payer: Self-pay

## 2019-08-16 NOTE — Telephone Encounter (Signed)
Submitted VOB for SynviscOne, left knee. 

## 2019-08-19 ENCOUNTER — Telehealth: Payer: Self-pay

## 2019-08-19 NOTE — Telephone Encounter (Signed)
Called and left a VM advising patient to CB to schedule appointment with Dr. Erlinda Hong for gel injection.  Approved for SynviscOne, left knee. Buy & Bill Covered at 100% after Medicare pays through [facility]) No Co-pay No PA required

## 2019-08-23 ENCOUNTER — Encounter: Payer: Self-pay | Admitting: Orthopaedic Surgery

## 2019-08-23 ENCOUNTER — Ambulatory Visit (INDEPENDENT_AMBULATORY_CARE_PROVIDER_SITE_OTHER): Payer: Medicare Other | Admitting: Orthopaedic Surgery

## 2019-08-23 DIAGNOSIS — M1712 Unilateral primary osteoarthritis, left knee: Secondary | ICD-10-CM

## 2019-08-23 MED ORDER — LIDOCAINE HCL 1 % IJ SOLN
2.0000 mL | INTRAMUSCULAR | Status: AC | PRN
  Administered 2019-08-23: 2 mL

## 2019-08-23 MED ORDER — HYLAN G-F 20 48 MG/6ML IX SOSY
48.0000 mg | PREFILLED_SYRINGE | INTRA_ARTICULAR | Status: AC | PRN
  Administered 2019-08-23: 48 mg via INTRA_ARTICULAR

## 2019-08-23 MED ORDER — BUPIVACAINE HCL 0.25 % IJ SOLN
2.0000 mL | INTRAMUSCULAR | Status: AC | PRN
  Administered 2019-08-23: 2 mL via INTRA_ARTICULAR

## 2019-08-23 NOTE — Progress Notes (Signed)
   Procedure Note  Patient: Sheri Arellano             Date of Birth: [birth year]           MRN: [id number]             Visit Date: 08/23/2019  Procedures: Visit Diagnoses:  1. Unilateral primary osteoarthritis, left knee     Large Joint Inj: L knee on 08/23/2019 12:56 PM Indications: pain Details: 22 G needle, anterolateral approach Medications: 2 mL bupivacaine 0.25 %; 48 mg Hylan 48 MG/6ML; 2 mL lidocaine 1 %

## 2019-10-09 DIAGNOSIS — Z23 Encounter for immunization: Secondary | ICD-10-CM | POA: Diagnosis not present

## 2019-10-31 DIAGNOSIS — Z23 Encounter for immunization: Secondary | ICD-10-CM | POA: Diagnosis not present

## 2019-10-31 DIAGNOSIS — Z03818 Encounter for observation for suspected exposure to other biological agents ruled out: Secondary | ICD-10-CM | POA: Diagnosis not present

## 2019-10-31 DIAGNOSIS — Z0001 Encounter for general adult medical examination with abnormal findings: Secondary | ICD-10-CM | POA: Diagnosis not present

## 2019-10-31 DIAGNOSIS — K219 Gastro-esophageal reflux disease without esophagitis: Secondary | ICD-10-CM | POA: Diagnosis not present

## 2019-10-31 DIAGNOSIS — Z79899 Other long term (current) drug therapy: Secondary | ICD-10-CM | POA: Diagnosis not present

## 2019-10-31 DIAGNOSIS — E559 Vitamin D deficiency, unspecified: Secondary | ICD-10-CM | POA: Diagnosis not present

## 2019-10-31 DIAGNOSIS — C50912 Malignant neoplasm of unspecified site of left female breast: Secondary | ICD-10-CM | POA: Diagnosis not present

## 2019-10-31 DIAGNOSIS — F419 Anxiety disorder, unspecified: Secondary | ICD-10-CM | POA: Diagnosis not present

## 2019-10-31 DIAGNOSIS — I479 Paroxysmal tachycardia, unspecified: Secondary | ICD-10-CM | POA: Diagnosis not present

## 2019-10-31 DIAGNOSIS — D509 Iron deficiency anemia, unspecified: Secondary | ICD-10-CM | POA: Diagnosis not present

## 2019-10-31 DIAGNOSIS — G4700 Insomnia, unspecified: Secondary | ICD-10-CM | POA: Diagnosis not present

## 2019-10-31 DIAGNOSIS — E782 Mixed hyperlipidemia: Secondary | ICD-10-CM | POA: Diagnosis not present

## 2019-10-31 DIAGNOSIS — Z1389 Encounter for screening for other disorder: Secondary | ICD-10-CM | POA: Diagnosis not present

## 2019-11-15 ENCOUNTER — Ambulatory Visit (INDEPENDENT_AMBULATORY_CARE_PROVIDER_SITE_OTHER): Payer: Medicare Other | Admitting: Orthopaedic Surgery

## 2019-11-15 ENCOUNTER — Other Ambulatory Visit: Payer: Self-pay

## 2019-11-15 ENCOUNTER — Encounter: Payer: Self-pay | Admitting: Orthopaedic Surgery

## 2019-11-15 DIAGNOSIS — M1712 Unilateral primary osteoarthritis, left knee: Secondary | ICD-10-CM | POA: Diagnosis not present

## 2019-11-15 MED ORDER — LIDOCAINE HCL 1 % IJ SOLN
2.0000 mL | INTRAMUSCULAR | Status: AC | PRN
  Administered 2019-11-15: 15:00:00 2 mL

## 2019-11-15 MED ORDER — METHYLPREDNISOLONE ACETATE 40 MG/ML IJ SUSP
40.0000 mg | INTRAMUSCULAR | Status: AC | PRN
  Administered 2019-11-15: 40 mg via INTRA_ARTICULAR

## 2019-11-15 MED ORDER — BUPIVACAINE HCL 0.5 % IJ SOLN
2.0000 mL | INTRAMUSCULAR | Status: AC | PRN
  Administered 2019-11-15: 2 mL via INTRA_ARTICULAR

## 2019-11-15 NOTE — Progress Notes (Signed)
Office Visit Note   Patient: Sheri Arellano           Date of Birth: Apr 23, 1951           MRN: [id number] Visit Date: 11/15/2019              Requested by: Josetta Huddle, MD [address] [place],  [place] [address] PCP: Josetta Huddle, MD   Assessment & Plan: Visit Diagnoses:  1. Primary osteoarthritis of left knee     Plan: Impression is end-stage left knee DJD.  Patient would like to try another cortisone injection today in hopes of giving her some relief through the holidays.  She has tried Voltaren gel without significant relief.  Follow-up as needed.  Follow-Up Instructions: Return if symptoms worsen or fail to improve.   Orders:  No orders of the defined types were placed in this encounter.  No orders of the defined types were placed in this encounter.     Procedures: Large Joint Inj: L knee on 11/15/2019 2:56 PM Details: 22 G needle Medications: 2 mL bupivacaine 0.5 %; 2 mL lidocaine 1 %; 40 mg methylPREDNISolone acetate 40 MG/ML Outcome: tolerated well, no immediate complications Patient was prepped and draped in the usual sterile fashion.       Clinical Data: No additional findings.   Subjective: Chief Complaint  Patient presents with  . Left Knee - Pain, Follow-up    Sheri Arellano returns today for chronic left knee pain.  She would like to try another cortisone injection.  Gel injection did not help.  She continues to have chronic left knee pain worse with standing and walking activity and after prolonged sitting.   Review of Systems   Objective: Vital Signs: There were no vitals taken for this visit.  Physical Exam  Ortho Exam Left knee exam shows a trace joint effusion. Specialty Comments:  No specialty comments available.  Imaging: No results found.   PMFS History: Patient Active Problem List   Diagnosis Date Noted  . SVT (supraventricular tachycardia) ([place]) 05/30/2015  . Malignant neoplasm of upper-outer quadrant of  left breast in female, estrogen receptor negative ([facility]) 12/15/2013  . Rectal mucosa prolapse, anal skin tags 02/16/2012  . HYPERCHOLESTEROLEMIA 03/28/2009  . Anxiety state 03/28/2009  . SUPRAVENTRICULAR TACHYCARDIA 03/28/2009   Past Medical History:  Diagnosis Date  . Anxiety    probable panic attacks  . Cancer ([facility])    breast  . Dysphagia   . Esophageal dysmotility   . GERD (gastroesophageal reflux disease)   . H/O hiatal hernia   . Hyperlipidemia   . Insomnia   . Iron deficiency anemia    hx  . Other malaise and fatigue   . Radiation 06/05/14-07/18/14   left breast 50.4 gray, lumpectomy cavity boosted to 60.4 gray  . SVT (supraventricular tachycardia) (HCC)    documented-takes metoprolol as needed  . Vitamin D deficiency     Family History  Problem Relation Age of Onset  . Atrial fibrillation Mother 26  . Hyperlipidemia Mother   . Hypertension Mother   . Cancer Father 37       bladder  . Hypercholesterolemia Father   . Hypertension Father   . Cancer Maternal Aunt        breast  . Colon cancer Neg Hx   . CAD Neg Hx     Past Surgical History:  Procedure Laterality Date  . CARPAL TUNNEL RELEASE     lt and rt  .  CHOLECYSTECTOMY  1999  . COLONOSCOPY WITH PROPOFOL N/A 03/20/2015   Procedure: COLONOSCOPY WITH PROPOFOL;  Surgeon: Garlan Fair, MD;  [facility];  Service: Endoscopy;  Laterality: N/A;  . ELECTROPHYSIOLOGIC STUDY N/A 05/30/2015   Procedure: SVT Ablation;  Surgeon: Evans Lance, MD;  Location: [facility];  Service: Cardiovascular;  Laterality: N/A;  . PARTIAL MASTECTOMY WITH NEEDLE LOCALIZATION AND AXILLARY SENTINEL LYMPH NODE BX Left 01/16/2014   Procedure: PARTIAL MASTECTOMY WITH NEEDLE LOCALIZATION AND AXILLARY SENTINEL LYMPH NODE BIOPSY;  Surgeon: [name], MD;  Location: [facility];  Service: General;  Laterality: Left;  . PORT-A-CATH REMOVAL Right 08/23/2014   Procedure: MINOR REMOVAL PORT-A-CATH;  Surgeon:  Fanny Skates, MD;  Location: [facility];  Service: General;  Laterality: Right;  . PORTACATH PLACEMENT Right 02/28/2014   Procedure: INSERTION PORT-A-CATH;  Surgeon: [name], MD;  Location: [facility];  Service: General;  Laterality: Right;   Social History   Occupational History  . Occupation: Architectural technologist: [employer]  Tobacco Use  . Smoking status: Former Smoker    Packs/day: 1.50    Years: 8.00    Pack years: 12.00    Types: Cigarettes    Quit date: 01/22/1976    Years since quitting: 43.8  . Smokeless tobacco: Never Used  Substance and Sexual Activity  . Alcohol use: Yes    Alcohol/week: 7.0 standard drinks    Types: 7 Shots of liquor per week    Comment: 1 vodka per day  . Drug use: No  . Sexual activity: Yes    Birth control/protection: Post-menopausal

## 2019-12-27 DIAGNOSIS — L570 Actinic keratosis: Secondary | ICD-10-CM | POA: Diagnosis not present

## 2019-12-27 DIAGNOSIS — Z411 Encounter for cosmetic surgery: Secondary | ICD-10-CM | POA: Diagnosis not present

## 2020-01-05 ENCOUNTER — Telehealth: Payer: Self-pay | Admitting: Adult Health

## 2020-01-05 NOTE — Telephone Encounter (Signed)
I left a message regarding reschedule °

## 2020-01-20 ENCOUNTER — Other Ambulatory Visit: Payer: Medicare Other

## 2020-01-20 ENCOUNTER — Encounter: Payer: Medicare Other | Admitting: Adult Health

## 2020-02-06 NOTE — Progress Notes (Signed)
CLINIC:  Survivorship   REASON FOR VISIT:  Routine follow-up for history of breast cancer.   BRIEF ONCOLOGIC HISTORY:  Per Dr. Virgie Dad last note:  69 y.o. [place] woman status post left breast upper outer quadrant biopsy 12/12/2013 for a clinical T1a N0 invasive ductal carcinoma, low to intermediate grade, triple negative, with an MIB-1 of 72%  (1) status post left lumpectomy and sentinel lymph node sampling 01/16/2014 for a pT1c pN0, stage IA invasive ductal carcinoma, grade 3, repeat prognostic panel again triple negative.  (2)  adjuvant chemotherapy consisting of 4 cycles of docetaxel/cyclophosphamide given on a Q. three-week basis, completed 05/15/2014  (3) adjuvant radiation completed 07/18/2014  INTERVAL HISTORY:  Sheri Arellano presents to the [facility] today for routine follow-up for her history of breast cancer.  Overall, she reports feeling quite well. She was diagnosed when she was 69 years old.    Since her last visit, she underwent bilateral mammogram on 02/10/2019 that showed no evidence of malignancy and breast density category B.    She is working on weight loss, and is following a keto diet.  She sees her PCP regularly and is up to date with her cancer screenings.     REVIEW OF SYSTEMS:  Review of Systems  Constitutional: Negative for appetite change, chills, fatigue, fever and unexpected weight change.  HENT:   Negative for hearing loss and lump/mass.   Eyes: Negative for eye problems and icterus.  Respiratory: Negative for chest tightness, cough and shortness of breath.   Cardiovascular: Negative for chest pain, leg swelling and palpitations.  Gastrointestinal: Negative for abdominal distention, abdominal pain, constipation, diarrhea, nausea and vomiting.  Endocrine: Negative for hot flashes.  Musculoskeletal: Negative for arthralgias.  Skin: Negative for itching and rash.  Neurological: Negative for dizziness, extremity weakness, headaches and  numbness.  Hematological: Negative for adenopathy. Does not bruise/bleed easily.  Psychiatric/Behavioral: Negative for depression. The patient is not nervous/anxious.   Breast: Denies any new nodularity, masses, tenderness, nipple changes, or nipple discharge.       PAST MEDICAL/SURGICAL HISTORY:  Past Medical History:  Diagnosis Date  . Anxiety    probable panic attacks  . Cancer ([facility])    breast  . Dysphagia   . Esophageal dysmotility   . GERD (gastroesophageal reflux disease)   . H/O hiatal hernia   . Hyperlipidemia   . Insomnia   . Iron deficiency anemia    hx  . Other malaise and fatigue   . Radiation 06/05/14-07/18/14   left breast 50.4 gray, lumpectomy cavity boosted to 60.4 gray  . SVT (supraventricular tachycardia) (HCC)    documented-takes metoprolol as needed  . Vitamin D deficiency    Past Surgical History:  Procedure Laterality Date  . CARPAL TUNNEL RELEASE     lt and rt  . CHOLECYSTECTOMY  1999  . COLONOSCOPY WITH PROPOFOL N/A 03/20/2015   Procedure: COLONOSCOPY WITH PROPOFOL;  Surgeon: Garlan Fair, MD;  [facility];  Service: Endoscopy;  Laterality: N/A;  . ELECTROPHYSIOLOGIC STUDY N/A 05/30/2015   Procedure: SVT Ablation;  Surgeon: Evans Lance, MD;  Location: [facility];  Service: Cardiovascular;  Laterality: N/A;  . PARTIAL MASTECTOMY WITH NEEDLE LOCALIZATION AND AXILLARY SENTINEL LYMPH NODE BX Left 01/16/2014   Procedure: PARTIAL MASTECTOMY WITH NEEDLE LOCALIZATION AND AXILLARY SENTINEL LYMPH NODE BIOPSY;  Surgeon: [name], MD;  Location: [facility];  Service: General;  Laterality: Left;  . PORT-A-CATH REMOVAL Right 08/23/2014   Procedure:  MINOR REMOVAL PORT-A-CATH;  Surgeon: Fanny Skates, MD;  Location: [facility];  Service: General;  Laterality: Right;  . PORTACATH PLACEMENT Right 02/28/2014   Procedure: INSERTION PORT-A-CATH;  Surgeon: [name], MD;  Location: [facility];  Service: General;   Laterality: Right;     ALLERGIES:  Allergies  Allergen Reactions  . Erythromycin Base Other (See Comments)    Stomach ache     CURRENT MEDICATIONS:  Outpatient Encounter Medications as of 02/07/2020  Medication Sig  . aspirin EC 81 MG tablet Take 81 mg by mouth daily.  . Cholecalciferol (VITAMIN D) 2000 UNITS tablet Take 2,000 Units by mouth daily.  . clonazePAM (KLONOPIN) 1 MG tablet Take 1 mg by mouth at bedtime.  Marland Kitchen doxycycline (VIBRAMYCIN) 100 MG capsule Take by mouth daily.  Marland Kitchen loratadine (CLARITIN) 10 MG tablet Take 10 mg by mouth every evening.   . metoprolol tartrate (LOPRESSOR) 50 MG tablet Take 1 tablet by mouth 2 hours prior to Cardiac CT  . nitroGLYCERIN (NITROSTAT) 0.4 MG SL tablet Place 0.4 mg under the tongue every 5 (five) minutes as needed (Esophageal spasms).   . pantoprazole (PROTONIX) 40 MG tablet Take 40 mg by mouth daily.  . sertraline (ZOLOFT) 100 MG tablet Take 50 mg by mouth every morning.   . simvastatin (ZOCOR) 40 MG tablet Take 20 mg by mouth at bedtime.   . valACYclovir (VALTREX) 1000 MG tablet Take 1 g by mouth daily as needed (fever blisters).    No facility-administered encounter medications on file as of 02/07/2020.     ONCOLOGIC FAMILY HISTORY:  Family History  Problem Relation Age of Onset  . Atrial fibrillation Mother 63  . Hyperlipidemia Mother   . Hypertension Mother   . Cancer Father 29       bladder  . Hypercholesterolemia Father   . Hypertension Father   . Cancer Maternal Aunt        breast  . Colon cancer Neg Hx   . CAD Neg Hx     GENETIC COUNSELING/TESTING: Not at this time  SOCIAL HISTORY:  Social History   Socioeconomic History  . Marital status: Married    Spouse name: Not on file  . Number of children: 2  . Years of education: Not on file  . Highest education level: Not on file  Occupational History  . Occupation: Architectural technologist: [employer]  Tobacco Use  . Smoking status: Former Smoker     Packs/day: 1.50    Years: 8.00    Pack years: 12.00    Types: Cigarettes    Quit date: 01/22/1976    Years since quitting: 44.0  . Smokeless tobacco: Never Used  Substance and Sexual Activity  . Alcohol use: Yes    Alcohol/week: 7.0 standard drinks    Types: 7 Shots of liquor per week    Comment: 1 vodka per day  . Drug use: No  . Sexual activity: Yes    Birth control/protection: Post-menopausal  Other Topics Concern  . Not on file  Social History Narrative  . Not on file   Social Determinants of Health   Financial Resource Strain:   . Difficulty of Paying Living Expenses: Not on file  Food Insecurity:   . Worried About Charity fundraiser in the Last Year: Not on file  . Ran Out of Food in the Last Year: Not on file  Transportation Needs:   . Lack of Transportation (Medical): Not on  file  . Lack of Transportation (Non-Medical): Not on file  Physical Activity:   . Days of Exercise per Week: Not on file  . Minutes of Exercise per Session: Not on file  Stress:   . Feeling of Stress : Not on file  Social Connections:   . Frequency of Communication with Friends and Family: Not on file  . Frequency of Social Gatherings with Friends and Family: Not on file  . Attends Religious Services: Not on file  . Active Member of Clubs or Organizations: Not on file  . Attends Archivist Meetings: Not on file  . Marital Status: Not on file  Intimate Partner Violence:   . Fear of Current or Ex-Partner: Not on file  . Emotionally Abused: Not on file  . Physically Abused: Not on file  . Sexually Abused: Not on file      PHYSICAL EXAMINATION:  Vital Signs: Vitals:   02/07/20 1118  BP: (!) 143/84  Pulse: 69  Resp: 18  Temp: 98 F (36.7 C)  SpO2: 100%   Filed Weights   02/07/20 1118  Weight: 164 lb 3.2 oz (74.5 kg)   General: Well-nourished, well-appearing female in no acute distress.  Unaccompanied today.   HEENT: Head is normocephalic.  Pupils equal and reactive  to light. Conjunctivae clear without exudate.  Sclerae anicteric. Oral mucosa is pink, moist.  Oropharynx is pink without lesions or erythema.  Lymph: No cervical, supraclavicular, or infraclavicular lymphadenopathy noted on palpation.  Cardiovascular: Regular rate and rhythm.Marland Kitchen Respiratory: Clear to auscultation bilaterally. Chest expansion symmetric; breathing non-labored.  Breast Exam:  -Left breast: No appreciable masses on palpation. No skin redness, thickening, or peau d'orange appearance; no nipple retraction or nipple discharge; mild distortion in symmetry at previous lumpectomy site well healed scar without erythema or nodularity.  -Right breast: No appreciable masses on palpation. No skin redness, thickening, or peau d'orange appearance; no nipple retraction or nipple discharge;  -Axilla: No axillary adenopathy bilaterally.  GI: Abdomen soft and round; non-tender, non-distended. Bowel sounds normoactive. No hepatosplenomegaly.   GU: Deferred.  Neuro: No focal deficits. Steady gait.  Psych: Mood and affect normal and appropriate for situation.  MSK: No focal spinal tenderness to palpation, full range of motion in bilateral upper extremities Extremities: No edema. Skin: Warm and dry.  LABORATORY DATA:  None for this visit   DIAGNOSTIC IMAGING:  Most recent mammogram:    ASSESSMENT AND PLAN:  Ms.. Arellano is a pleasant 69 y.o. female with history of Stage IA left breast invasive ductal carcinoma, ER-/PR-/HER2-, diagnosed in 2015, treated with lumpectomy, adjuvant chemotherapy, and adjuvant radiation therapy.  [facility] for surveillance and routine follow-up.   1. History of breast cancer:  Sheri Arellano is currently clinically and radiographically without evidence of disease or recurrence of breast cancer. She will be due for mammogram in 02/2020; orders placed today.  She will return in one year for labs and f/u.  I encouraged her to call me with any  questions or concerns before her next visit at the cancer center, and I would be happy to see her sooner, if needed.    2.  Bone health:  Given Sheri Arellano's age, history of breast cancer, she is at risk for bone demineralization. Marland Kitchen  She was given education on specific food and activities to promote bone health.  3. Cancer screening:  Due to Sheri Arellano's history and her age, she should receive screening for skin cancers, colon cancer,  and gynecologic cancers. She was encouraged to follow-up with her PCP for appropriate cancer screenings.   4. Health maintenance and wellness promotion: Sheri Arellano was encouraged to consume 5-7 servings of fruits and vegetables per day. She was also encouraged to engage in moderate to vigorous exercise for 30 minutes per day most days of the week. She was instructed to limit her alcohol consumption and continue to abstain from tobacco use.    Dispo:  -Return to cancer center in one year for LTS follow up mammogram in 02/2020    Total encounter time: 30 minutes*    Gardenia Phlegm, NP [place] [phone]   Note: PRIMARY CARE PROVIDER Josetta Huddle, [place] [phone]  *Total Encounter Time as defined by the Centers for Medicare and Medicaid Services includes, in addition to the face-to-face time of a patient visit (documented in the note above) non-face-to-face time: obtaining and reviewing outside history, ordering and reviewing medications, tests or procedures, care coordination (communications with other health care professionals or caregivers) and documentation in the medical record.

## 2020-02-07 ENCOUNTER — Other Ambulatory Visit: Payer: Self-pay

## 2020-02-07 ENCOUNTER — Inpatient Hospital Stay (HOSPITAL_BASED_OUTPATIENT_CLINIC_OR_DEPARTMENT_OTHER): Payer: Medicare Other | Admitting: Adult Health

## 2020-02-07 ENCOUNTER — Inpatient Hospital Stay: Payer: Medicare Other | Attending: Adult Health

## 2020-02-07 ENCOUNTER — Encounter: Payer: Self-pay | Admitting: Adult Health

## 2020-02-07 VITALS — BP 143/84 | HR 69 | Temp 98.0°F | Resp 18 | Wt 164.2 lb

## 2020-02-07 DIAGNOSIS — Z87891 Personal history of nicotine dependence: Secondary | ICD-10-CM | POA: Diagnosis not present

## 2020-02-07 DIAGNOSIS — Z79899 Other long term (current) drug therapy: Secondary | ICD-10-CM | POA: Insufficient documentation

## 2020-02-07 DIAGNOSIS — F419 Anxiety disorder, unspecified: Secondary | ICD-10-CM | POA: Diagnosis not present

## 2020-02-07 DIAGNOSIS — E785 Hyperlipidemia, unspecified: Secondary | ICD-10-CM | POA: Diagnosis not present

## 2020-02-07 DIAGNOSIS — G4700 Insomnia, unspecified: Secondary | ICD-10-CM | POA: Insufficient documentation

## 2020-02-07 DIAGNOSIS — Z9221 Personal history of antineoplastic chemotherapy: Secondary | ICD-10-CM | POA: Insufficient documentation

## 2020-02-07 DIAGNOSIS — D509 Iron deficiency anemia, unspecified: Secondary | ICD-10-CM | POA: Insufficient documentation

## 2020-02-07 DIAGNOSIS — Z171 Estrogen receptor negative status [ER-]: Secondary | ICD-10-CM | POA: Diagnosis not present

## 2020-02-07 DIAGNOSIS — C50412 Malignant neoplasm of upper-outer quadrant of left female breast: Secondary | ICD-10-CM

## 2020-02-07 DIAGNOSIS — R1310 Dysphagia, unspecified: Secondary | ICD-10-CM | POA: Diagnosis not present

## 2020-02-07 DIAGNOSIS — Z923 Personal history of irradiation: Secondary | ICD-10-CM | POA: Insufficient documentation

## 2020-02-07 DIAGNOSIS — E559 Vitamin D deficiency, unspecified: Secondary | ICD-10-CM | POA: Insufficient documentation

## 2020-02-07 DIAGNOSIS — K219 Gastro-esophageal reflux disease without esophagitis: Secondary | ICD-10-CM | POA: Insufficient documentation

## 2020-02-07 DIAGNOSIS — I471 Supraventricular tachycardia: Secondary | ICD-10-CM | POA: Diagnosis not present

## 2020-02-07 DIAGNOSIS — Z7982 Long term (current) use of aspirin: Secondary | ICD-10-CM | POA: Insufficient documentation

## 2020-02-07 DIAGNOSIS — R634 Abnormal weight loss: Secondary | ICD-10-CM | POA: Diagnosis not present

## 2020-02-07 DIAGNOSIS — R5383 Other fatigue: Secondary | ICD-10-CM | POA: Insufficient documentation

## 2020-02-07 DIAGNOSIS — Z853 Personal history of malignant neoplasm of breast: Secondary | ICD-10-CM | POA: Insufficient documentation

## 2020-02-07 LAB — COMPREHENSIVE METABOLIC PANEL
ALT: 19 U/L (ref 0–44)
AST: 17 U/L (ref 15–41)
Albumin: 4.1 g/dL (ref 3.5–5.0)
Alkaline Phosphatase: 62 U/L (ref 38–126)
Anion gap: 8 (ref 5–15)
BUN: 13 mg/dL (ref 8–23)
CO2: 29 mmol/L (ref 22–32)
Calcium: 9.2 mg/dL (ref 8.9–10.3)
Chloride: 98 mmol/L (ref 98–111)
Creatinine, Ser: 0.8 mg/dL (ref 0.44–1.00)
GFR calc Af Amer: >60 mL/min (ref >60)
GFR calc non Af Amer: >60 mL/min (ref >60)
Glucose, Bld: 114 mg/dL — ABNORMAL HIGH (ref 70–99)
Potassium: 3.8 mmol/L (ref 3.5–5.1)
Sodium: 135 mmol/L (ref 135–145)
Total Bilirubin: 0.7 mg/dL (ref 0.3–1.2)
Total Protein: 7.1 g/dL (ref 6.5–8.1)

## 2020-02-07 LAB — CBC WITH DIFFERENTIAL/PLATELET
Abs Immature Granulocytes: 0.01 10*3/uL (ref 0.00–0.07)
Basophils Absolute: 0 10*3/uL (ref 0.0–0.1)
Basophils Relative: 1 %
Eosinophils Absolute: 0.1 10*3/uL (ref 0.0–0.5)
Eosinophils Relative: 2 %
HCT: 40.8 % (ref 36.0–46.0)
Hemoglobin: 14.6 g/dL (ref 12.0–15.0)
Immature Granulocytes: 0 %
Lymphocytes Relative: 22 %
Lymphs Abs: 1.1 10*3/uL (ref 0.7–4.0)
MCH: 32.5 pg (ref 26.0–34.0)
MCHC: 35.8 g/dL (ref 30.0–36.0)
MCV: 90.9 fL (ref 80.0–100.0)
Monocytes Absolute: 0.3 10*3/uL (ref 0.1–1.0)
Monocytes Relative: 6 %
Neutro Abs: 3.7 10*3/uL (ref 1.7–7.7)
Neutrophils Relative %: 69 %
Platelets: 261 10*3/uL (ref 150–400)
RBC: 4.49 MIL/uL (ref 3.87–5.11)
RDW: 12 % (ref 11.5–15.5)
WBC: 5.3 10*3/uL (ref 4.0–10.5)
nRBC: 0 % (ref 0.0–0.2)

## 2020-02-07 NOTE — Patient Instructions (Signed)

## 2020-02-08 ENCOUNTER — Telehealth: Payer: Self-pay | Admitting: Adult Health

## 2020-02-08 NOTE — Telephone Encounter (Signed)
I left a message regarding schedule  

## 2020-02-14 ENCOUNTER — Encounter: Payer: Self-pay | Admitting: Oncology

## 2020-02-14 DIAGNOSIS — Z1231 Encounter for screening mammogram for malignant neoplasm of breast: Secondary | ICD-10-CM | POA: Diagnosis not present

## 2020-03-06 DIAGNOSIS — L570 Actinic keratosis: Secondary | ICD-10-CM | POA: Diagnosis not present

## 2020-03-06 DIAGNOSIS — L219 Seborrheic dermatitis, unspecified: Secondary | ICD-10-CM | POA: Diagnosis not present

## 2020-03-20 ENCOUNTER — Ambulatory Visit: Payer: Medicare Other | Admitting: Orthopaedic Surgery

## 2020-03-20 ENCOUNTER — Ambulatory Visit: Payer: Self-pay

## 2020-03-20 ENCOUNTER — Other Ambulatory Visit: Payer: Self-pay

## 2020-03-20 DIAGNOSIS — M1712 Unilateral primary osteoarthritis, left knee: Secondary | ICD-10-CM | POA: Diagnosis not present

## 2020-03-20 MED ORDER — BUPIVACAINE HCL 0.25 % IJ SOLN
2.0000 mL | INTRAMUSCULAR | Status: AC | PRN
  Administered 2020-03-20: 2 mL via INTRA_ARTICULAR

## 2020-03-20 MED ORDER — LIDOCAINE HCL 1 % IJ SOLN
2.0000 mL | INTRAMUSCULAR | Status: AC | PRN
  Administered 2020-03-20: 2 mL

## 2020-03-20 MED ORDER — METHYLPREDNISOLONE ACETATE 40 MG/ML IJ SUSP
40.0000 mg | INTRAMUSCULAR | Status: AC | PRN
  Administered 2020-03-20: 40 mg via INTRA_ARTICULAR

## 2020-03-20 NOTE — Progress Notes (Signed)
Office Visit Note   Patient: Sheri Arellano           Date of Birth: Aug 25, 1951           MRN: [id number] Visit Date: 03/20/2020              Requested by: Josetta Huddle, MD [address] [place],  [place] [address] PCP: Josetta Huddle, MD   Assessment & Plan: Visit Diagnoses:  1. Arthritis of left knee     Plan: Impression is left knee arthritis exacerbation.  We will inject the left knee with cortisone today.  She will call us if she does not have great relief and we will submit approval for viscosupplementation injection.  Follow-up with [place] as needed.  Follow-Up Instructions: Return if symptoms worsen or fail to improve.   Orders:  Orders Placed This Encounter  Procedures  . Large Joint Inj: L knee  . XR KNEE 3 VIEW LEFT   No orders of the defined types were placed in this encounter.     Procedures: Large Joint Inj: L knee on 03/20/2020 1:50 PM Indications: pain Details: 22 G needle, anterolateral approach Medications: 2 mL lidocaine 1 %; 2 mL bupivacaine 0.25 %; 40 mg methylPREDNISolone acetate 40 MG/ML      Clinical Data: No additional findings.   Subjective: Chief Complaint  Patient presents with  . Left Knee - Pain  . Left Ankle - Pain    HPI patient is a pleasant 69 year old female who comes in today with recurrent left knee pain.  History of left knee osteoarthritis.  She was seen by [place] in September 2020 for left knee gel injection and then again in December 2020 for cortisone injection.  She notes that these have both helped dull her pain.  She has recently noticed an exacerbation of pain which she thinks may have been contributed to spring cleaning.  The pain she has is to the medial aspect.  She denies any pain at rest.  The pain she has is primarily with walking as well as going from a seated to standing position as well as with kneeling.  She does not take any over-the-counter medications for this.  Review of Systems as detailed in  HPI.  All others reviewed and are negative.   Objective: Vital Signs: There were no vitals taken for this visit.  Physical Exam well-developed well-nourished female no acute distress.  Alert and oriented x3.  Ortho Exam examination of the left knee shows a trace effusion.  Range of motion 0 to 120 degrees.  Medial joint line tenderness.  Moderate patellofemoral crepitus.  She is neurovascular intact distally.  Specialty Comments:  No specialty comments available.  Imaging: No new imaging   PMFS History: Patient Active Problem List   Diagnosis Date Noted  . SVT (supraventricular tachycardia) ([facility]) 05/30/2015  . Malignant neoplasm of upper-outer quadrant of left breast in female, estrogen receptor negative ([facility]) 12/15/2013  . Rectal mucosa prolapse, anal skin tags 02/16/2012  . HYPERCHOLESTEROLEMIA 03/28/2009  . Anxiety state 03/28/2009  . SUPRAVENTRICULAR TACHYCARDIA 03/28/2009   Past Medical History:  Diagnosis Date  . Anxiety    probable panic attacks  . Cancer ([place] [facility])    breast  . Dysphagia   . Esophageal dysmotility   . GERD (gastroesophageal reflux disease)   . H/O hiatal hernia   . Hyperlipidemia   . Insomnia   . Iron deficiency anemia    hx  . Other malaise and fatigue   .  Radiation 06/05/14-07/18/14   left breast 50.4 gray, lumpectomy cavity boosted to 60.4 gray  . SVT (supraventricular tachycardia) ([facility])    documented-takes metoprolol as needed  . Vitamin D deficiency     Family History  Problem Relation Age of Onset  . Atrial fibrillation Mother 32  . Hyperlipidemia Mother   . Hypertension Mother   . Cancer Father 102       bladder  . Hypercholesterolemia Father   . Hypertension Father   . Cancer Maternal Aunt        breast  . Colon cancer Neg Hx   . CAD Neg Hx     Past Surgical History:  Procedure Laterality Date  . CARPAL TUNNEL RELEASE     lt and rt  . CHOLECYSTECTOMY  1999  . COLONOSCOPY WITH PROPOFOL N/A 03/20/2015   Procedure: COLONOSCOPY  WITH PROPOFOL;  Surgeon: Garlan Fair, MD;  [facility];  Service: Endoscopy;  Laterality: N/A;  . ELECTROPHYSIOLOGIC STUDY N/A 05/30/2015   Procedure: SVT Ablation;  Surgeon: Evans Lance, MD;  Location: [facility];  Service: Cardiovascular;  Laterality: N/A;  . PARTIAL MASTECTOMY WITH NEEDLE LOCALIZATION AND AXILLARY SENTINEL LYMPH NODE BX Left 01/16/2014   Procedure: PARTIAL MASTECTOMY WITH NEEDLE LOCALIZATION AND AXILLARY SENTINEL LYMPH NODE BIOPSY;  Surgeon: [name], MD;  Location: [facility];  Service: General;  Laterality: Left;  . PORT-A-CATH REMOVAL Right 08/23/2014   Procedure: MINOR REMOVAL PORT-A-CATH;  Surgeon: Fanny Skates, MD;  Location: [facility];  Service: General;  Laterality: Right;  . PORTACATH PLACEMENT Right 02/28/2014   Procedure: INSERTION PORT-A-CATH;  Surgeon: [name], MD;  Location: [facility];  Service: General;  Laterality: Right;   Social History   Occupational History  . Occupation: Architectural technologist: [employer]  Tobacco Use  . Smoking status: Former Smoker    Packs/day: 1.50    Years: 8.00    Pack years: 12.00    Types: Cigarettes    Quit date: 01/22/1976    Years since quitting: 44.1  . Smokeless tobacco: Never Used  Substance and Sexual Activity  . Alcohol use: Yes    Alcohol/week: 7.0 standard drinks    Types: 7 Shots of liquor per week    Comment: 1 vodka per day  . Drug use: No  . Sexual activity: Yes    Birth control/protection: Post-menopausal

## 2020-03-28 DIAGNOSIS — H2513 Age-related nuclear cataract, bilateral: Secondary | ICD-10-CM | POA: Diagnosis not present

## 2020-03-28 DIAGNOSIS — H16223 Keratoconjunctivitis sicca, not specified as Sjogren's, bilateral: Secondary | ICD-10-CM | POA: Diagnosis not present

## 2020-03-28 DIAGNOSIS — H5212 Myopia, left eye: Secondary | ICD-10-CM | POA: Diagnosis not present

## 2020-03-28 DIAGNOSIS — H1045 Other chronic allergic conjunctivitis: Secondary | ICD-10-CM | POA: Diagnosis not present

## 2020-05-03 DIAGNOSIS — L719 Rosacea, unspecified: Secondary | ICD-10-CM | POA: Diagnosis not present

## 2020-05-03 DIAGNOSIS — D225 Melanocytic nevi of trunk: Secondary | ICD-10-CM | POA: Diagnosis not present

## 2020-05-03 DIAGNOSIS — L821 Other seborrheic keratosis: Secondary | ICD-10-CM | POA: Diagnosis not present

## 2020-05-03 DIAGNOSIS — D2239 Melanocytic nevi of other parts of face: Secondary | ICD-10-CM | POA: Diagnosis not present

## 2020-05-29 ENCOUNTER — Ambulatory Visit: Payer: Medicare Other | Admitting: Orthopaedic Surgery

## 2020-05-29 ENCOUNTER — Encounter: Payer: Self-pay | Admitting: Orthopaedic Surgery

## 2020-05-29 ENCOUNTER — Ambulatory Visit: Payer: Self-pay

## 2020-05-29 DIAGNOSIS — M25551 Pain in right hip: Secondary | ICD-10-CM | POA: Insufficient documentation

## 2020-05-29 DIAGNOSIS — M25552 Pain in left hip: Secondary | ICD-10-CM | POA: Diagnosis not present

## 2020-05-29 MED ORDER — METHYLPREDNISOLONE 4 MG PO TBPK: ORAL_TABLET | ORAL | 0 refills | Status: DC

## 2020-05-29 NOTE — Progress Notes (Signed)
Office Visit Note   Patient: Sheri Arellano           Date of Birth: March 24, 1951           MRN: [id number] Visit Date: 05/29/2020              Requested by: Josetta Huddle, MD [address] [place],  [place] [address] PCP: Josetta Huddle, MD   Assessment & Plan: Visit Diagnoses:  1. Bilateral hip pain     Plan: Impression is bilateral hip pain worse on the left likely due to abductor tendinosis and recent weight gain.  I sent a prescription for Medrol Dosepak as well as provide her with home exercises for stretches.  We could always consider cortisone injections in the future if this does not improve.  Questions encouraged and answered.  Follow-up as needed.  Follow-Up Instructions: Return if symptoms worsen or fail to improve.   Orders:  Orders Placed This Encounter  Procedures  . XR Lumbar Spine 2-3 Views   Meds ordered this encounter  Medications  . methylPREDNISolone (MEDROL DOSEPAK) 4 MG TBPK tablet    Sig: Use as directed    Dispense:  21 tablet    Refill:  0      Procedures: No procedures performed   Clinical Data: No additional findings.   Subjective: Chief Complaint  Patient presents with  . Left Hip - Pain  . Right Hip - Pain    Pleas Koch returns today for bilateral hip pain worse on the left.  It is tender to touch and sleeping on that side.  Denies any radiation of pain and radiculopathy or groin pain.  Advil helps at night.  The pain is worse during activities.  Meloxicam does not help.  She has tried multiple mattresses without relief.   Review of Systems  Constitutional: Negative.   HENT: Negative.   Eyes: Negative.   Respiratory: Negative.   Cardiovascular: Negative.   Endocrine: Negative.   Musculoskeletal: Negative.   Neurological: Negative.   Hematological: Negative.   Psychiatric/Behavioral: Negative.   All other systems reviewed and are negative.    Objective: Vital Signs: There were no vitals taken for this visit.   Physical Exam Vitals and nursing note reviewed.  Constitutional:      Appearance: She is well-developed.  HENT:     Head: Normocephalic and atraumatic.  Pulmonary:     Effort: Pulmonary effort is normal.  Abdominal:     Palpations: Abdomen is soft.  Musculoskeletal:     Cervical back: Neck supple.  Skin:    General: Skin is warm.     Capillary Refill: Capillary refill takes less than 2 seconds.  Neurological:     Mental Status: She is alert and oriented to person, place, and time.  Psychiatric:        Behavior: Behavior normal.        Thought Content: Thought content normal.        Judgment: Judgment normal.     Ortho Exam Bilateral hip show tenderness laterally.  Good range of motion without pain in the groin.  Negative sciatic tension signs.  Low back is unchanged. Specialty Comments:  No specialty comments available.  Imaging: XR Lumbar Spine 2-3 Views  Result Date: 05/29/2020 Mild degenerative scoliosis    PMFS History: Patient Active Problem List   Diagnosis Date Noted  . Bilateral hip pain 05/29/2020  . SVT (supraventricular tachycardia) ([facility]) 05/30/2015  . Malignant neoplasm of upper-outer quadrant of  left breast in female, estrogen receptor negative ([facility]) 12/15/2013  . Rectal mucosa prolapse, anal skin tags 02/16/2012  . HYPERCHOLESTEROLEMIA 03/28/2009  . Anxiety state 03/28/2009  . SUPRAVENTRICULAR TACHYCARDIA 03/28/2009   Past Medical History:  Diagnosis Date  . Anxiety    probable panic attacks  . Cancer ([facility])    breast  . Dysphagia   . Esophageal dysmotility   . GERD (gastroesophageal reflux disease)   . H/O hiatal hernia   . Hyperlipidemia   . Insomnia   . Iron deficiency anemia    hx  . Other malaise and fatigue   . Radiation 06/05/14-07/18/14   left breast 50.4 gray, lumpectomy cavity boosted to 60.4 gray  . SVT (supraventricular tachycardia) ([facility])    documented-takes metoprolol as needed  . Vitamin D deficiency     Family History   Problem Relation Age of Onset  . Atrial fibrillation Mother 45  . Hyperlipidemia Mother   . Hypertension Mother   . Cancer Father 77       bladder  . Hypercholesterolemia Father   . Hypertension Father   . Cancer Maternal Aunt        breast  . Colon cancer Neg Hx   . CAD Neg Hx     Past Surgical History:  Procedure Laterality Date  . CARPAL TUNNEL RELEASE     lt and rt  . CHOLECYSTECTOMY  1999  . COLONOSCOPY WITH PROPOFOL N/A 03/20/2015   Procedure: COLONOSCOPY WITH PROPOFOL;  Surgeon: Garlan Fair, MD;  [facility];  Service: Endoscopy;  Laterality: N/A;  . ELECTROPHYSIOLOGIC STUDY N/A 05/30/2015   Procedure: SVT Ablation;  Surgeon: Evans Lance, MD;  Location: [facility];  Service: Cardiovascular;  Laterality: N/A;  . PARTIAL MASTECTOMY WITH NEEDLE LOCALIZATION AND AXILLARY SENTINEL LYMPH NODE BX Left 01/16/2014   Procedure: PARTIAL MASTECTOMY WITH NEEDLE LOCALIZATION AND AXILLARY SENTINEL LYMPH NODE BIOPSY;  Surgeon: [name], MD;  Location: [facility];  Service: General;  Laterality: Left;  . PORT-A-CATH REMOVAL Right 08/23/2014   Procedure: MINOR REMOVAL PORT-A-CATH;  Surgeon: Fanny Skates, MD;  Location: [facility];  Service: General;  Laterality: Right;  . PORTACATH PLACEMENT Right 02/28/2014   Procedure: INSERTION PORT-A-CATH;  Surgeon: [name], MD;  Location: [facility];  Service: General;  Laterality: Right;   Social History   Occupational History  . Occupation: Architectural technologist: [employer]  Tobacco Use  . Smoking status: Former Smoker    Packs/day: 1.50    Years: 8.00    Pack years: 12.00    Types: Cigarettes    Quit date: 01/22/1976    Years since quitting: 44.3  . Smokeless tobacco: Never Used  Vaping Use  . Vaping Use: Never used  Substance and Sexual Activity  . Alcohol use: Yes    Alcohol/week: 7.0 standard drinks    Types: 7 Shots of liquor per week    Comment: 1 vodka per day   . Drug use: No  . Sexual activity: Yes    Birth control/protection: Post-menopausal

## 2020-07-25 ENCOUNTER — Ambulatory Visit: Payer: Medicare Other | Admitting: Orthopaedic Surgery

## 2020-07-25 ENCOUNTER — Ambulatory Visit: Payer: Self-pay

## 2020-07-25 DIAGNOSIS — M76892 Other specified enthesopathies of left lower limb, excluding foot: Secondary | ICD-10-CM

## 2020-07-25 DIAGNOSIS — N941 Unspecified dyspareunia: Secondary | ICD-10-CM | POA: Insufficient documentation

## 2020-07-25 DIAGNOSIS — N898 Other specified noninflammatory disorders of vagina: Secondary | ICD-10-CM | POA: Insufficient documentation

## 2020-07-25 DIAGNOSIS — F1990 Other psychoactive substance use, unspecified, uncomplicated: Secondary | ICD-10-CM | POA: Insufficient documentation

## 2020-07-25 DIAGNOSIS — R000 Tachycardia, unspecified: Secondary | ICD-10-CM | POA: Insufficient documentation

## 2020-07-25 DIAGNOSIS — G4700 Insomnia, unspecified: Secondary | ICD-10-CM | POA: Insufficient documentation

## 2020-07-25 DIAGNOSIS — M76891 Other specified enthesopathies of right lower limb, excluding foot: Secondary | ICD-10-CM

## 2020-07-25 DIAGNOSIS — K219 Gastro-esophageal reflux disease without esophagitis: Secondary | ICD-10-CM | POA: Insufficient documentation

## 2020-07-25 DIAGNOSIS — D509 Iron deficiency anemia, unspecified: Secondary | ICD-10-CM | POA: Insufficient documentation

## 2020-07-25 DIAGNOSIS — E782 Mixed hyperlipidemia: Secondary | ICD-10-CM | POA: Insufficient documentation

## 2020-07-25 DIAGNOSIS — R5383 Other fatigue: Secondary | ICD-10-CM | POA: Insufficient documentation

## 2020-07-25 DIAGNOSIS — E559 Vitamin D deficiency, unspecified: Secondary | ICD-10-CM | POA: Insufficient documentation

## 2020-07-25 DIAGNOSIS — R05 Cough: Secondary | ICD-10-CM | POA: Insufficient documentation

## 2020-07-25 DIAGNOSIS — R059 Cough, unspecified: Secondary | ICD-10-CM | POA: Insufficient documentation

## 2020-07-25 NOTE — Progress Notes (Signed)
S:  Here for bilateral hip injections.  O:  Tender over both greater trochanters.  P:  Ultrasound-guided bilateral GT injections:  After sterile prep with betadine, injected 5 cc 1% lido without epi and 40 mg methylprednisolone into the gluteus medius tendons at the greater trochanter.

## 2020-07-25 NOTE — Progress Notes (Signed)
Office Visit Note   Patient: Sheri Arellano           Date of Birth: January 28, 1951           MRN: [id number] Visit Date: 07/25/2020              Requested by: Josetta Huddle, MD [address] [place],  [place] [address] PCP: Josetta Huddle, MD   Assessment & Plan: Visit Diagnoses:  1. Hip abductor tendonitis, left   2. Hip abductor tendinitis, right     Plan: Impression is bilateral hip abductor tendinitis vs trochanteric bursitis.  We will refer the patient to Dr. Junius Roads for ultrasound-guided cortisone injections.  Have also made an external referral for physical therapy.  She will call us in the next week if she has not heard back from them.  Follow-up with [place] as needed.  Follow-Up Instructions: Return for prn.   Orders:  Orders Placed This Encounter  Procedures  . US Guided Needle Placement - No Linked Charges  . Ambulatory referral to Physical Therapy   No orders of the defined types were placed in this encounter.     Procedures: No procedures performed   Clinical Data: No additional findings.   Subjective: Chief Complaint  Patient presents with  . Left Hip - Pain  . Right Hip - Pain    HPI patient is a pleasant 69 year old female who presents to our clinic today with recurrent bilateral hip pain both equally as bad.  She has been dealing with this for about 4 months.  She was seen in our office in June of this year where she was diagnosed with hip abductor tendinitis.  She was provided with a steroid pack and stretches.  She noted minimal relief with this treatment.  She continues to have pain to the lateral aspect of both hips that radiates down the mid upper leg.  Lying on her side as well as when she is walking a lot seems to aggravate her symptoms.  She has tried Advil without significant relief of symptoms.  Review of Systems as detailed in HPI.  All others reviewed and are negative.   Objective: Vital Signs: There were no vitals taken for this  visit.  Physical Exam well-developed well-nourished female no acute distress.  Alert and oriented x3.  Ortho Exam examination of both hips reveals moderate tenderness to the hip abductor and greater trochanter both sides. Negative logroll negative straight leg raise both sides.  She is neurovascular intact distally.  Specialty Comments:  No specialty comments available.  Imaging: No new imaging   PMFS History: Patient Active Problem List   Diagnosis Date Noted  . Acid reflux 07/25/2020  . Anemia, iron deficiency 07/25/2020  . Avitaminosis D 07/25/2020  . Cannot sleep 07/25/2020  . Combined fat and carbohydrate induced hyperlipemia 07/25/2020  . Cough 07/25/2020  . Current drug use 07/25/2020  . Discharge from the vagina 07/25/2020  . Fast heart beat 07/25/2020  . Fatigue 07/25/2020  . Pain in female genitalia on intercourse 07/25/2020  . Bilateral hip pain 05/29/2020  . SVT (supraventricular tachycardia) ([facility]) 05/30/2015  . Atrophic vaginitis 07/26/2014  . Hemorrhoids without complication 82/95/6213  . Malignant neoplasm of upper-outer quadrant of left breast in female, estrogen receptor negative ([facility]) 12/15/2013  . Environmental allergies 05/21/2012  . Rectal mucosa prolapse, anal skin tags 02/16/2012  . HYPERCHOLESTEROLEMIA 03/28/2009  . Anxiety state 03/28/2009  . SUPRAVENTRICULAR TACHYCARDIA 03/28/2009   Past Medical History:  Diagnosis  Date  . Anxiety    probable panic attacks  . Cancer ([facility])    breast  . Dysphagia   . Esophageal dysmotility   . GERD (gastroesophageal reflux disease)   . H/O hiatal hernia   . Hyperlipidemia   . Insomnia   . Iron deficiency anemia    hx  . Other malaise and fatigue   . Radiation 06/05/14-07/18/14   left breast 50.4 gray, lumpectomy cavity boosted to 60.4 gray  . SVT (supraventricular tachycardia) ([facility])    documented-takes metoprolol as needed  . Vitamin D deficiency     Family History  Problem Relation Age of Onset  .  Atrial fibrillation Mother 61  . Hyperlipidemia Mother   . Hypertension Mother   . Cancer Father 57       bladder  . Hypercholesterolemia Father   . Hypertension Father   . Cancer Maternal Aunt        breast  . Colon cancer Neg Hx   . CAD Neg Hx     Past Surgical History:  Procedure Laterality Date  . CARPAL TUNNEL RELEASE     lt and rt  . CHOLECYSTECTOMY  1999  . COLONOSCOPY WITH PROPOFOL N/A 03/20/2015   Procedure: COLONOSCOPY WITH PROPOFOL;  Surgeon: Garlan Fair, MD;  [facility];  Service: Endoscopy;  Laterality: N/A;  . ELECTROPHYSIOLOGIC STUDY N/A 05/30/2015   Procedure: SVT Ablation;  Surgeon: Evans Lance, MD;  Location: [facility];  Service: Cardiovascular;  Laterality: N/A;  . PARTIAL MASTECTOMY WITH NEEDLE LOCALIZATION AND AXILLARY SENTINEL LYMPH NODE BX Left 01/16/2014   Procedure: PARTIAL MASTECTOMY WITH NEEDLE LOCALIZATION AND AXILLARY SENTINEL LYMPH NODE BIOPSY;  Surgeon: [name], MD;  Location: [facility];  Service: General;  Laterality: Left;  . PORT-A-CATH REMOVAL Right 08/23/2014   Procedure: MINOR REMOVAL PORT-A-CATH;  Surgeon: Fanny Skates, MD;  Location: [facility];  Service: General;  Laterality: Right;  . PORTACATH PLACEMENT Right 02/28/2014   Procedure: INSERTION PORT-A-CATH;  Surgeon: [name], MD;  Location: [facility];  Service: General;  Laterality: Right;   Social History   Occupational History  . Occupation: Architectural technologist: [employer]  Tobacco Use  . Smoking status: Former Smoker    Packs/day: 1.50    Years: 8.00    Pack years: 12.00    Types: Cigarettes    Quit date: 01/22/1976    Years since quitting: 44.5  . Smokeless tobacco: Never Used  Vaping Use  . Vaping Use: Never used  Substance and Sexual Activity  . Alcohol use: Yes    Alcohol/week: 7.0 standard drinks    Types: 7 Shots of liquor per week    Comment: 1 vodka per day  . Drug use: No  . Sexual  activity: Yes    Birth control/protection: Post-menopausal

## 2020-07-26 ENCOUNTER — Telehealth: Payer: Self-pay | Admitting: Physician Assistant

## 2020-07-26 NOTE — Telephone Encounter (Signed)
Called patient no answer. LMOM. Which Rx would she prefer.  (Tramadol or Tylenol #3?)

## 2020-07-26 NOTE — Telephone Encounter (Signed)
We can call in tramadol or tylenol 3

## 2020-07-26 NOTE — Telephone Encounter (Signed)
Patient called.   She is requesting a call back to discuss getting set up with some kind of pain medication.  Call back: [phone]

## 2020-07-26 NOTE — Telephone Encounter (Signed)
&   which pharm?

## 2020-07-27 ENCOUNTER — Other Ambulatory Visit: Payer: Self-pay | Admitting: Physician Assistant

## 2020-07-27 ENCOUNTER — Telehealth: Payer: Self-pay | Admitting: Physician Assistant

## 2020-07-27 MED ORDER — TRAMADOL HCL 50 MG PO TABS: 50.0000 mg | ORAL_TABLET | Freq: Two times a day (BID) | ORAL | 1 refills | Status: DC | PRN

## 2020-07-27 NOTE — Telephone Encounter (Signed)
What pharmacy ?

## 2020-07-27 NOTE — Telephone Encounter (Signed)
Can this be called into her pharm.

## 2020-07-27 NOTE — Telephone Encounter (Signed)
Pt called stating her rx was supposed to go to the [facility], she would like to have it switched.  [phone]

## 2020-07-27 NOTE — Telephone Encounter (Signed)
See other msg

## 2020-07-27 NOTE — Telephone Encounter (Signed)
Called and left a VM advising patient to call back with the correct pharmacy to send Rx to.

## 2020-07-27 NOTE — Telephone Encounter (Signed)
Patient called advised she would like Tramadol called into the pharmacy. The name of the pharmacy is [facility]  [address]  [place] [address]    Ph# [phone]    The number to contact patient is [phone]

## 2020-07-27 NOTE — Telephone Encounter (Signed)
Called patient no answer LMOM.

## 2020-07-27 NOTE — Telephone Encounter (Signed)
Sent in

## 2020-08-07 ENCOUNTER — Telehealth: Payer: Self-pay | Admitting: Physician Assistant

## 2020-08-07 NOTE — Telephone Encounter (Signed)
Ok to refill with same Sheri Arellano

## 2020-08-07 NOTE — Telephone Encounter (Signed)
Patient called requesting refill of remaining tramadol refill. 16 count. 14 was released to other pharmacy when patient was out of town. Patient would like PA Stanbery to please call patient about this matter. Patient phone number is [phone].

## 2020-08-08 ENCOUNTER — Other Ambulatory Visit: Payer: Self-pay

## 2020-08-08 NOTE — Telephone Encounter (Signed)
Patient called  Patient needs prescription to be refilled prior to last message she left, patient is going out of town and needs them ASAP.  Call back:[phone]

## 2020-08-09 ENCOUNTER — Other Ambulatory Visit: Payer: Self-pay

## 2020-08-09 MED ORDER — TRAMADOL HCL 50 MG PO TABS: 50.0000 mg | ORAL_TABLET | Freq: Two times a day (BID) | ORAL | 0 refills | Status: DC | PRN

## 2020-08-09 NOTE — Telephone Encounter (Signed)
Called into pharm(left on voicemail).  Also called patient to let her know no answer. LMOM with details.

## 2020-09-05 MED FILL — FLUVOXAMINE MALEATE 50 MG T: 50 | 14 days supply | Qty: 28 | Fill #0

## 2020-09-18 ENCOUNTER — Telehealth: Payer: Self-pay | Admitting: Family Medicine

## 2020-09-18 NOTE — Telephone Encounter (Signed)
Amy from [facility] called. Says they need a new referral for patient to start PT. Her call back number is [phone]. Mendel Ryder had referred her in August.

## 2020-09-18 NOTE — Telephone Encounter (Signed)
Okay for new PT referral?

## 2020-09-19 ENCOUNTER — Other Ambulatory Visit: Payer: Self-pay

## 2020-09-19 DIAGNOSIS — M76891 Other specified enthesopathies of right lower limb, excluding foot: Secondary | ICD-10-CM

## 2020-09-19 DIAGNOSIS — M76892 Other specified enthesopathies of left lower limb, excluding foot: Secondary | ICD-10-CM

## 2020-09-19 NOTE — Telephone Encounter (Signed)
Ok to do thanks. 

## 2020-09-19 NOTE — Telephone Encounter (Signed)
New referral made

## 2020-09-26 ENCOUNTER — Telehealth: Payer: Self-pay | Admitting: Physician Assistant

## 2020-09-26 NOTE — Telephone Encounter (Signed)
Order was made. Can we check status of this?

## 2020-09-26 NOTE — Telephone Encounter (Signed)
Patient called requesting a call back from [facility] concerning her physical therapy. Please call patient at [phone].

## 2020-09-28 NOTE — Telephone Encounter (Signed)
Referral was faxed to [facility], they have been trying to contact pt to schedule

## 2020-10-01 ENCOUNTER — Telehealth: Payer: Self-pay

## 2020-10-01 NOTE — Telephone Encounter (Signed)
Patient would like a call back from [facility].  Cb# [phone]. Please advise.  Thank you.

## 2020-10-01 NOTE — Telephone Encounter (Signed)
Please call.

## 2020-10-02 ENCOUNTER — Ambulatory Visit: Payer: Self-pay

## 2020-10-02 ENCOUNTER — Other Ambulatory Visit: Payer: Self-pay

## 2020-10-02 ENCOUNTER — Encounter: Payer: Self-pay | Admitting: Orthopaedic Surgery

## 2020-10-02 ENCOUNTER — Ambulatory Visit: Payer: Medicare Other | Admitting: Orthopaedic Surgery

## 2020-10-02 VITALS — Ht 62.5 in

## 2020-10-02 DIAGNOSIS — M25511 Pain in right shoulder: Secondary | ICD-10-CM | POA: Diagnosis not present

## 2020-10-02 DIAGNOSIS — G8929 Other chronic pain: Secondary | ICD-10-CM

## 2020-10-02 MED ORDER — BUPIVACAINE HCL 0.5 % IJ SOLN
2.0000 mL | INTRAMUSCULAR | Status: AC | PRN
  Administered 2020-10-02: 2 mL

## 2020-10-02 MED ORDER — LIDOCAINE HCL 1 % IJ SOLN
2.0000 mL | INTRAMUSCULAR | Status: AC | PRN
  Administered 2020-10-02: 2 mL

## 2020-10-02 MED ORDER — METHYLPREDNISOLONE ACETATE 40 MG/ML IJ SUSP
40.0000 mg | INTRAMUSCULAR | Status: AC | PRN
  Administered 2020-10-02: 40 mg via INTRAMUSCULAR

## 2020-10-02 MED ORDER — TRAMADOL HCL 50 MG PO TABS
50.0000 mg | ORAL_TABLET | Freq: Every evening | ORAL | 0 refills | Status: DC | PRN
Start: 2020-10-02 — End: 2021-04-09

## 2020-10-02 MED ORDER — DICLOFENAC SODIUM 75 MG PO TBEC: 75.0000 mg | DELAYED_RELEASE_TABLET | Freq: Two times a day (BID) | ORAL | 2 refills | Status: DC

## 2020-10-02 MED ORDER — TRAMADOL HCL 50 MG PO TABS
50.0000 mg | ORAL_TABLET | Freq: Every evening | ORAL | 0 refills | Status: DC | PRN
Start: 2020-10-02 — End: 2020-10-02

## 2020-10-02 NOTE — Telephone Encounter (Signed)
Was going to call at lunch but looks like she came in this am

## 2020-10-02 NOTE — Progress Notes (Signed)
Office Visit Note   Patient: Sheri Arellano           Date of Birth: [birth year]           MRN: [id number] Visit Date: 10/02/2020              Requested by: Josetta Huddle, MD [address] [place],  [place] [address] PCP: Josetta Huddle, MD   Assessment & Plan: Visit Diagnoses:  1. Chronic right shoulder pain     Plan: Impression is a right scapular trigger point myofascial syndrome.  I recommend outpatient PT at [facility] per her request.  Dry needling recommended.  Scapular trigger point injection performed today.  Home exercises and maneuvers demonstrated.  Prescription for diclofenac as well as tramadol.  Follow-up as needed.  Follow-Up Instructions: Return if symptoms worsen or fail to improve.   Orders:  Orders Placed This Encounter  Procedures  . XR Cervical Spine 2 or 3 views   Meds ordered this encounter  Medications  . DISCONTD: traMADol (ULTRAM) 50 MG tablet    Sig: Take 1 tablet (50 mg total) by mouth at bedtime as needed.    Dispense:  30 tablet    Refill:  0  . diclofenac (VOLTAREN) 75 MG EC tablet    Sig: Take 1 tablet (75 mg total) by mouth 2 (two) times daily.    Dispense:  30 tablet    Refill:  2  . traMADol (ULTRAM) 50 MG tablet    Sig: Take 1 tablet (50 mg total) by mouth at bedtime as needed.    Dispense:  30 tablet    Refill:  0      Procedures: Trigger Point Inj  Date/Time: 10/02/2020 11:07 AM Performed by: Leandrew Koyanagi, MD Authorized by: Leandrew Koyanagi, MD   Consent Given by:  Patient Indications:  Muscle spasm and pain Total # of Trigger Points:  1 Location: shoulder   Needle Size:  25 G Approach:  Dorsal Medications #1:  40 mg methylPREDNISolone acetate 40 MG/ML; 2 mL bupivacaine 0.5 %; 2 mL lidocaine 1 % Patient tolerance:  Patient tolerated the procedure well with no immediate complications     Clinical Data: No additional findings.   Subjective: Chief Complaint  Patient presents with  . Right Shoulder -  Pain    Sheri Arellano is coming in for evaluation of right shoulder pain with occasional numbness and tingling in the fingers.  Mainly at radiates from the side of her neck down into the medial scapular border region.  She does admit to being under stress at times.  Denies any injuries.  The pain is worse with laying on her right side.   Review of Systems  Constitutional: Negative.   HENT: Negative.   Eyes: Negative.   Respiratory: Negative.   Cardiovascular: Negative.   Endocrine: Negative.   Musculoskeletal: Negative.   Neurological: Negative.   Hematological: Negative.   Psychiatric/Behavioral: Negative.   All other systems reviewed and are negative.    Objective: Vital Signs: Ht 5' 2.5" (1.588 m)   BMI 29.55 kg/m   Physical Exam Vitals and nursing note reviewed.  Constitutional:      Appearance: She is well-developed.  Pulmonary:     Effort: Pulmonary effort is normal.  Skin:    General: Skin is warm.     Capillary Refill: Capillary refill takes less than 2 seconds.  Neurological:     Mental Status: She is alert and oriented to  person, place, and time.  Psychiatric:        Behavior: Behavior normal.        Thought Content: Thought content normal.        Judgment: Judgment normal.     Ortho Exam Right shoulder shows full active and passive range of motion without pain.  Manual muscle testing is normal for strength.  She has 1 area in the rhomboids and it is tender to palpation.  Negative Spurling's. Specialty Comments:  No specialty comments available.  Imaging: No results found.   PMFS History: Patient Active Problem List   Diagnosis Date Noted  . Acid reflux 07/25/2020  . Anemia, iron deficiency 07/25/2020  . Avitaminosis D 07/25/2020  . Cannot sleep 07/25/2020  . Combined fat and carbohydrate induced hyperlipemia 07/25/2020  . Cough 07/25/2020  . Current drug use 07/25/2020  . Discharge from the vagina 07/25/2020  . Fast heart beat 07/25/2020  .  Fatigue 07/25/2020  . Pain in female genitalia on intercourse 07/25/2020  . Bilateral hip pain 05/29/2020  . SVT (supraventricular tachycardia) ([facility]) 05/30/2015  . Atrophic vaginitis 07/26/2014  . Hemorrhoids without complication 89/38/1017  . Malignant neoplasm of upper-outer quadrant of left breast in female, estrogen receptor negative ([facility]) 12/15/2013  . Environmental allergies 05/21/2012  . Rectal mucosa prolapse, anal skin tags 02/16/2012  . HYPERCHOLESTEROLEMIA 03/28/2009  . Anxiety state 03/28/2009  . SUPRAVENTRICULAR TACHYCARDIA 03/28/2009   Past Medical History:  Diagnosis Date  . Anxiety    probable panic attacks  . Cancer ([place])    breast  . Dysphagia   . Esophageal dysmotility   . GERD (gastroesophageal reflux disease)   . H/O hiatal hernia   . Hyperlipidemia   . Insomnia   . Iron deficiency anemia    hx  . Other malaise and fatigue   . Radiation 06/05/14-07/18/14   left breast 50.4 gray, lumpectomy cavity boosted to 60.4 gray  . SVT (supraventricular tachycardia) ([facility])    documented-takes metoprolol as needed  . Vitamin D deficiency     Family History  Problem Relation Age of Onset  . Atrial fibrillation Mother 85  . Hyperlipidemia Mother   . Hypertension Mother   . Cancer Father 70       bladder  . Hypercholesterolemia Father   . Hypertension Father   . Cancer Maternal Aunt        breast  . Colon cancer Neg Hx   . CAD Neg Hx     Past Surgical History:  Procedure Laterality Date  . CARPAL TUNNEL RELEASE     lt and rt  . CHOLECYSTECTOMY  1999  . COLONOSCOPY WITH PROPOFOL N/A 03/20/2015   Procedure: COLONOSCOPY WITH PROPOFOL;  Surgeon: Garlan Fair, MD;  [facility];  Service: Endoscopy;  Laterality: N/A;  . ELECTROPHYSIOLOGIC STUDY N/A 05/30/2015   Procedure: SVT Ablation;  Surgeon: Evans Lance, MD;  Location: [facility];  Service: Cardiovascular;  Laterality: N/A;  . PARTIAL MASTECTOMY WITH NEEDLE LOCALIZATION AND AXILLARY  SENTINEL LYMPH NODE BX Left 01/16/2014   Procedure: PARTIAL MASTECTOMY WITH NEEDLE LOCALIZATION AND AXILLARY SENTINEL LYMPH NODE BIOPSY;  Surgeon: [name], MD;  Location: [facility];  Service: General;  Laterality: Left;  . PORT-A-CATH REMOVAL Right 08/23/2014   Procedure: MINOR REMOVAL PORT-A-CATH;  Surgeon: Fanny Skates, MD;  Location: [facility];  Service: General;  Laterality: Right;  . PORTACATH PLACEMENT Right 02/28/2014   Procedure: INSERTION PORT-A-CATH;  Surgeon: Edsel Petrin  Dalbert Batman, MD;  Location: [facility];  Service: General;  Laterality: Right;   Social History   Occupational History  . Occupation: Architectural technologist: [employer]  Tobacco Use  . Smoking status: Former Smoker    Packs/day: 1.50    Years: 8.00    Pack years: 12.00    Types: Cigarettes    Quit date: 01/22/1976    Years since quitting: 44.7  . Smokeless tobacco: Never Used  Vaping Use  . Vaping Use: Never used  Substance and Sexual Activity  . Alcohol use: Yes    Alcohol/week: 7.0 standard drinks    Types: 7 Shots of liquor per week    Comment: 1 vodka per day  . Drug use: No  . Sexual activity: Yes    Birth control/protection: Post-menopausal

## 2020-10-04 DIAGNOSIS — M25511 Pain in right shoulder: Secondary | ICD-10-CM | POA: Diagnosis not present

## 2020-10-04 DIAGNOSIS — M25552 Pain in left hip: Secondary | ICD-10-CM | POA: Diagnosis not present

## 2020-10-04 DIAGNOSIS — M25551 Pain in right hip: Secondary | ICD-10-CM | POA: Diagnosis not present

## 2020-10-04 DIAGNOSIS — M5459 Other low back pain: Secondary | ICD-10-CM | POA: Diagnosis not present

## 2020-10-08 DIAGNOSIS — M25551 Pain in right hip: Secondary | ICD-10-CM | POA: Diagnosis not present

## 2020-10-08 DIAGNOSIS — M25511 Pain in right shoulder: Secondary | ICD-10-CM | POA: Diagnosis not present

## 2020-10-08 DIAGNOSIS — M25552 Pain in left hip: Secondary | ICD-10-CM | POA: Diagnosis not present

## 2020-10-08 DIAGNOSIS — M5459 Other low back pain: Secondary | ICD-10-CM | POA: Diagnosis not present

## 2020-10-10 DIAGNOSIS — M5459 Other low back pain: Secondary | ICD-10-CM | POA: Diagnosis not present

## 2020-10-10 DIAGNOSIS — M25511 Pain in right shoulder: Secondary | ICD-10-CM | POA: Diagnosis not present

## 2020-10-10 DIAGNOSIS — M25552 Pain in left hip: Secondary | ICD-10-CM | POA: Diagnosis not present

## 2020-10-10 DIAGNOSIS — M25551 Pain in right hip: Secondary | ICD-10-CM | POA: Diagnosis not present

## 2020-10-11 DIAGNOSIS — M25511 Pain in right shoulder: Secondary | ICD-10-CM | POA: Diagnosis not present

## 2020-10-11 DIAGNOSIS — M5459 Other low back pain: Secondary | ICD-10-CM | POA: Diagnosis not present

## 2020-10-11 DIAGNOSIS — M25552 Pain in left hip: Secondary | ICD-10-CM | POA: Diagnosis not present

## 2020-10-11 DIAGNOSIS — M25551 Pain in right hip: Secondary | ICD-10-CM | POA: Diagnosis not present

## 2020-10-15 DIAGNOSIS — M5459 Other low back pain: Secondary | ICD-10-CM | POA: Diagnosis not present

## 2020-10-15 DIAGNOSIS — M25551 Pain in right hip: Secondary | ICD-10-CM | POA: Diagnosis not present

## 2020-10-15 DIAGNOSIS — M25511 Pain in right shoulder: Secondary | ICD-10-CM | POA: Diagnosis not present

## 2020-10-15 DIAGNOSIS — M25552 Pain in left hip: Secondary | ICD-10-CM | POA: Diagnosis not present

## 2020-10-16 DIAGNOSIS — M25552 Pain in left hip: Secondary | ICD-10-CM | POA: Diagnosis not present

## 2020-10-16 DIAGNOSIS — M5459 Other low back pain: Secondary | ICD-10-CM | POA: Diagnosis not present

## 2020-10-16 DIAGNOSIS — M25511 Pain in right shoulder: Secondary | ICD-10-CM | POA: Diagnosis not present

## 2020-10-16 DIAGNOSIS — M25551 Pain in right hip: Secondary | ICD-10-CM | POA: Diagnosis not present

## 2020-10-18 DIAGNOSIS — M5459 Other low back pain: Secondary | ICD-10-CM | POA: Diagnosis not present

## 2020-10-18 DIAGNOSIS — M25551 Pain in right hip: Secondary | ICD-10-CM | POA: Diagnosis not present

## 2020-10-18 DIAGNOSIS — M25511 Pain in right shoulder: Secondary | ICD-10-CM | POA: Diagnosis not present

## 2020-10-18 DIAGNOSIS — M25552 Pain in left hip: Secondary | ICD-10-CM | POA: Diagnosis not present

## 2020-10-22 DIAGNOSIS — M25551 Pain in right hip: Secondary | ICD-10-CM | POA: Diagnosis not present

## 2020-10-22 DIAGNOSIS — M5459 Other low back pain: Secondary | ICD-10-CM | POA: Diagnosis not present

## 2020-10-22 DIAGNOSIS — M25511 Pain in right shoulder: Secondary | ICD-10-CM | POA: Diagnosis not present

## 2020-10-22 DIAGNOSIS — M25552 Pain in left hip: Secondary | ICD-10-CM | POA: Diagnosis not present

## 2020-10-26 DIAGNOSIS — M5459 Other low back pain: Secondary | ICD-10-CM | POA: Diagnosis not present

## 2020-10-26 DIAGNOSIS — M25552 Pain in left hip: Secondary | ICD-10-CM | POA: Diagnosis not present

## 2020-10-26 DIAGNOSIS — M25511 Pain in right shoulder: Secondary | ICD-10-CM | POA: Diagnosis not present

## 2020-10-26 DIAGNOSIS — M25551 Pain in right hip: Secondary | ICD-10-CM | POA: Diagnosis not present

## 2020-10-30 DIAGNOSIS — M25551 Pain in right hip: Secondary | ICD-10-CM | POA: Diagnosis not present

## 2020-10-30 DIAGNOSIS — M5459 Other low back pain: Secondary | ICD-10-CM | POA: Diagnosis not present

## 2020-10-30 DIAGNOSIS — M25511 Pain in right shoulder: Secondary | ICD-10-CM | POA: Diagnosis not present

## 2020-10-30 DIAGNOSIS — M25552 Pain in left hip: Secondary | ICD-10-CM | POA: Diagnosis not present

## 2020-11-06 DIAGNOSIS — M5459 Other low back pain: Secondary | ICD-10-CM | POA: Diagnosis not present

## 2020-11-06 DIAGNOSIS — M25511 Pain in right shoulder: Secondary | ICD-10-CM | POA: Diagnosis not present

## 2020-11-06 DIAGNOSIS — M25552 Pain in left hip: Secondary | ICD-10-CM | POA: Diagnosis not present

## 2020-11-06 DIAGNOSIS — M25551 Pain in right hip: Secondary | ICD-10-CM | POA: Diagnosis not present

## 2020-11-08 DIAGNOSIS — M25551 Pain in right hip: Secondary | ICD-10-CM | POA: Diagnosis not present

## 2020-11-08 DIAGNOSIS — M5459 Other low back pain: Secondary | ICD-10-CM | POA: Diagnosis not present

## 2020-11-08 DIAGNOSIS — M25552 Pain in left hip: Secondary | ICD-10-CM | POA: Diagnosis not present

## 2020-11-08 DIAGNOSIS — M25511 Pain in right shoulder: Secondary | ICD-10-CM | POA: Diagnosis not present

## 2020-11-13 DIAGNOSIS — M5459 Other low back pain: Secondary | ICD-10-CM | POA: Diagnosis not present

## 2020-11-13 DIAGNOSIS — M25511 Pain in right shoulder: Secondary | ICD-10-CM | POA: Diagnosis not present

## 2020-11-13 DIAGNOSIS — M25551 Pain in right hip: Secondary | ICD-10-CM | POA: Diagnosis not present

## 2020-11-13 DIAGNOSIS — M25552 Pain in left hip: Secondary | ICD-10-CM | POA: Diagnosis not present

## 2020-11-16 DIAGNOSIS — M25551 Pain in right hip: Secondary | ICD-10-CM | POA: Diagnosis not present

## 2020-11-16 DIAGNOSIS — M25552 Pain in left hip: Secondary | ICD-10-CM | POA: Diagnosis not present

## 2020-11-16 DIAGNOSIS — M25511 Pain in right shoulder: Secondary | ICD-10-CM | POA: Diagnosis not present

## 2020-11-16 DIAGNOSIS — M5459 Other low back pain: Secondary | ICD-10-CM | POA: Diagnosis not present

## 2020-11-22 DIAGNOSIS — M5459 Other low back pain: Secondary | ICD-10-CM | POA: Diagnosis not present

## 2020-11-22 DIAGNOSIS — M25552 Pain in left hip: Secondary | ICD-10-CM | POA: Diagnosis not present

## 2020-11-22 DIAGNOSIS — M25511 Pain in right shoulder: Secondary | ICD-10-CM | POA: Diagnosis not present

## 2020-11-22 DIAGNOSIS — M25551 Pain in right hip: Secondary | ICD-10-CM | POA: Diagnosis not present

## 2020-11-23 DIAGNOSIS — M25551 Pain in right hip: Secondary | ICD-10-CM | POA: Diagnosis not present

## 2020-11-23 DIAGNOSIS — M5459 Other low back pain: Secondary | ICD-10-CM | POA: Diagnosis not present

## 2020-11-23 DIAGNOSIS — M25511 Pain in right shoulder: Secondary | ICD-10-CM | POA: Diagnosis not present

## 2020-11-23 DIAGNOSIS — M25552 Pain in left hip: Secondary | ICD-10-CM | POA: Diagnosis not present

## 2020-11-26 DIAGNOSIS — M25551 Pain in right hip: Secondary | ICD-10-CM | POA: Diagnosis not present

## 2020-11-26 DIAGNOSIS — M25552 Pain in left hip: Secondary | ICD-10-CM | POA: Diagnosis not present

## 2020-11-26 DIAGNOSIS — M5459 Other low back pain: Secondary | ICD-10-CM | POA: Diagnosis not present

## 2020-11-26 DIAGNOSIS — M25511 Pain in right shoulder: Secondary | ICD-10-CM | POA: Diagnosis not present

## 2020-11-27 DIAGNOSIS — M25551 Pain in right hip: Secondary | ICD-10-CM | POA: Diagnosis not present

## 2020-11-27 DIAGNOSIS — M25552 Pain in left hip: Secondary | ICD-10-CM | POA: Diagnosis not present

## 2020-11-27 DIAGNOSIS — M25511 Pain in right shoulder: Secondary | ICD-10-CM | POA: Diagnosis not present

## 2020-11-27 DIAGNOSIS — M5459 Other low back pain: Secondary | ICD-10-CM | POA: Diagnosis not present

## 2020-11-29 DIAGNOSIS — M25552 Pain in left hip: Secondary | ICD-10-CM | POA: Diagnosis not present

## 2020-11-29 DIAGNOSIS — M25551 Pain in right hip: Secondary | ICD-10-CM | POA: Diagnosis not present

## 2020-11-29 DIAGNOSIS — M5459 Other low back pain: Secondary | ICD-10-CM | POA: Diagnosis not present

## 2020-11-29 DIAGNOSIS — M25511 Pain in right shoulder: Secondary | ICD-10-CM | POA: Diagnosis not present

## 2020-12-03 DIAGNOSIS — M25552 Pain in left hip: Secondary | ICD-10-CM | POA: Diagnosis not present

## 2020-12-03 DIAGNOSIS — M25551 Pain in right hip: Secondary | ICD-10-CM | POA: Diagnosis not present

## 2020-12-03 DIAGNOSIS — M25511 Pain in right shoulder: Secondary | ICD-10-CM | POA: Diagnosis not present

## 2020-12-03 DIAGNOSIS — M5459 Other low back pain: Secondary | ICD-10-CM | POA: Diagnosis not present

## 2020-12-04 DIAGNOSIS — M25551 Pain in right hip: Secondary | ICD-10-CM | POA: Diagnosis not present

## 2020-12-04 DIAGNOSIS — M25552 Pain in left hip: Secondary | ICD-10-CM | POA: Diagnosis not present

## 2020-12-04 DIAGNOSIS — M25511 Pain in right shoulder: Secondary | ICD-10-CM | POA: Diagnosis not present

## 2020-12-04 DIAGNOSIS — M5459 Other low back pain: Secondary | ICD-10-CM | POA: Diagnosis not present

## 2020-12-19 DIAGNOSIS — E559 Vitamin D deficiency, unspecified: Secondary | ICD-10-CM | POA: Diagnosis not present

## 2020-12-19 DIAGNOSIS — K219 Gastro-esophageal reflux disease without esophagitis: Secondary | ICD-10-CM | POA: Diagnosis not present

## 2020-12-19 DIAGNOSIS — Z0001 Encounter for general adult medical examination with abnormal findings: Secondary | ICD-10-CM | POA: Diagnosis not present

## 2020-12-19 DIAGNOSIS — G4700 Insomnia, unspecified: Secondary | ICD-10-CM | POA: Diagnosis not present

## 2020-12-21 DIAGNOSIS — Z8616 Personal history of COVID-19: Secondary | ICD-10-CM | POA: Diagnosis not present

## 2020-12-21 DIAGNOSIS — Z0184 Encounter for antibody response examination: Secondary | ICD-10-CM | POA: Diagnosis not present

## 2021-01-02 DIAGNOSIS — L738 Other specified follicular disorders: Secondary | ICD-10-CM | POA: Diagnosis not present

## 2021-01-02 DIAGNOSIS — L719 Rosacea, unspecified: Secondary | ICD-10-CM | POA: Diagnosis not present

## 2021-01-02 DIAGNOSIS — L723 Sebaceous cyst: Secondary | ICD-10-CM | POA: Diagnosis not present

## 2021-02-06 ENCOUNTER — Telehealth: Payer: Self-pay | Admitting: Adult Health

## 2021-02-06 NOTE — Telephone Encounter (Signed)
Rescheduled appts per [facility] schedule change. Left voicemail with cancellation details and new appt date and time.

## 2021-02-07 ENCOUNTER — Inpatient Hospital Stay: Payer: Medicare Other | Admitting: Adult Health

## 2021-02-07 ENCOUNTER — Inpatient Hospital Stay: Payer: Medicare Other

## 2021-02-18 DIAGNOSIS — L570 Actinic keratosis: Secondary | ICD-10-CM | POA: Diagnosis not present

## 2021-02-19 DIAGNOSIS — Z1231 Encounter for screening mammogram for malignant neoplasm of breast: Secondary | ICD-10-CM | POA: Diagnosis not present

## 2021-02-22 ENCOUNTER — Other Ambulatory Visit: Payer: Self-pay

## 2021-02-22 ENCOUNTER — Encounter: Payer: Self-pay | Admitting: Adult Health

## 2021-02-22 ENCOUNTER — Inpatient Hospital Stay: Payer: Medicare Other | Admitting: Adult Health

## 2021-02-22 ENCOUNTER — Inpatient Hospital Stay: Payer: Medicare Other | Attending: Adult Health

## 2021-02-22 VITALS — BP 152/80 | HR 72 | Temp 98.0°F | Resp 18 | Ht 62.5 in | Wt 154.5 lb

## 2021-02-22 DIAGNOSIS — D509 Iron deficiency anemia, unspecified: Secondary | ICD-10-CM | POA: Insufficient documentation

## 2021-02-22 DIAGNOSIS — F419 Anxiety disorder, unspecified: Secondary | ICD-10-CM | POA: Insufficient documentation

## 2021-02-22 DIAGNOSIS — C50412 Malignant neoplasm of upper-outer quadrant of left female breast: Secondary | ICD-10-CM | POA: Diagnosis not present

## 2021-02-22 DIAGNOSIS — K219 Gastro-esophageal reflux disease without esophagitis: Secondary | ICD-10-CM | POA: Insufficient documentation

## 2021-02-22 DIAGNOSIS — Z171 Estrogen receptor negative status [ER-]: Secondary | ICD-10-CM | POA: Insufficient documentation

## 2021-02-22 DIAGNOSIS — E785 Hyperlipidemia, unspecified: Secondary | ICD-10-CM | POA: Diagnosis not present

## 2021-02-22 DIAGNOSIS — Z87891 Personal history of nicotine dependence: Secondary | ICD-10-CM | POA: Diagnosis not present

## 2021-02-22 DIAGNOSIS — Z923 Personal history of irradiation: Secondary | ICD-10-CM | POA: Insufficient documentation

## 2021-02-22 DIAGNOSIS — Z803 Family history of malignant neoplasm of breast: Secondary | ICD-10-CM | POA: Diagnosis not present

## 2021-02-22 DIAGNOSIS — Z7982 Long term (current) use of aspirin: Secondary | ICD-10-CM | POA: Insufficient documentation

## 2021-02-22 DIAGNOSIS — E559 Vitamin D deficiency, unspecified: Secondary | ICD-10-CM | POA: Insufficient documentation

## 2021-02-22 LAB — CMP (CANCER CENTER ONLY)
ALT: 22 U/L (ref 0–44)
AST: 16 U/L (ref 15–41)
Albumin: 4.4 g/dL (ref 3.5–5.0)
Alkaline Phosphatase: 63 U/L (ref 38–126)
Anion gap: 8 (ref 5–15)
BUN: 10 mg/dL (ref 8–23)
CO2: 27 mmol/L (ref 22–32)
Calcium: 9.9 mg/dL (ref 8.9–10.3)
Chloride: 97 mmol/L — ABNORMAL LOW (ref 98–111)
Creatinine: 0.77 mg/dL (ref 0.44–1.00)
GFR, Estimated: >60 mL/min (ref >60)
Glucose, Bld: 96 mg/dL (ref 70–99)
Potassium: 3.6 mmol/L (ref 3.5–5.1)
Sodium: 132 mmol/L — ABNORMAL LOW (ref 135–145)
Total Bilirubin: 0.7 mg/dL (ref 0.3–1.2)
Total Protein: 7.4 g/dL (ref 6.5–8.1)

## 2021-02-22 LAB — CBC WITH DIFFERENTIAL (CANCER CENTER ONLY)
Abs Immature Granulocytes: 0.01 10*3/uL (ref 0.00–0.07)
Basophils Absolute: 0.1 10*3/uL (ref 0.0–0.1)
Basophils Relative: 1 %
Eosinophils Absolute: 0.1 10*3/uL (ref 0.0–0.5)
Eosinophils Relative: 2 %
HCT: 42 % (ref 36.0–46.0)
Hemoglobin: 15 g/dL (ref 12.0–15.0)
Immature Granulocytes: 0 %
Lymphocytes Relative: 19 %
Lymphs Abs: 1.1 10*3/uL (ref 0.7–4.0)
MCH: 32.4 pg (ref 26.0–34.0)
MCHC: 35.7 g/dL (ref 30.0–36.0)
MCV: 90.7 fL (ref 80.0–100.0)
Monocytes Absolute: 0.5 10*3/uL (ref 0.1–1.0)
Monocytes Relative: 9 %
Neutro Abs: 4.1 10*3/uL (ref 1.7–7.7)
Neutrophils Relative %: 69 %
Platelet Count: 293 10*3/uL (ref 150–400)
RBC: 4.63 MIL/uL (ref 3.87–5.11)
RDW: 12.3 % (ref 11.5–15.5)
WBC Count: 6 10*3/uL (ref 4.0–10.5)
nRBC: 0 % (ref 0.0–0.2)

## 2021-02-22 LAB — VITAMIN D 25 HYDROXY (VIT D DEFICIENCY, FRACTURES): Vit D, 25-Hydroxy: 113.98 ng/mL — ABNORMAL HIGH (ref 30–100)

## 2021-02-22 NOTE — Patient Instructions (Signed)

## 2021-02-22 NOTE — Progress Notes (Signed)
CLINIC:  Survivorship   REASON FOR VISIT:  Routine follow-up for history of breast cancer.   BRIEF ONCOLOGIC HISTORY:  Per Dr. Virgie Dad last note:  70 y.o. [place] woman status post left breast upper outer quadrant biopsy 12/12/2013 for a clinical T1a N0 invasive ductal carcinoma, low to intermediate grade, triple negative, with an MIB-1 of 72%  (1) status post left lumpectomy and sentinel lymph node sampling 01/16/2014 for a pT1c pN0, stage IA invasive ductal carcinoma, grade 3, repeat prognostic panel again triple negative.  (2)  adjuvant chemotherapy consisting of 4 cycles of docetaxel/cyclophosphamide given on a Q. three-week basis, completed 05/15/2014  (3) adjuvant radiation completed 07/18/2014  INTERVAL HISTORY:  Ms. Kimbell presents to the [facility] today for routine follow-up for her history of breast cancer.  Overall, she reports feeling quite well. She was diagnosed when she was 70 years old.    Since her last visit, she underwent bilateral mammogram on 02/20/2021 at [facility] and it was normal per her report.  She has no new breast concerns and is feeling well without any questions or concerns today.    REVIEW OF SYSTEMS:  Review of Systems  Constitutional: Negative for appetite change, chills, fatigue, fever and unexpected weight change.  HENT:   Negative for hearing loss, lump/mass and trouble swallowing.   Eyes: Negative for eye problems and icterus.  Respiratory: Negative for chest tightness, cough and shortness of breath.   Cardiovascular: Negative for chest pain, leg swelling and palpitations.  Gastrointestinal: Negative for abdominal distention, abdominal pain, constipation, diarrhea, nausea and vomiting.  Endocrine: Negative for hot flashes.  Genitourinary: Negative for difficulty urinating.   Musculoskeletal: Negative for arthralgias.  Skin: Negative for itching and rash.  Neurological: Negative for dizziness, extremity weakness, headaches and  numbness.  Hematological: Negative for adenopathy. Does not bruise/bleed easily.  Psychiatric/Behavioral: Negative for depression. The patient is not nervous/anxious.   Breast: Denies any new nodularity, masses, tenderness, nipple changes, or nipple discharge.       PAST MEDICAL/SURGICAL HISTORY:  Past Medical History:  Diagnosis Date  . Anxiety    probable panic attacks  . Cancer ([place])    breast  . Dysphagia   . Esophageal dysmotility   . GERD (gastroesophageal reflux disease)   . H/O hiatal hernia   . Hyperlipidemia   . Insomnia   . Iron deficiency anemia    hx  . Other malaise and fatigue   . Radiation 06/05/14-07/18/14   left breast 50.4 gray, lumpectomy cavity boosted to 60.4 gray  . SVT (supraventricular tachycardia) (HCC)    documented-takes metoprolol as needed  . Vitamin D deficiency    Past Surgical History:  Procedure Laterality Date  . CARPAL TUNNEL RELEASE     lt and rt  . CHOLECYSTECTOMY  1999  . COLONOSCOPY WITH PROPOFOL N/A 03/20/2015   Procedure: COLONOSCOPY WITH PROPOFOL;  Surgeon: Garlan Fair, MD;  Location: [facility];  Service: Endoscopy;  Laterality: N/A;  . ELECTROPHYSIOLOGIC STUDY N/A 05/30/2015   Procedure: SVT Ablation;  Surgeon: Evans Lance, MD;  Location: [facility];  Service: Cardiovascular;  Laterality: N/A;  . PARTIAL MASTECTOMY WITH NEEDLE LOCALIZATION AND AXILLARY SENTINEL LYMPH NODE BX Left 01/16/2014   Procedure: PARTIAL MASTECTOMY WITH NEEDLE LOCALIZATION AND AXILLARY SENTINEL LYMPH NODE BIOPSY;  Surgeon: [name], MD;  Location: [place];  Service: General;  Laterality: Left;  . PORT-A-CATH REMOVAL Right 08/23/2014   Procedure: MINOR REMOVAL PORT-A-CATH;  Surgeon: Fanny Skates,  MD;  Location: [place];  Service: General;  Laterality: Right;  . PORTACATH PLACEMENT Right 02/28/2014   Procedure: INSERTION PORT-A-CATH;  Surgeon: [name], MD;  Location: [place];  Service: General;   Laterality: Right;     ALLERGIES:  Allergies  Allergen Reactions  . Erythromycin Base Other (See Comments)    Stomach ache     CURRENT MEDICATIONS:  Outpatient Encounter Medications as of 02/22/2021  Medication Sig  . aspirin EC 81 MG tablet Take 81 mg by mouth daily.  . Cholecalciferol (VITAMIN D) 2000 UNITS tablet Take 2,000 Units by mouth daily.  . clonazePAM (KLONOPIN) 1 MG tablet Take 1 mg by mouth at bedtime.  Marland Kitchen doxycycline (VIBRAMYCIN) 100 MG capsule Take by mouth daily.  Marland Kitchen loratadine (CLARITIN) 10 MG tablet Take 10 mg by mouth every evening.   . nitroGLYCERIN (NITROSTAT) 0.4 MG SL tablet Place 0.4 mg under the tongue every 5 (five) minutes as needed (Esophageal spasms).   . pantoprazole (PROTONIX) 40 MG tablet Take 40 mg by mouth daily.  . sertraline (ZOLOFT) 100 MG tablet Take 50 mg by mouth every morning.  . simvastatin (ZOCOR) 40 MG tablet Take 20 mg by mouth at bedtime.  . traMADol (ULTRAM) 50 MG tablet Take 1 tablet (50 mg total) by mouth at bedtime as needed.  . valACYclovir (VALTREX) 1000 MG tablet Take 1 g by mouth daily as needed (fever blisters).   . diclofenac (VOLTAREN) 75 MG EC tablet Take 1 tablet (75 mg total) by mouth 2 (two) times daily. (Patient not taking: Reported on 02/22/2021)  . methylPREDNISolone (MEDROL DOSEPAK) 4 MG TBPK tablet Use as directed (Patient not taking: Reported on 02/22/2021)  . metoprolol tartrate (LOPRESSOR) 50 MG tablet Take 1 tablet by mouth 2 hours prior to Cardiac CT (Patient not taking: Reported on 02/22/2021)   No facility-administered encounter medications on file as of 02/22/2021.     ONCOLOGIC FAMILY HISTORY:  Family History  Problem Relation Age of Onset  . Atrial fibrillation Mother 53  . Hyperlipidemia Mother   . Hypertension Mother   . Cancer Father 24       bladder  . Hypercholesterolemia Father   . Hypertension Father   . Cancer Maternal Aunt        breast  . Colon cancer Neg Hx   . CAD Neg Hx     GENETIC  COUNSELING/TESTING: Not at this time  SOCIAL HISTORY:  Social History   Socioeconomic History  . Marital status: Married    Spouse name: Not on file  . Number of children: 2  . Years of education: Not on file  . Highest education level: Not on file  Occupational History  . Occupation: Architectural technologist: [employer]  Tobacco Use  . Smoking status: Former Smoker    Packs/day: 1.50    Years: 8.00    Pack years: 12.00    Types: Cigarettes    Quit date: 01/22/1976    Years since quitting: 45.1  . Smokeless tobacco: Never Used  Vaping Use  . Vaping Use: Never used  Substance and Sexual Activity  . Alcohol use: Yes    Alcohol/week: 7.0 standard drinks    Types: 7 Shots of liquor per week    Comment: 1 vodka per day  . Drug use: No  . Sexual activity: Yes    Birth control/protection: Post-menopausal  Other Topics Concern  . Not on file  Social History Narrative  .  Not on file   Social Determinants of Health   Financial Resource Strain: Not on file  Food Insecurity: Not on file  Transportation Needs: Not on file  Physical Activity: Not on file  Stress: Not on file  Social Connections: Not on file  Intimate Partner Violence: Not on file      PHYSICAL EXAMINATION:  Vital Signs: Vitals:   02/22/21 1133  BP: (!) 152/80  Pulse: 72  Resp: 18  Temp: 98 F (36.7 C)  SpO2: 98%   Filed Weights   02/22/21 1133  Weight: 154 lb 8 oz (70.1 kg)   General: Well-nourished, well-appearing female in no acute distress.  Unaccompanied today.   HEENT: Head is normocephalic.  Pupils equal and reactive to light. Conjunctivae clear without exudate.  Sclerae anicteric. Oral mucosa is pink, moist.  Oropharynx is pink without lesions or erythema.  Lymph: No cervical, supraclavicular, or infraclavicular lymphadenopathy noted on palpation.  Cardiovascular: Regular rate and rhythm.Marland Kitchen Respiratory: Clear to auscultation bilaterally. Chest expansion symmetric; breathing  non-labored.  Breast Exam:  -Left breast: No appreciable masses on palpation. No skin redness, thickening, or peau d'orange appearance; no nipple retraction or nipple discharge; mild distortion in symmetry at previous lumpectomy site well healed scar without erythema or nodularity.  -Right breast: No appreciable masses on palpation. No skin redness, thickening, or peau d'orange appearance; no nipple retraction or nipple discharge;  -Axilla: No axillary adenopathy bilaterally.  GI: Abdomen soft and round; non-tender, non-distended. Bowel sounds normoactive. No hepatosplenomegaly.   GU: Deferred.  Neuro: No focal deficits. Steady gait.  Psych: Mood and affect normal and appropriate for situation.  MSK: No focal spinal tenderness to palpation, full range of motion in bilateral upper extremities Extremities: No edema. Skin: Warm and dry.  LABORATORY DATA:  None for this visit   DIAGNOSTIC IMAGING:  Most recent mammogram:    ASSESSMENT AND PLAN:  Ms.. Xu is a pleasant 70 y.o. female with history of Stage IA left breast invasive ductal carcinoma, ER-/PR-/HER2-, diagnosed in 2015, treated with lumpectomy, adjuvant chemotherapy, and adjuvant radiation therapy.  [facility] for [facility] and routine follow-up.   1. History of breast cancer:  Ms. Shivley is currently clinically and radiographically without evidence of disease or recurrence of breast cancer. She will be due for mammogram in 02/2021.  After discussion, she has opted to f/u from surveillance here, and will return as needed.  She knows   2.  Bone health:  Given Ms. Roesler's age, history of breast cancer, she is at risk for bone demineralization. Marland Kitchen  She was given education on specific food and activities to promote bone health.  3. Cancer screening:  Due to Ms. Yeatman's history and her age, she should receive screening for skin cancers, colon cancer, and gynecologic cancers. She was encouraged to  follow-up with her PCP for appropriate cancer screenings.   4. Health maintenance and wellness promotion: Ms. Burkhammer was encouraged to consume 5-7 servings of fruits and vegetables per day. She was also encouraged to engage in moderate to vigorous exercise for 30 minutes per day most days of the week. She was instructed to limit her alcohol consumption and continue to abstain from tobacco use.    Dispo:  -Return to cancer center PRN -mammogram in 02/2021    Total encounter time: 20 minutes*    Gardenia Phlegm, [place] [phone]   Note: PRIMARY CARE PROVIDER Josetta Huddle, [place] [phone]  *Total  Encounter Time as defined by the Centers for Medicare and Medicaid Services includes, in addition to the face-to-face time of a patient visit (documented in the note above) non-face-to-face time: obtaining and reviewing outside history, ordering and reviewing medications, tests or procedures, care coordination (communications with other health care professionals or caregivers) and documentation in the medical record.

## 2021-02-25 ENCOUNTER — Encounter: Payer: Self-pay | Admitting: Oncology

## 2021-04-09 ENCOUNTER — Other Ambulatory Visit: Payer: Self-pay

## 2021-04-09 ENCOUNTER — Encounter: Payer: Self-pay | Admitting: Orthopaedic Surgery

## 2021-04-09 ENCOUNTER — Ambulatory Visit: Payer: Medicare Other | Admitting: Orthopaedic Surgery

## 2021-04-09 DIAGNOSIS — M25511 Pain in right shoulder: Secondary | ICD-10-CM | POA: Diagnosis not present

## 2021-04-09 DIAGNOSIS — M542 Cervicalgia: Secondary | ICD-10-CM | POA: Diagnosis not present

## 2021-04-09 DIAGNOSIS — M25551 Pain in right hip: Secondary | ICD-10-CM | POA: Diagnosis not present

## 2021-04-09 DIAGNOSIS — M25552 Pain in left hip: Secondary | ICD-10-CM

## 2021-04-09 DIAGNOSIS — G8929 Other chronic pain: Secondary | ICD-10-CM

## 2021-04-09 MED ORDER — PREDNISONE 10 MG (21) PO TBPK: ORAL_TABLET | ORAL | 0 refills | Status: DC

## 2021-04-09 MED ORDER — MELOXICAM 7.5 MG PO TABS: 7.5000 mg | ORAL_TABLET | Freq: Two times a day (BID) | ORAL | 2 refills | Status: DC | PRN

## 2021-04-09 NOTE — Progress Notes (Signed)
Office Visit Note   Patient: Sheri Arellano           Date of Birth: 27-Nov-1951           MRN: [id number] Visit Date: 04/09/2021              Requested by: Josetta Huddle, MD [address] [place],  [place] [address] PCP: Josetta Huddle, MD   Assessment & Plan: Visit Diagnoses:  1. Chronic right shoulder pain   2. Bilateral hip pain     Plan: At this point Sheri Arellano has participated in extensive physical therapy and has undergone cortisone injections without significant relief.  We will need an MRI of the pelvis to look for structural abnormalities as well as C-spine MRI to look for structural abnormalities.  She requested a prescription for meloxicam and prednisone to try.  We will see her back after the MRI.  Follow-Up Instructions: Return for follow up after MRI.   Orders:  No orders of the defined types were placed in this encounter.  Meds ordered this encounter  Medications  . meloxicam (MOBIC) 7.5 MG tablet    Sig: Take 1 tablet (7.5 mg total) by mouth 2 (two) times daily as needed for pain.    Dispense:  30 tablet    Refill:  2  . predniSONE (STERAPRED UNI-PAK 21 TAB) 10 MG (21) TBPK tablet    Sig: Take as directed    Dispense:  21 tablet    Refill:  0      Procedures: No procedures performed   Clinical Data: No additional findings.   Subjective: Chief Complaint  Patient presents with  . Right Shoulder - Pain  . Lower Back - Pain  . Neck - Pain  . Right Leg - Pain    Sheri Arellano comes back today for continued bilateral hip pain and right cervical radiculopathy.  She continues to have difficulty sleeping.  She had to ultrasound-guided cortisone injections into the trochanteric bursa's which only helped temporarily.  She has done extensive physical therapy and has spent over $500 and has not noticed any significant relief.  She continues to use a heating pad for the neck and shoulder pain.  Tramadol gave her severe headaches.  She takes Advil at  nighttime.   Review of Systems  Constitutional: Negative.   HENT: Negative.   Eyes: Negative.   Respiratory: Negative.   Cardiovascular: Negative.   Endocrine: Negative.   Musculoskeletal: Negative.   Neurological: Negative.   Hematological: Negative.   Psychiatric/Behavioral: Negative.   All other systems reviewed and are negative.    Objective: Vital Signs: There were no vitals taken for this visit.  Physical Exam Vitals and nursing note reviewed.  Constitutional:      Appearance: She is well-developed.  Pulmonary:     Effort: Pulmonary effort is normal.  Skin:    General: Skin is warm.     Capillary Refill: Capillary refill takes less than 2 seconds.  Neurological:     Mental Status: She is alert and oriented to person, place, and time.  Psychiatric:        Behavior: Behavior normal.        Thought Content: Thought content normal.        Judgment: Judgment normal.     Ortho Exam Bilateral hips showed point tenderness to the lateral aspect.  No groin pain with range of motion.  No sciatic tension signs.  Lumbar spine is nontender. Shoulder exam shows normal  strength and range of motion without pain.  Subjective radiation of pain down the right arm.  No focal motor or sensory deficits of the right upper extremity. Specialty Comments:  No specialty comments available.  Imaging: No results found.   PMFS History: Patient Active Problem List   Diagnosis Date Noted  . Acid reflux 07/25/2020  . Anemia, iron deficiency 07/25/2020  . Avitaminosis D 07/25/2020  . Cannot sleep 07/25/2020  . Combined fat and carbohydrate induced hyperlipemia 07/25/2020  . Cough 07/25/2020  . Current drug use 07/25/2020  . Discharge from the vagina 07/25/2020  . Fast heart beat 07/25/2020  . Fatigue 07/25/2020  . Pain in female genitalia on intercourse 07/25/2020  . Bilateral hip pain 05/29/2020  . SVT (supraventricular tachycardia) ([place]) 05/30/2015  . Atrophic vaginitis  07/26/2014  . Hemorrhoids without complication 13/24/4010  . Malignant neoplasm of upper-outer quadrant of left breast in female, estrogen receptor negative ([place]) 12/15/2013  . Environmental allergies 05/21/2012  . Rectal mucosa prolapse, anal skin tags 02/16/2012  . HYPERCHOLESTEROLEMIA 03/28/2009  . Anxiety state 03/28/2009  . SUPRAVENTRICULAR TACHYCARDIA 03/28/2009   Past Medical History:  Diagnosis Date  . Anxiety    probable panic attacks  . Cancer ([place])    breast  . Dysphagia   . Esophageal dysmotility   . GERD (gastroesophageal reflux disease)   . H/O hiatal hernia   . Hyperlipidemia   . Insomnia   . Iron deficiency anemia    hx  . Other malaise and fatigue   . Radiation 06/05/14-07/18/14   left breast 50.4 gray, lumpectomy cavity boosted to 60.4 gray  . SVT (supraventricular tachycardia) ([facility])    documented-takes metoprolol as needed  . Vitamin D deficiency     Family History  Problem Relation Age of Onset  . Atrial fibrillation Mother 55  . Hyperlipidemia Mother   . Hypertension Mother   . Cancer Father 28       bladder  . Hypercholesterolemia Father   . Hypertension Father   . Cancer Maternal Aunt        breast  . Colon cancer Neg Hx   . CAD Neg Hx     Past Surgical History:  Procedure Laterality Date  . CARPAL TUNNEL RELEASE     lt and rt  . CHOLECYSTECTOMY  1999  . COLONOSCOPY WITH PROPOFOL N/A 03/20/2015   Procedure: COLONOSCOPY WITH PROPOFOL;  Surgeon: Garlan Fair, MD;  [facility];  Service: Endoscopy;  Laterality: N/A;  . ELECTROPHYSIOLOGIC STUDY N/A 05/30/2015   Procedure: SVT Ablation;  Surgeon: Evans Lance, MD;  Location: [facility];  Service: Cardiovascular;  Laterality: N/A;  . PARTIAL MASTECTOMY WITH NEEDLE LOCALIZATION AND AXILLARY SENTINEL LYMPH NODE BX Left 01/16/2014   Procedure: PARTIAL MASTECTOMY WITH NEEDLE LOCALIZATION AND AXILLARY SENTINEL LYMPH NODE BIOPSY;  Surgeon: [name], MD;  Location: [facility];  Service: General;  Laterality: Left;  . PORT-A-CATH REMOVAL Right 08/23/2014   Procedure: MINOR REMOVAL PORT-A-CATH;  Surgeon: Fanny Skates, MD;  Location: [facility];  Service: General;  Laterality: Right;  . PORTACATH PLACEMENT Right 02/28/2014   Procedure: INSERTION PORT-A-CATH;  Surgeon: [name], MD;  Location: [facility];  Service: General;  Laterality: Right;   Social History   Occupational History  . Occupation: Architectural technologist: [employer]  Tobacco Use  . Smoking status: Former Smoker    Packs/day: 1.50    Years: 8.00  Pack years: 12.00    Types: Cigarettes    Quit date: 01/22/1976    Years since quitting: 45.2  . Smokeless tobacco: Never Used  Vaping Use  . Vaping Use: Never used  Substance and Sexual Activity  . Alcohol use: Yes    Alcohol/week: 7.0 standard drinks    Types: 7 Shots of liquor per week    Comment: 1 vodka per day  . Drug use: No  . Sexual activity: Yes    Birth control/protection: Post-menopausal

## 2021-04-09 NOTE — Addendum Note (Signed)
Addended by: Precious Bard on: 04/09/2021 03:09 PM   Modules accepted: Orders

## 2021-04-16 ENCOUNTER — Ambulatory Visit
Admission: RE | Admit: 2021-04-16 | Discharge: 2021-04-16 | Disposition: A | Discharge status: Home / Self Care | Payer: Medicare Other | Source: Ambulatory Visit | Attending: Orthopaedic Surgery | Admitting: Orthopaedic Surgery

## 2021-04-16 DIAGNOSIS — Z853 Personal history of malignant neoplasm of breast: Secondary | ICD-10-CM | POA: Diagnosis not present

## 2021-04-16 DIAGNOSIS — M25551 Pain in right hip: Secondary | ICD-10-CM

## 2021-04-16 DIAGNOSIS — M542 Cervicalgia: Secondary | ICD-10-CM

## 2021-04-16 DIAGNOSIS — D251 Intramural leiomyoma of uterus: Secondary | ICD-10-CM | POA: Diagnosis not present

## 2021-04-16 DIAGNOSIS — M25552 Pain in left hip: Secondary | ICD-10-CM | POA: Diagnosis not present

## 2021-04-16 DIAGNOSIS — M4802 Spinal stenosis, cervical region: Secondary | ICD-10-CM | POA: Diagnosis not present

## 2021-04-17 NOTE — Progress Notes (Signed)
Please order L spine MRI now that pelvis MRI is normal.  She will eventually need ESI for C spine and maybe L spine.  Thanks.

## 2021-04-18 ENCOUNTER — Other Ambulatory Visit: Payer: Self-pay

## 2021-04-18 DIAGNOSIS — M545 Low back pain, unspecified: Secondary | ICD-10-CM

## 2021-04-18 DIAGNOSIS — L570 Actinic keratosis: Secondary | ICD-10-CM | POA: Diagnosis not present

## 2021-04-30 ENCOUNTER — Ambulatory Visit
Admission: RE | Admit: 2021-04-30 | Discharge: 2021-04-30 | Disposition: A | Discharge status: Home / Self Care | Payer: Medicare Other | Source: Ambulatory Visit | Attending: Orthopaedic Surgery | Admitting: Orthopaedic Surgery

## 2021-04-30 ENCOUNTER — Other Ambulatory Visit: Payer: Self-pay

## 2021-04-30 DIAGNOSIS — M48061 Spinal stenosis, lumbar region without neurogenic claudication: Secondary | ICD-10-CM | POA: Diagnosis not present

## 2021-04-30 DIAGNOSIS — M545 Low back pain, unspecified: Secondary | ICD-10-CM

## 2021-05-01 NOTE — Progress Notes (Signed)
Needs appt

## 2021-05-02 DIAGNOSIS — H16223 Keratoconjunctivitis sicca, not specified as Sjogren's, bilateral: Secondary | ICD-10-CM | POA: Diagnosis not present

## 2021-05-02 DIAGNOSIS — H2513 Age-related nuclear cataract, bilateral: Secondary | ICD-10-CM | POA: Diagnosis not present

## 2021-05-02 DIAGNOSIS — H1045 Other chronic allergic conjunctivitis: Secondary | ICD-10-CM | POA: Diagnosis not present

## 2021-05-09 ENCOUNTER — Other Ambulatory Visit: Payer: Self-pay

## 2021-05-09 ENCOUNTER — Ambulatory Visit: Payer: Medicare Other | Admitting: Orthopaedic Surgery

## 2021-05-09 DIAGNOSIS — M542 Cervicalgia: Secondary | ICD-10-CM

## 2021-05-09 DIAGNOSIS — M545 Low back pain, unspecified: Secondary | ICD-10-CM

## 2021-05-09 MED ORDER — METHOCARBAMOL 500 MG PO TABS: 500.0000 mg | ORAL_TABLET | Freq: Two times a day (BID) | ORAL | 0 refills | Status: DC | PRN

## 2021-05-09 MED ORDER — ACETAMINOPHEN-CODEINE #3 300-30 MG PO TABS: 1.0000 | ORAL_TABLET | Freq: Three times a day (TID) | ORAL | 2 refills | Status: AC | PRN

## 2021-05-09 NOTE — Progress Notes (Signed)
Office Visit Note   Patient: Sheri Arellano           Date of Birth: [birth year]           MRN: [id number] Visit Date: 05/09/2021              Requested by: Josetta Huddle, MD [address] [place],  [place] [id number] PCP: Josetta Huddle, MD   Assessment & Plan: Visit Diagnoses:  1. Cervicalgia   2. Low back pain, unspecified back pain laterality, unspecified chronicity, unspecified whether sciatica present     Plan: Impression is lumbar facet joint arthrosis L4-5, bilateral neural foraminal narrowing L3-4 and moderate narrowing bilateral lower subarticular zones and neuroforaminal narrowing L4-5.  Cervical spine spinal stenosis C5-6 and C6-7 as well as mild bilateral C4 and right C5 foraminal stenosis, severe left with moderate right C6 foraminal narrowing, with moderate right and mild to moderate left C7 foraminal stenosis.  This was all discussed in detail with the patient.  We have discussed referral to Dr. Ernestina Patches for epidural steroid injection to the cervical and lumbar spines.  She is in agreement would like to proceed.  She will follow-up with [place] as needed.   Follow-Up Instructions: Return for with Dr. Ernestina Patches for [facility] c-spine/l-spine.   Orders:  No orders of the defined types were placed in this encounter.  Meds ordered this encounter  Medications  . acetaminophen-codeine (TYLENOL #3) 300-30 MG tablet    Sig: Take 1 tablet by mouth every 8 (eight) hours as needed for moderate pain.    Dispense:  60 tablet    Refill:  2  . methocarbamol (ROBAXIN) 500 MG tablet    Sig: Take 1 tablet (500 mg total) by mouth 2 (two) times daily as needed.    Dispense:  30 tablet    Refill:  0      Procedures: No procedures performed   Clinical Data: No additional findings.   Subjective: Chief Complaint  Patient presents with  . Lower Back - Pain  . Neck - Pain    HPI patient is a pleasant 70 year old female who comes in today to discuss MRI results of the pelvis,  lumbar spine as well as the cervical spine.  She has been complaining of chronic neck pain and right upper extremity radiculopathy as well as chronic bilateral hip pain for a while.  She has tried a course of steroids and physical therapy without relief.  She has not previously undergone ESI.  MRI of the pelvis from 04/17/2021 was unremarkable.  MRI of the lumbar spine from 05/01/2021 shows facet joint arthrosis L4-5, bilateral neural foraminal narrowing L3-4 and moderate narrowing bilateral lower subarticular zones and neuroforaminal narrowing L4-5.  Cervical spine MRI from 04/17/2021 shows mild spinal stenosis C5-6 and C6-7 as well as mild bilateral C4 and right C5 foraminal stenosis, severe left with moderate right C6 foraminal narrowing, with moderate right and mild to moderate left C7 foraminal stenosis.  Review of Systems as detailed in HPI.  All others reviewed and are negative.   Objective: Vital Signs: There were no vitals taken for this visit.  Physical Exam well-developed well-nourished female in no acute distress.  Alert and oriented x3.  Ortho Exam unchanged cervical spine and lumbar spine exams  Specialty Comments:  No specialty comments available.  Imaging: No new imaging   PMFS History: Patient Active Problem List   Diagnosis Date Noted  . Acid reflux 07/25/2020  . Anemia, iron deficiency 07/25/2020  .  Avitaminosis D 07/25/2020  . Cannot sleep 07/25/2020  . Combined fat and carbohydrate induced hyperlipemia 07/25/2020  . Cough 07/25/2020  . Current drug use 07/25/2020  . Discharge from the vagina 07/25/2020  . Fast heart beat 07/25/2020  . Fatigue 07/25/2020  . Pain in female genitalia on intercourse 07/25/2020  . Bilateral hip pain 05/29/2020  . SVT (supraventricular tachycardia) ([place]) 05/30/2015  . Atrophic vaginitis 07/26/2014  . Hemorrhoids without complication 96/75/9163  . Malignant neoplasm of upper-outer quadrant of left breast in female, estrogen receptor  negative ([place]) 12/15/2013  . Environmental allergies 05/21/2012  . Rectal mucosa prolapse, anal skin tags 02/16/2012  . HYPERCHOLESTEROLEMIA 03/28/2009  . Anxiety state 03/28/2009  . SUPRAVENTRICULAR TACHYCARDIA 03/28/2009   Past Medical History:  Diagnosis Date  . Anxiety    probable panic attacks  . Cancer ([place])    breast  . Dysphagia   . Esophageal dysmotility   . GERD (gastroesophageal reflux disease)   . H/O hiatal hernia   . Hyperlipidemia   . Insomnia   . Iron deficiency anemia    hx  . Other malaise and fatigue   . Radiation 06/05/14-07/18/14   left breast 50.4 gray, lumpectomy cavity boosted to 60.4 gray  . SVT (supraventricular tachycardia) ([facility])    documented-takes metoprolol as needed  . Vitamin D deficiency     Family History  Problem Relation Age of Onset  . Atrial fibrillation Mother 94  . Hyperlipidemia Mother   . Hypertension Mother   . Cancer Father 77       bladder  . Hypercholesterolemia Father   . Hypertension Father   . Cancer Maternal Aunt        breast  . Colon cancer Neg Hx   . CAD Neg Hx     Past Surgical History:  Procedure Laterality Date  . CARPAL TUNNEL RELEASE     lt and rt  . CHOLECYSTECTOMY  1999  . COLONOSCOPY WITH PROPOFOL N/A 03/20/2015   Procedure: COLONOSCOPY WITH PROPOFOL;  Surgeon: Garlan Fair, MD;  Location: [facility];  Service: Endoscopy;  Laterality: N/A;  . ELECTROPHYSIOLOGIC STUDY N/A 05/30/2015   Procedure: SVT Ablation;  Surgeon: Evans Lance, MD;  Location: [facility];  Service: Cardiovascular;  Laterality: N/A;  . PARTIAL MASTECTOMY WITH NEEDLE LOCALIZATION AND AXILLARY SENTINEL LYMPH NODE BX Left 01/16/2014   Procedure: PARTIAL MASTECTOMY WITH NEEDLE LOCALIZATION AND AXILLARY SENTINEL LYMPH NODE BIOPSY;  Surgeon: [name], MD;  Location: [facility];  Service: General;  Laterality: Left;  . PORT-A-CATH REMOVAL Right 08/23/2014   Procedure: MINOR REMOVAL PORT-A-CATH;  Surgeon:  Fanny Skates, MD;  Location: [facility];  Service: General;  Laterality: Right;  . PORTACATH PLACEMENT Right 02/28/2014   Procedure: INSERTION PORT-A-CATH;  Surgeon: [name], MD;  Location: [facility];  Service: General;  Laterality: Right;   Social History   Occupational History  . Occupation: Architectural technologist: [employer]  Tobacco Use  . Smoking status: Former Smoker    Packs/day: 1.50    Years: 8.00    Pack years: 12.00    Types: Cigarettes    Quit date: 01/22/1976    Years since quitting: 45.3  . Smokeless tobacco: Never Used  Vaping Use  . Vaping Use: Never used  Substance and Sexual Activity  . Alcohol use: Yes    Alcohol/week: 7.0 standard drinks    Types: 7 Shots of liquor per week  Comment: 1 vodka per day  . Drug use: No  . Sexual activity: Yes    Birth control/protection: Post-menopausal

## 2021-05-21 ENCOUNTER — Ambulatory Visit: Payer: Medicare Other | Admitting: Physical Medicine and Rehabilitation

## 2021-05-21 ENCOUNTER — Ambulatory Visit: Payer: Self-pay

## 2021-05-21 ENCOUNTER — Encounter: Payer: Self-pay | Admitting: Physical Medicine and Rehabilitation

## 2021-05-21 ENCOUNTER — Other Ambulatory Visit: Payer: Self-pay

## 2021-05-21 VITALS — BP 152/85 | HR 71

## 2021-05-21 DIAGNOSIS — M4802 Spinal stenosis, cervical region: Secondary | ICD-10-CM

## 2021-05-21 DIAGNOSIS — M25552 Pain in left hip: Secondary | ICD-10-CM

## 2021-05-21 DIAGNOSIS — M5442 Lumbago with sciatica, left side: Secondary | ICD-10-CM

## 2021-05-21 DIAGNOSIS — M542 Cervicalgia: Secondary | ICD-10-CM

## 2021-05-21 DIAGNOSIS — M47812 Spondylosis without myelopathy or radiculopathy, cervical region: Secondary | ICD-10-CM

## 2021-05-21 DIAGNOSIS — M25551 Pain in right hip: Secondary | ICD-10-CM

## 2021-05-21 DIAGNOSIS — M47816 Spondylosis without myelopathy or radiculopathy, lumbar region: Secondary | ICD-10-CM | POA: Diagnosis not present

## 2021-05-21 DIAGNOSIS — G8929 Other chronic pain: Secondary | ICD-10-CM

## 2021-05-21 DIAGNOSIS — M5412 Radiculopathy, cervical region: Secondary | ICD-10-CM | POA: Diagnosis not present

## 2021-05-21 DIAGNOSIS — M7918 Myalgia, other site: Secondary | ICD-10-CM

## 2021-05-21 DIAGNOSIS — M5441 Lumbago with sciatica, right side: Secondary | ICD-10-CM

## 2021-05-21 DIAGNOSIS — G894 Chronic pain syndrome: Secondary | ICD-10-CM

## 2021-05-21 MED ORDER — BETAMETHASONE SOD PHOS & ACET 6 (3-3) MG/ML IJ SUSP
12.0000 mg | Freq: Once | INTRAMUSCULAR | Status: AC
  Administered 2021-05-21: 12 mg

## 2021-05-21 NOTE — Progress Notes (Signed)
Pt state lower back pain that travels down both legs. Pt state walking, standing and laying down makes the pain worse. Pt state she takes pain meds to help ease her pain.  Numeric Pain Rating Scale and Functional Assessment Average Pain 2   In the last MONTH (on 0-10 scale) has pain interfered with the following?  1. General activity like being  able to carry out your everyday physical activities such as walking, climbing stairs, carrying groceries, or moving a chair?  Rating(10)   +Driver, -BT, -Dye Allergies.

## 2021-05-21 NOTE — Patient Instructions (Signed)

## 2021-05-27 ENCOUNTER — Telehealth: Payer: Self-pay | Admitting: Orthopaedic Surgery

## 2021-05-27 NOTE — Telephone Encounter (Signed)
MRI Pelvis faxed to pts PCP [facility] [phone] per patient request

## 2021-05-29 ENCOUNTER — Telehealth: Payer: Self-pay | Admitting: Physical Medicine and Rehabilitation

## 2021-05-29 NOTE — Telephone Encounter (Signed)
Pt called stating she will be out of town 06/19/21 and needs to R/S her appt.  [phone]

## 2021-05-31 NOTE — Telephone Encounter (Signed)
Rescheduled

## 2021-06-02 ENCOUNTER — Encounter: Payer: Self-pay | Admitting: Physical Medicine and Rehabilitation

## 2021-06-02 NOTE — Progress Notes (Signed)
Sheri Arellano - 70 y.o. female MRN [id number]  Date of birth: 18-Nov-1951  Office Visit Note: Visit Date: 05/21/2021 PCP: Josetta Huddle, MD Referred by: Josetta Huddle, MD  Subjective: Chief Complaint  Patient presents with   Lower Back - Pain   Left Leg - Pain   Right Leg - Pain   Neck - Pain   Right Shoulder - Pain   Left Shoulder - Pain   HPI: Sheri Arellano is a 70 y.o. female who comes in today At the request of Dr. Eduard Roux for evaluation and management for chronic worsening severe neck pain with bilateral right more than left shoulder pain as well as bilateral hip pain with failure of conservative care including medication management and extensive physical therapy.  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  She reports to me that she has an an exceptional high tolerance for pain.  She is very frustrated by the fact that she has spent a lot of money with physical therapy and office visits and has had no relief of her pain at this point.  She reports that she feels like she has not been shown or talked about any of the problems along with her neck or back or shoulders or hips.  I did review the notes extensively from Dr. Eduard Roux as well as his physician assistant Tawanna Cooler, PA-C and the notes are fairly specific in the evaluation and management of her problems to date.  Patient has had meloxicam and oral prednisone as well as muscle relaxer.  The patient's course is complicated by history of depression and anxiety as well as history of breast cancer.  She reports no prior lumbar or cervical spine surgery.  She has had injections in the hips and bursa and shoulders without any relief.  She rates her pain as averaging a 2 out of 10 but she does again report pain tolerance.  She reports that it is a 10 out of 10 and affecting her  daily activities however.  Again she notes pain in the lower back and hips and down both legs.  I did review the MRI of the lumbar spine with her using spine models and the imaging itself.  She was very appreciative to see the images.  This mainly showed facet joint arthritic changes at L4-5 with some lateral recess narrowing but no high-grade stenosis no nerve compression.  We educated her at length on the fact that disc bulging does not hurt nor does normal degenerative changes of the spine.  She has had no focal weakness or bowel or bladder changes etc.  Her biggest complaint today from a pain standpoint is her neck and right more than left shoulder.  She has pain with looking around extension some pain referral into the shoulders posterior laterally.  Occasional paresthesia type pain.  History of remote bilateral carpal tunnel releases.  I also reviewed the MRI of the cervical spine with her.  This was obtained when Dr. Erlinda Hong felt like her symptoms are more radicular than related to her shoulders.  Again she had mild overall central stenosis and no high-grade compression but there is by foraminal narrowing at C5-6 and right more than left foraminal narrowing at C6-7.  Hard to know if that a source of pain but it could be.  She does have arthritic changes as well.  Again tried and failed all manner of conservative physical therapy with this.  No definitive diagnosis of fibromyalgia.  Review of Systems  Constitutional:  Positive for malaise/fatigue.  Musculoskeletal:  Positive for back pain, joint pain and neck pain.  Neurological:  Positive for tingling.  All other systems reviewed and are negative. Otherwise per HPI.  Assessment & Plan: Visit Diagnoses:    ICD-10-CM   1. Cervical radiculopathy  M54.12 XR C-ARM NO REPORT    Epidural Steroid injection    betamethasone acetate-betamethasone sodium phosphate (CELESTONE) injection 12 mg    2. Cervicalgia  M54.2     3. Cervical spondylosis without  myelopathy  M47.812     4. Foraminal stenosis of cervical region  M48.02     5. Spondylosis without myelopathy or radiculopathy, lumbar region  M47.816     6. Chronic bilateral low back pain with bilateral sciatica  M54.42    M54.41    G89.29     7. Pain in left hip  M25.552     8. Pain in right hip  M25.551     9. Myofascial pain syndrome  M79.18     10. Chronic pain syndrome  G89.4        Plan: Findings:  1.  Cervicalgia and cervical spondylosis and foraminal cervical stenosis without high-grade central canal stenosis or nerve compression.  Chronic severe pain not really amenable to conservative care and medication management.  I do recommend one-time cervical epidural injection diagnostically this would be done with fluoroscopic guidance.  If she gets good relief and these could be repeated from time to time and that something we do see.  If she is not getting much relief with that at all I would suggest surgery referral although there is nothing specific other than foraminal narrowing and she does have this in multiple areas.  Continue with home exercise program and medication management.  Sharol Given the amount of pain that she is having we did elect to do the cervical epidural injection today.  2.  Chronic worsening severe low back pain with bilateral hip and leg pain.  Normal pelvic MRI and fairly normal lumbar spine for age with facet arthropathy and lateral recess narrowing.  Would recommend diagnostic and therapeutic L4 transforaminal injection potentially.  For her back pain we could look at lumbar facet joint blocks with goal towards radiofrequency ablation.  She has failed all manner of other conservative care to this point.   Meds & Orders:  Meds ordered this encounter  Medications   betamethasone acetate-betamethasone sodium phosphate (CELESTONE) injection 12 mg    Orders Placed This Encounter  Procedures   XR C-ARM NO REPORT   Epidural Steroid injection    Follow-up: Return  if symptoms worsen or fail to improve.   Procedures: No procedures performed  Cervical Epidural Steroid Injection - Interlaminar Approach with Fluoroscopic Guidance  Patient: Sheri Arellano      Date of Birth: [birth year] MRN: [id number] PCP: Josetta Huddle, MD      Visit Date: 05/21/2021   Universal Protocol:    Date/Time: 06/02/2209:26 AM  Consent Given By: the patient  Position: PRONE  Additional Comments: Vital signs were monitored before and after the procedure. Patient was prepped and draped in the usual sterile fashion. The correct patient, procedure, and site was verified.   Injection Procedure Details:   Procedure diagnoses: Lumbar radiculopathy [M54.16]    Meds Administered:  Meds ordered this encounter  Medications   betamethasone acetate-betamethasone sodium phosphate (CELESTONE) injection 12 mg     Laterality: Right  Location/Site: C7-T1  Needle: 3.5 in., 20 ga. Tuohy  Needle Placement: Paramedian epidural space  Findings:  -Comments: Excellent flow of contrast into the epidural space.  Procedure Details: Using a paramedian approach from the side mentioned above, the region overlying the inferior lamina was localized under fluoroscopic visualization and the soft tissues overlying this structure were infiltrated with 4 ml. of 1% Lidocaine without Epinephrine. A # 20 gauge, Tuohy needle was inserted into the epidural space using a paramedian approach.  The epidural space was localized using loss of resistance along with contralateral oblique bi-planar fluoroscopic views.  After negative aspirate for air, blood, and CSF, a 2 ml. volume of Isovue-250 was injected into the epidural space and the flow of contrast was observed. Radiographs were obtained for documentation purposes.   The injectate was administered into the level noted above.  Additional Comments:  The patient tolerated the procedure well Dressing: 2 x 2 sterile gauze and Band-Aid     Post-procedure details: Patient was observed during the procedure. Post-procedure instructions were reviewed.  Patient left the clinic in stable condition.   Clinical History: MRI LUMBAR SPINE WITHOUT CONTRAST     FINDINGS:    Alignment: Mild levoscoliosis. Small anterolisthesis of L2 over L3 and L4 over L5.    Disc levels:   T12-L1: No spinal canal or neural foraminal stenosis.   L1-2: Mild facet degenerative changes. No spinal canal or neural foraminal stenosis.   L2-3: Shallow disc bulge and moderate facet degenerative changes. No significant spinal canal or neural foraminal narrowing.   L3-4: Disc bulge, moderate facet degenerative changes and ligamentum flavum redundancy resulting mild spinal canal stenosis with mild narrowing of the bilateral subarticular zones and mild bilateral neural foraminal narrowing.   L4-5: Mild loss of disc height, disc bulge, prominent hypertrophic facet degenerative changes and ligamentum flavum redundancy resulting in narrowing of the bilateral subarticular zones mild bilateral neural foraminal.   L5-S1: Mild right and moderate left facet degenerative changes. No significant spinal canal or neural foraminal stenosis.   IMPRESSION: 1. Degenerative changes of the lumbar spine, more pronounced at the facet joints, particularly at L4-5. 2. Mild spinal canal stenosis with mild narrowing of the bilateral subarticular zones mild bilateral neural foraminal narrowing at L3-4. 3. Moderate narrowing of the bilateral subarticular zones, right greater than left, and mild bilateral neural foraminal at L4-5.     Electronically Signed By: Pedro Earls M.D. On: 05/01/2021 09:09 ----- MRI CERVICAL SPINE WITHOUT CONTRAST   FINDINGS: Alignment: Mild straightening of the normal cervical lordosis. Trace anterolisthesis of C7 on T1 and T1 on T2.   Vertebrae: Vertebral body height maintained without acute or chronic fracture.  Bone marrow signal intensity within normal limits. No discrete or worrisome osseous lesions. No abnormal marrow edema.   Cord: Normal signal and morphology.   Posterior Fossa, vertebral arteries, paraspinal tissues: No made of an empty sella. Visualized brain and posterior fossa otherwise unremarkable. Craniocervical junction within normal limits. Paraspinous and prevertebral soft tissues within normal limits. Normal flow voids seen within the vertebral arteries bilaterally.   Disc levels:   C2-C3: Minimal disc bulge. Mild left greater than right facet hypertrophy. No stenosis.   C3-C4: Negative interspace. Mild right greater than left facet hypertrophy. No spinal stenosis. Mild bilateral C4 foraminal narrowing.   C4-C5: Disc bulge with bilateral uncovertebral hypertrophy. Right-sided facet hypertrophy with ligament flavum thickening.  No significant spinal stenosis. Mild right C5 foraminal stenosis. Left neural foramina remains patent.   C5-C6: Degenerative intervertebral disc space narrowing with diffuse disc osteophyte complex. Flattening and partial effacement of the ventral thecal sac with resultant mild spinal stenosis. Severe left with moderate right C6 foraminal narrowing.   C6-C7: Degenerative intervertebral disc space narrowing with diffuse disc osteophyte complex. Flattening and partial effacement of the ventral thecal sac. Superimposed facet and ligament flavum hypertrophy. Mild spinal stenosis without cord impingement. Moderate right with mild to moderate left C7 foraminal stenosis.   C7-T1: Negative interspace. Left greater than right facet hypertrophy. No spinal stenosis. Mild left C8 foraminal narrowing. Right neural foramina remains patent.   Visualized upper thoracic spine demonstrates no significant finding.   IMPRESSION: 1. Multilevel cervical spondylosis with resultant mild spinal stenosis at C5-6 and C6-7. 2. Multifactorial degenerative changes with  resultant multilevel foraminal narrowing as above. Notable findings include mild bilateral C4 and right C5 foraminal stenosis, severe left with moderate right C6 foraminal narrowing, with moderate right and mild to moderate left C7 foraminal stenosis.     Electronically Signed   By: Jeannine Boga M.D.   On: 04/17/2021 05:28   She reports that she quit smoking about 45 years ago. Her smoking use included cigarettes. She has a 12.00 pack-year smoking history. She has never used smokeless tobacco. No results for input(s): HGBA1C, LABURIC in the last 8760 hours.  Objective:  VS:  HT:    WT:   BMI:     BP:(!) 152/85  HR:71bpm  TEMP: ( )  RESP:  Physical Exam Vitals and nursing note reviewed.  Constitutional:      General: She is not in acute distress.    Appearance: Normal appearance. She is not ill-appearing.  HENT:     Head: Normocephalic and atraumatic.     Right Ear: External ear normal.     Left Ear: External ear normal.  Eyes:     Extraocular Movements: Extraocular movements intact.  Cardiovascular:     Rate and Rhythm: Normal rate.     Pulses: Normal pulses.  Musculoskeletal:        General: Tenderness present. No swelling.     Cervical back: Neck supple. Tenderness present. No rigidity.     Right lower leg: No edema.     Left lower leg: No edema.     Comments: Patient sits with forward flexed cervical spine.  Negative Spurling's test bilaterally.  Positive tender points and trigger points in the trapezius and levator scapula.  Patient has good strength in the upper extremities including 5 out of 5 strength in wrist extension long finger flexion and APB.  There is no atrophy of the hands intrinsically.  There is a negative Hoffmann's test.  Examination of the lumbar spine shows that the patient arises from a seating position without much difficulty but does have pain with extension and facet loading.  She is tender over the greater trochanters with positive tender  points.  She has good distal strength without clonus.   Lymphadenopathy:     Cervical: No cervical adenopathy.  Skin:    Findings: No erythema, lesion or rash.  Neurological:     General: No focal deficit present.     Mental Status: She is alert and oriented to person, place, and time.     Cranial Nerves: No cranial nerve deficit.     Sensory: No sensory deficit.     Motor: No weakness or abnormal muscle tone.  Coordination: Coordination normal.     Gait: Gait normal.  Psychiatric:        Mood and Affect: Mood normal.        Behavior: Behavior normal.    Ortho Exam  Imaging: No results found.  Past Medical/Family/Surgical/Social History: Medications & Allergies reviewed per EMR, new medications updated. Patient Active Problem List   Diagnosis Date Noted   Acid reflux 07/25/2020   Anemia, iron deficiency 07/25/2020   Avitaminosis D 07/25/2020   Cannot sleep 07/25/2020   Combined fat and carbohydrate induced hyperlipemia 07/25/2020   Cough 07/25/2020   Current drug use 07/25/2020   Discharge from the vagina 07/25/2020   Fast heart beat 07/25/2020   Fatigue 07/25/2020   Pain in female genitalia on intercourse 07/25/2020   Bilateral hip pain 05/29/2020   SVT (supraventricular tachycardia) ([place]) 05/30/2015   Atrophic vaginitis 07/26/2014   Hemorrhoids without complication 19/14/7829   Malignant neoplasm of upper-outer quadrant of left breast in female, estrogen receptor negative ([place]) 12/15/2013   Environmental allergies 05/21/2012   Rectal mucosa prolapse, anal skin tags 02/16/2012   HYPERCHOLESTEROLEMIA 03/28/2009   Anxiety state 03/28/2009   SUPRAVENTRICULAR TACHYCARDIA 03/28/2009   Past Medical History:  Diagnosis Date   Anxiety    probable panic attacks   Cancer (HCC)    breast   Dysphagia    Esophageal dysmotility    GERD (gastroesophageal reflux disease)    H/O hiatal hernia    Hyperlipidemia    Insomnia    Iron deficiency anemia    hx   Other  malaise and fatigue    Radiation 06/05/14-07/18/14   left breast 50.4 gray, lumpectomy cavity boosted to 60.4 gray   SVT (supraventricular tachycardia) (HCC)    documented-takes metoprolol as needed   Vitamin D deficiency    Family History  Problem Relation Age of Onset   Atrial fibrillation Mother 28   Hyperlipidemia Mother    Hypertension Mother    Cancer Father 15       bladder   Hypercholesterolemia Father    Hypertension Father    Cancer Maternal Aunt        breast   Colon cancer Neg Hx    CAD Neg Hx    Past Surgical History:  Procedure Laterality Date   CARPAL TUNNEL RELEASE     lt and rt   CHOLECYSTECTOMY  1999   COLONOSCOPY WITH PROPOFOL N/A 03/20/2015   Procedure: COLONOSCOPY WITH PROPOFOL;  Surgeon: Garlan Fair, MD;  Location: [facility];  Service: Endoscopy;  Laterality: N/A;   ELECTROPHYSIOLOGIC STUDY N/A 05/30/2015   Procedure: SVT Ablation;  Surgeon: Evans Lance, MD;  Location: [facility];  Service: Cardiovascular;  Laterality: N/A;   PARTIAL MASTECTOMY WITH NEEDLE LOCALIZATION AND AXILLARY SENTINEL LYMPH NODE BX Left 01/16/2014   Procedure: PARTIAL MASTECTOMY WITH NEEDLE LOCALIZATION AND AXILLARY SENTINEL LYMPH NODE BIOPSY;  Surgeon: [name], MD;  Location: [place];  Service: General;  Laterality: Left;   PORT-A-CATH REMOVAL Right 08/23/2014   Procedure: MINOR REMOVAL PORT-A-CATH;  Surgeon: Fanny Skates, MD;  Location: [place];  Service: General;  Laterality: Right;   PORTACATH PLACEMENT Right 02/28/2014   Procedure: INSERTION PORT-A-CATH;  Surgeon: [name], MD;  Location: [place];  Service: General;  Laterality: Right;   Social History   Occupational History   Occupation: realtor    Employer: [employer]  Tobacco Use   Smoking status: Former    Packs/day:  1.50    Years: 8.00    Pack years: 12.00    Types: Cigarettes    Quit date: 01/22/1976    Years since quitting: 45.3   Smokeless  tobacco: Never  Vaping Use   Vaping Use: Never used  Substance and Sexual Activity   Alcohol use: Yes    Alcohol/week: 7.0 standard drinks    Types: 7 Shots of liquor per week    Comment: 1 vodka per day   Drug use: No   Sexual activity: Yes    Birth control/protection: Post-menopausal

## 2021-06-02 NOTE — Procedures (Signed)
Cervical Epidural Steroid Injection - Interlaminar Approach with Fluoroscopic Guidance  Patient: Sheri Arellano      Date of Birth: 1951-05-11 MRN: [id number] PCP: Josetta Huddle, MD      Visit Date: 05/21/2021   Universal Protocol:    Date/Time: 06/02/2209:26 AM  Consent Given By: the patient  Position: PRONE  Additional Comments: Vital signs were monitored before and after the procedure. Patient was prepped and draped in the usual sterile fashion. The correct patient, procedure, and site was verified.   Injection Procedure Details:   Procedure diagnoses: Lumbar radiculopathy [M54.16]    Meds Administered:  Meds ordered this encounter  Medications   betamethasone acetate-betamethasone sodium phosphate (CELESTONE) injection 12 mg     Laterality: Right  Location/Site: C7-T1  Needle: 3.5 in., 20 ga. Tuohy  Needle Placement: Paramedian epidural space  Findings:  -Comments: Excellent flow of contrast into the epidural space.  Procedure Details: Using a paramedian approach from the side mentioned above, the region overlying the inferior lamina was localized under fluoroscopic visualization and the soft tissues overlying this structure were infiltrated with 4 ml. of 1% Lidocaine without Epinephrine. A # 20 gauge, Tuohy needle was inserted into the epidural space using a paramedian approach.  The epidural space was localized using loss of resistance along with contralateral oblique bi-planar fluoroscopic views.  After negative aspirate for air, blood, and CSF, a 2 ml. volume of Isovue-250 was injected into the epidural space and the flow of contrast was observed. Radiographs were obtained for documentation purposes.   The injectate was administered into the level noted above.  Additional Comments:  The patient tolerated the procedure well Dressing: 2 x 2 sterile gauze and Band-Aid    Post-procedure details: Patient was observed during the procedure. Post-procedure  instructions were reviewed.  Patient left the clinic in stable condition.

## 2021-06-05 ENCOUNTER — Telehealth: Payer: Self-pay | Admitting: Orthopaedic Surgery

## 2021-06-05 ENCOUNTER — Telehealth: Payer: Self-pay | Admitting: Physical Medicine and Rehabilitation

## 2021-06-05 NOTE — Telephone Encounter (Signed)
Received vm from pt, IC,lmvm for pt to [facility].

## 2021-06-05 NOTE — Telephone Encounter (Signed)
Pt called-she is scheduled for Bil hip injections 7/26, in a lot of pain. Is it possibility can it be moved up? Please call [phone]

## 2021-06-05 NOTE — Telephone Encounter (Signed)
Called pt and r/s 

## 2021-06-12 ENCOUNTER — Other Ambulatory Visit: Payer: Self-pay | Admitting: Physician Assistant

## 2021-06-13 ENCOUNTER — Telehealth: Payer: Self-pay | Admitting: Physical Medicine and Rehabilitation

## 2021-06-13 NOTE — Telephone Encounter (Signed)
Pt called and needs to reschedule appt. She will be out of town. Please call pt at [phone].

## 2021-06-14 DIAGNOSIS — G4700 Insomnia, unspecified: Secondary | ICD-10-CM | POA: Diagnosis not present

## 2021-06-14 DIAGNOSIS — E559 Vitamin D deficiency, unspecified: Secondary | ICD-10-CM | POA: Diagnosis not present

## 2021-06-14 DIAGNOSIS — D509 Iron deficiency anemia, unspecified: Secondary | ICD-10-CM | POA: Diagnosis not present

## 2021-06-14 DIAGNOSIS — K219 Gastro-esophageal reflux disease without esophagitis: Secondary | ICD-10-CM | POA: Diagnosis not present

## 2021-06-14 NOTE — Telephone Encounter (Signed)
rescheduled

## 2021-06-14 NOTE — Telephone Encounter (Signed)
Pt called wanting to make sure she gets her appt currently set for 06/19/21 resched.   [phone]

## 2021-06-17 DIAGNOSIS — D251 Intramural leiomyoma of uterus: Secondary | ICD-10-CM | POA: Diagnosis not present

## 2021-06-19 ENCOUNTER — Ambulatory Visit: Payer: Medicare Other | Admitting: Physical Medicine and Rehabilitation

## 2021-07-02 ENCOUNTER — Ambulatory Visit: Payer: Medicare Other | Admitting: Physical Medicine and Rehabilitation

## 2021-07-02 ENCOUNTER — Encounter: Payer: Self-pay | Admitting: Physical Medicine and Rehabilitation

## 2021-07-02 ENCOUNTER — Ambulatory Visit: Payer: Self-pay

## 2021-07-02 ENCOUNTER — Other Ambulatory Visit: Payer: Self-pay

## 2021-07-02 DIAGNOSIS — M25551 Pain in right hip: Secondary | ICD-10-CM | POA: Diagnosis not present

## 2021-07-02 DIAGNOSIS — M542 Cervicalgia: Secondary | ICD-10-CM

## 2021-07-02 DIAGNOSIS — M25552 Pain in left hip: Secondary | ICD-10-CM | POA: Diagnosis not present

## 2021-07-02 DIAGNOSIS — M7918 Myalgia, other site: Secondary | ICD-10-CM

## 2021-07-02 DIAGNOSIS — M7061 Trochanteric bursitis, right hip: Secondary | ICD-10-CM

## 2021-07-02 DIAGNOSIS — G894 Chronic pain syndrome: Secondary | ICD-10-CM

## 2021-07-02 DIAGNOSIS — M47816 Spondylosis without myelopathy or radiculopathy, lumbar region: Secondary | ICD-10-CM

## 2021-07-02 DIAGNOSIS — M7062 Trochanteric bursitis, left hip: Secondary | ICD-10-CM

## 2021-07-02 NOTE — Progress Notes (Signed)
armLateral hip pain both legs. "Hurts to touch it."  Numeric Pain Rating Scale and Functional Assessment Average Pain 7   In the last MONTH (on 0-10 scale) has pain interfered with the following?  1. General activity like being  able to carry out your everyday physical activities such as walking, climbing stairs, carrying groceries, or moving a chair?  Rating(10)    -Dye Allergies.

## 2021-07-02 NOTE — Progress Notes (Signed)
Sheri Arellano - 70 y.o. female MRN [id number]  Date of birth: 25-Dec-1950  Office Visit Note: Visit Date: 07/02/2021 PCP: Josetta Huddle, MD Referred by: Josetta Huddle, MD  Subjective: Chief Complaint  Patient presents with   Right Hip - Pain   Left Hip - Pain   HPI: Sheri Arellano is a 70 y.o. female who comes in today for evaluation of chronic, worsening and severe bilateral hip pain radiating to lateral thighs.  Patient reports chronic issues with bilateral hip pain for several years and describes pain as a soreness sensation. Patient reports pain is 7 out of 10 at present and increases with walking getting in and out of the car and sleeping on her left side.  Patient states she has tried multiple medications to help alleviate the pain such as Mobic and Tylenol #3, however patient does not feel like these medications work for her.  Patient reports she continues to do stretching exercises at home as tolerated, however she also feels that these do not help alleviate her pain.  Bilateral hips on recent pelvic MRI are unremarkable, no joint effusion noted and no significant degenerative changes. Patient's lumbar MRI from May exhibits facet arthropathy and mild bilateral foraminal narrowing at L4-L5. No high grade canal stenosis. Patient voices frustrations in the office today and reports that she is having difficulty performing daily tasks and feels like her prior treatments have not been successful.  Patient continues to see Dr. Eduard Roux for multiple orthopedic issues. Patient had bilateral ultrasound guided greater trochanter injections performed in 2021 by Dr. Eunice Blase. Patient reports these injections provided minimal relief. Patient reports she has been referred for physical therapy, but feels like this would increase her pain and is choosing not to go at this time.  Patient denies focal weakness, numbness and tingling.  Patient denies recent fall or trauma.   She does report good  relief with prior cervical epidural injection at the last visit.  She reports her symptoms are not totally gone but much much better.  Review of Systems  Musculoskeletal:  Positive for joint pain.  Neurological:  Negative for tingling and focal weakness.  All other systems reviewed and are negative. Otherwise per HPI.  Assessment & Plan: Visit Diagnoses:    ICD-10-CM   1. Pain in left hip  M25.552 XR C-ARM NO REPORT    Large Joint Inj: bilateral greater trochanter    2. Pain in right hip  M25.551 XR C-ARM NO REPORT    Large Joint Inj: bilateral greater trochanter    3. Spondylosis without myelopathy or radiculopathy, lumbar region  M47.816     4. Cervicalgia  M54.2     5. Myofascial pain syndrome  M79.18     6. Chronic pain syndrome  G89.4        Plan: Findings:  1. Chronic, worsening and severe bilateral hip pain radiating to lateral thighs.  Patient continues to have chronic issues with bilateral hip pain.  Prior pelvic MRI did not show any real significant degenerative changes of the hips.  Patient has failed conservative therapy and is unable to continue home exercise regimen due to increased pain.  Patient's clinical presentation is consistent with trochanteric bursitis and we feel the neck step is to perform bilateral trochanteric injections.  This would be done with fluoroscopic guidance.  Prior ultrasound-guided injections were minimally effective.  Patient is encouraged to continue medication regimen and stretching exercises as tolerated. If patient's pain is not improved we  will consider lumbar epidural steroid injection.  This would be bilateral L4-L5 transforaminal injection at the level of the lateral recess narrowing.  No red flag symptoms noted upon exam.   2.  Chronic neck and shoulder pain significantly relieved with cervical epidural injection.  She will continue to follow-up with Dr. Eduard Roux in terms of shoulders in particular but obviously could repeat this  injection depending on how long lived the relief is.   Meds & Orders: No orders of the defined types were placed in this encounter.   Orders Placed This Encounter  Procedures   Large Joint Inj: bilateral greater trochanter   XR C-ARM NO REPORT    Follow-up: Return if symptoms worsen or fail to improve.   Procedures: Large Joint Inj: bilateral greater trochanter on 07/02/2021 10:35 AM Indications: pain and diagnostic evaluation Details: 22 G 3.5 in needle, fluoroscopy-guided lateral approach  Arthrogram: No  Medications (Right): 5 mL bupivacaine 0.5 %; 40 mg triamcinolone acetonide 40 MG/ML Medications (Left): 5 mL bupivacaine 0.5 %; 40 mg triamcinolone acetonide 40 MG/ML Outcome: tolerated well, no immediate complications  There was excellent flow of contrast outlined the greater trochanteric bursa without vascular uptake. Procedure, treatment alternatives, risks and benefits explained, specific risks discussed. Consent was given by the patient. Immediately prior to procedure a time out was called to verify the correct patient, procedure, equipment, support staff and site/side marked as required. Patient was prepped and draped in the usual sterile fashion.         Clinical History: MRI LUMBAR SPINE WITHOUT CONTRAST     FINDINGS:    Alignment: Mild levoscoliosis. Small anterolisthesis of L2 over L3 and L4 over L5.    Disc levels:   T12-L1: No spinal canal or neural foraminal stenosis.   L1-2: Mild facet degenerative changes. No spinal canal or neural foraminal stenosis.   L2-3: Shallow disc bulge and moderate facet degenerative changes. No significant spinal canal or neural foraminal narrowing.   L3-4: Disc bulge, moderate facet degenerative changes and ligamentum flavum redundancy resulting mild spinal canal stenosis with mild narrowing of the bilateral subarticular zones and mild bilateral neural foraminal narrowing.   L4-5: Mild loss of disc height, disc bulge,  prominent hypertrophic facet degenerative changes and ligamentum flavum redundancy resulting in narrowing of the bilateral subarticular zones mild bilateral neural foraminal.   L5-S1: Mild right and moderate left facet degenerative changes. No significant spinal canal or neural foraminal stenosis.   IMPRESSION: 1. Degenerative changes of the lumbar spine, more pronounced at the facet joints, particularly at L4-5. 2. Mild spinal canal stenosis with mild narrowing of the bilateral subarticular zones mild bilateral neural foraminal narrowing at L3-4. 3. Moderate narrowing of the bilateral subarticular zones, right greater than left, and mild bilateral neural foraminal at L4-5.     Electronically Signed By: Pedro Earls M.D. On: 05/01/2021 09:09 ----- MRI CERVICAL SPINE WITHOUT CONTRAST   FINDINGS: Alignment: Mild straightening of the normal cervical lordosis. Trace anterolisthesis of C7 on T1 and T1 on T2.   Vertebrae: Vertebral body height maintained without acute or chronic fracture. Bone marrow signal intensity within normal limits. No discrete or worrisome osseous lesions. No abnormal marrow edema.   Cord: Normal signal and morphology.   Posterior Fossa, vertebral arteries, paraspinal tissues: No made of an empty sella. Visualized brain and posterior fossa otherwise unremarkable. Craniocervical junction within normal limits. Paraspinous and prevertebral soft tissues within normal limits. Normal flow voids seen within the vertebral arteries bilaterally.  Disc levels:   C2-C3: Minimal disc bulge. Mild left greater than right facet hypertrophy. No stenosis.   C3-C4: Negative interspace. Mild right greater than left facet hypertrophy. No spinal stenosis. Mild bilateral C4 foraminal narrowing.   C4-C5: Disc bulge with bilateral uncovertebral hypertrophy. Right-sided facet hypertrophy with ligament flavum thickening. No significant spinal stenosis. Mild  right C5 foraminal stenosis. Left neural foramina remains patent.   C5-C6: Degenerative intervertebral disc space narrowing with diffuse disc osteophyte complex. Flattening and partial effacement of the ventral thecal sac with resultant mild spinal stenosis. Severe left with moderate right C6 foraminal narrowing.   C6-C7: Degenerative intervertebral disc space narrowing with diffuse disc osteophyte complex. Flattening and partial effacement of the ventral thecal sac. Superimposed facet and ligament flavum hypertrophy. Mild spinal stenosis without cord impingement. Moderate right with mild to moderate left C7 foraminal stenosis.   C7-T1: Negative interspace. Left greater than right facet hypertrophy. No spinal stenosis. Mild left C8 foraminal narrowing. Right neural foramina remains patent.   Visualized upper thoracic spine demonstrates no significant finding.   IMPRESSION: 1. Multilevel cervical spondylosis with resultant mild spinal stenosis at C5-6 and C6-7. 2. Multifactorial degenerative changes with resultant multilevel foraminal narrowing as above. Notable findings include mild bilateral C4 and right C5 foraminal stenosis, severe left with moderate right C6 foraminal narrowing, with moderate right and mild to moderate left C7 foraminal stenosis.     Electronically Signed   By: Jeannine Boga M.D.   On: 04/17/2021 05:28   She reports that she quit smoking about 45 years ago. Her smoking use included cigarettes. She has a 12.00 pack-year smoking history. She has never used smokeless tobacco. No results for input(s): HGBA1C, LABURIC in the last 8760 hours.  Objective:  VS:  HT:    WT:   BMI:     BP:   HR: bpm  TEMP: ( )  RESP:  Physical Exam HENT:     Head: Normocephalic.     Right Ear: Tympanic membrane normal.     Left Ear: Tympanic membrane normal.     Nose: Nose normal.     Mouth/Throat:     Mouth: Mucous membranes are moist.  Eyes:     Pupils: Pupils  are equal, round, and reactive to light.  Cardiovascular:     Rate and Rhythm: Normal rate.     Pulses: Normal pulses.  Pulmonary:     Effort: Pulmonary effort is normal.  Abdominal:     General: Abdomen is flat. There is no distension.  Musculoskeletal:     Cervical back: Normal range of motion and neck supple.     Comments: Bilateral greater trochanters tender to palpation. Pain noted with abduction and external rotation of bilateral hips. Sensation intact bilaterally. 5/5 strength noted with bilateral hip flexion. Pt ambulates independently, gait steady.     Skin:    General: Skin is warm and dry.     Capillary Refill: Capillary refill takes less than 2 seconds.  Neurological:     General: No focal deficit present.     Mental Status: She is alert.  Psychiatric:        Mood and Affect: Mood normal.    Ortho Exam  Imaging: XR C-ARM NO REPORT  Result Date: 07/02/2021 Please see Notes tab for imaging impression.   Past Medical/Family/Surgical/Social History: Medications & Allergies reviewed per EMR, new medications updated. Patient Active Problem List   Diagnosis Date Noted   Greater trochanteric bursitis, right 07/03/2021   Acid  reflux 07/25/2020   Anemia, iron deficiency 07/25/2020   Avitaminosis D 07/25/2020   Cannot sleep 07/25/2020   Combined fat and carbohydrate induced hyperlipemia 07/25/2020   Cough 07/25/2020   Current drug use 07/25/2020   Discharge from the vagina 07/25/2020   Fast heart beat 07/25/2020   Fatigue 07/25/2020   Pain in female genitalia on intercourse 07/25/2020   Bilateral hip pain 05/29/2020   SVT (supraventricular tachycardia) ([place]) 05/30/2015   Atrophic vaginitis 07/26/2014   Hemorrhoids without complication [id number]   Malignant neoplasm of upper-outer quadrant of left breast in female, estrogen receptor negative ([place]) 12/15/2013   Environmental allergies 05/21/2012   Rectal mucosa prolapse, anal skin tags 02/16/2012    HYPERCHOLESTEROLEMIA 03/28/2009   Anxiety state 03/28/2009   SUPRAVENTRICULAR TACHYCARDIA 03/28/2009   Past Medical History:  Diagnosis Date   Anxiety    probable panic attacks   Cancer (HCC)    breast   Dysphagia    Esophageal dysmotility    GERD (gastroesophageal reflux disease)    H/O hiatal hernia    Hyperlipidemia    Insomnia    Iron deficiency anemia    hx   Other malaise and fatigue    Radiation 06/05/14-07/18/14   left breast 50.4 gray, lumpectomy cavity boosted to 60.4 gray   SVT (supraventricular tachycardia) (HCC)    documented-takes metoprolol as needed   Vitamin D deficiency    Family History  Problem Relation Age of Onset   Atrial fibrillation Mother 56   Hyperlipidemia Mother    Hypertension Mother    Cancer Father 33       bladder   Hypercholesterolemia Father    Hypertension Father    Cancer Maternal Aunt        breast   Colon cancer Neg Hx    CAD Neg Hx    Past Surgical History:  Procedure Laterality Date   CARPAL TUNNEL RELEASE     lt and rt   CHOLECYSTECTOMY  1999   COLONOSCOPY WITH PROPOFOL N/A 03/20/2015   Procedure: COLONOSCOPY WITH PROPOFOL;  Surgeon: Garlan Fair, MD;  Location: [facility];  Service: Endoscopy;  Laterality: N/A;   ELECTROPHYSIOLOGIC STUDY N/A 05/30/2015   Procedure: SVT Ablation;  Surgeon: Evans Lance, MD;  Location: [facility];  Service: Cardiovascular;  Laterality: N/A;   PARTIAL MASTECTOMY WITH NEEDLE LOCALIZATION AND AXILLARY SENTINEL LYMPH NODE BX Left 01/16/2014   Procedure: PARTIAL MASTECTOMY WITH NEEDLE LOCALIZATION AND AXILLARY SENTINEL LYMPH NODE BIOPSY;  Surgeon: [name], MD;  Location: [facility];  Service: General;  Laterality: Left;   PORT-A-CATH REMOVAL Right 08/23/2014   Procedure: MINOR REMOVAL PORT-A-CATH;  Surgeon: Fanny Skates, MD;  Location: [place] [facility];  Service: General;  Laterality: Right;   PORTACATH PLACEMENT Right 02/28/2014   Procedure: INSERTION  PORT-A-CATH;  Surgeon: [name], MD;  Location: [place];  Service: General;  Laterality: Right;   Social History   Occupational History   Occupation: realtor    Employer: [employer]  Tobacco Use   Smoking status: Former    Packs/day: 1.50    Years: 8.00    Pack years: 12.00    Types: Cigarettes    Quit date: 01/22/1976    Years since quitting: 45.4   Smokeless tobacco: Never  Vaping Use   Vaping Use: Never used  Substance and Sexual Activity   Alcohol use: Yes    Alcohol/week: 7.0 standard drinks    Types: 7 Shots of  liquor per week    Comment: 1 vodka per day   Drug use: No   Sexual activity: Yes    Birth control/protection: Post-menopausal

## 2021-07-03 DIAGNOSIS — M7061 Trochanteric bursitis, right hip: Secondary | ICD-10-CM | POA: Insufficient documentation

## 2021-07-03 MED ORDER — TRIAMCINOLONE ACETONIDE 40 MG/ML IJ SUSP
40.0000 mg | INTRAMUSCULAR | Status: AC | PRN
  Administered 2021-07-02: 40 mg via INTRA_ARTICULAR

## 2021-07-03 MED ORDER — BUPIVACAINE HCL 0.5 % IJ SOLN
5.0000 mL | INTRAMUSCULAR | Status: AC | PRN
  Administered 2021-07-02: 5 mL via INTRA_ARTICULAR

## 2021-07-22 ENCOUNTER — Other Ambulatory Visit: Payer: Self-pay | Admitting: Orthopaedic Surgery

## 2021-07-24 DIAGNOSIS — Z411 Encounter for cosmetic surgery: Secondary | ICD-10-CM | POA: Diagnosis not present

## 2021-07-24 DIAGNOSIS — L570 Actinic keratosis: Secondary | ICD-10-CM | POA: Diagnosis not present

## 2021-08-14 DIAGNOSIS — E876 Hypokalemia: Secondary | ICD-10-CM | POA: Diagnosis not present

## 2021-08-15 DIAGNOSIS — M5416 Radiculopathy, lumbar region: Secondary | ICD-10-CM | POA: Diagnosis not present

## 2021-08-15 DIAGNOSIS — I700 Atherosclerosis of aorta: Secondary | ICD-10-CM | POA: Diagnosis not present

## 2021-08-15 DIAGNOSIS — M4800 Spinal stenosis, site unspecified: Secondary | ICD-10-CM | POA: Diagnosis not present

## 2021-08-15 DIAGNOSIS — M545 Low back pain, unspecified: Secondary | ICD-10-CM | POA: Diagnosis not present

## 2021-09-17 DIAGNOSIS — M4800 Spinal stenosis, site unspecified: Secondary | ICD-10-CM | POA: Diagnosis not present

## 2021-09-17 DIAGNOSIS — I10 Essential (primary) hypertension: Secondary | ICD-10-CM | POA: Diagnosis not present

## 2021-09-17 DIAGNOSIS — E782 Mixed hyperlipidemia: Secondary | ICD-10-CM | POA: Diagnosis not present

## 2021-09-17 DIAGNOSIS — I700 Atherosclerosis of aorta: Secondary | ICD-10-CM | POA: Diagnosis not present

## 2021-10-08 ENCOUNTER — Telehealth: Payer: Self-pay | Admitting: Physical Medicine and Rehabilitation

## 2021-10-08 NOTE — Telephone Encounter (Signed)
Pt called asking for a CB to set an appt.  [phone]

## 2021-10-09 ENCOUNTER — Telehealth: Payer: Self-pay | Admitting: Physical Medicine and Rehabilitation

## 2021-10-09 NOTE — Telephone Encounter (Signed)
See previous message

## 2021-10-09 NOTE — Telephone Encounter (Signed)
Patient would like appointments for two different issues. She would like repeat bilateral greater trochanteric bursa injections (last done 7/26) and repeat right C7-T1 IL (6/14). Ok to repeat both?

## 2021-10-09 NOTE — Telephone Encounter (Signed)
Pt states she is wanting the injection in the middle of her back and both hips.   Cb [phone]

## 2021-10-09 NOTE — Telephone Encounter (Signed)
Left message #1 to discuss.

## 2021-10-10 NOTE — Telephone Encounter (Signed)
Left message #1 to schedule appointments 2 weeks apart for injections.

## 2021-10-22 DIAGNOSIS — I10 Essential (primary) hypertension: Secondary | ICD-10-CM | POA: Diagnosis not present

## 2021-10-22 DIAGNOSIS — E782 Mixed hyperlipidemia: Secondary | ICD-10-CM | POA: Diagnosis not present

## 2021-10-22 DIAGNOSIS — M4800 Spinal stenosis, site unspecified: Secondary | ICD-10-CM | POA: Diagnosis not present

## 2021-10-22 DIAGNOSIS — I700 Atherosclerosis of aorta: Secondary | ICD-10-CM | POA: Diagnosis not present

## 2021-10-28 ENCOUNTER — Ambulatory Visit: Payer: Medicare Other | Admitting: Physical Medicine and Rehabilitation

## 2021-10-28 ENCOUNTER — Ambulatory Visit: Payer: Self-pay

## 2021-10-28 ENCOUNTER — Other Ambulatory Visit: Payer: Self-pay

## 2021-10-28 VITALS — BP 124/81 | HR 93

## 2021-10-28 DIAGNOSIS — M5412 Radiculopathy, cervical region: Secondary | ICD-10-CM

## 2021-10-28 MED ORDER — BETAMETHASONE SOD PHOS & ACET 6 (3-3) MG/ML IJ SUSP
12.0000 mg | Freq: Once | INTRAMUSCULAR | Status: AC
  Administered 2021-10-28: 12 mg

## 2021-10-28 NOTE — Patient Instructions (Signed)

## 2021-10-28 NOTE — Progress Notes (Signed)
Pt state neck pain that travels to her right arm and mid back. Pt state any movement with her neck. Pt state takes pain meds to help ease her pain. Pt has hx of inj on 05/21/21 pt state it helped.  Numeric Pain Rating Scale and Functional Assessment Average Pain 10   In the last MONTH (on 0-10 scale) has pain interfered with the following?  1. General activity like being  able to carry out your everyday physical activities such as walking, climbing stairs, carrying groceries, or moving a chair?  Rating(10)   +Driver, -BT, -Dye Allergies.

## 2021-10-28 NOTE — Procedures (Signed)
Cervical Epidural Steroid Injection - Interlaminar Approach with Fluoroscopic Guidance  Patient: Sheri Arellano      Date of Birth: 1951/05/30 MRN: [id number] PCP: Josetta Huddle, MD      Visit Date: 10/28/2021   Universal Protocol:    Date/Time: 11/21/222:06 PM  Consent Given By: the patient  Position: PRONE  Additional Comments: Vital signs were monitored before and after the procedure. Patient was prepped and draped in the usual sterile fashion. The correct patient, procedure, and site was verified.   Injection Procedure Details:   Procedure diagnoses: Cervical radiculopathy [M54.12]    Meds Administered:  Meds ordered this encounter  Medications   betamethasone acetate-betamethasone sodium phosphate (CELESTONE) injection 12 mg     Laterality: Right  Location/Site: C7-T1  Needle: 3.5 in., 20 ga. Tuohy  Needle Placement: Paramedian epidural space  Findings:  -Comments: Excellent flow of contrast into the epidural space.  Procedure Details: Using a paramedian approach from the side mentioned above, the region overlying the inferior lamina was localized under fluoroscopic visualization and the soft tissues overlying this structure were infiltrated with 4 ml. of 1% Lidocaine without Epinephrine. A # 20 gauge, Tuohy needle was inserted into the epidural space using a paramedian approach.  The epidural space was localized using loss of resistance along with contralateral oblique bi-planar fluoroscopic views.  After negative aspirate for air, blood, and CSF, a 2 ml. volume of Isovue-250 was injected into the epidural space and the flow of contrast was observed. Radiographs were obtained for documentation purposes.   The injectate was administered into the level noted above.  Additional Comments:  The patient tolerated the procedure well Dressing: 2 x 2 sterile gauze and Band-Aid    Post-procedure details: Patient was observed during the procedure. Post-procedure  instructions were reviewed.  Patient left the clinic in stable condition.

## 2021-10-29 NOTE — Progress Notes (Signed)
Sheri Arellano - 70 y.o. female MRN [id number]  Date of birth: 06-Oct-1951  Office Visit Note: Visit Date: 10/28/2021 PCP: Josetta Huddle, MD Referred by: Josetta Huddle, MD  Subjective: Chief Complaint  Patient presents with   Neck - Pain   Right Arm - Pain   Middle Back - Pain   HPI:  Sheri Arellano is a 70 y.o. female who comes in today for planned repeat Right C7-T1  Cervical Interlaminar epidural steroid injection with fluoroscopic guidance.  The patient has failed conservative care including home exercise, medications, time and activity modification.  This injection will be diagnostic and hopefully therapeutic.  Please see requesting physician notes for further details and justification. Patient received more than 50% pain relief from prior injection. MRI reviewed with images and spine model.  MRI reviewed in the note below.  Interestingly, there was some confusion with the patient referring to this is a back injection and she does get middle upper back pain as well but does not seem to be an understanding of the fact that we have completed epidural injection of the cervical spine which has given her relief of her arm and shoulder blade pain.  I did explain that the injection seem to have done quite well for her and that is where we placed it on 2 occasions.  This is technically a cervical injection although it does feel like the injection is placed in the upper back.  Referring: Dr. Eduard Roux  ROS Otherwise per HPI.  Assessment & Plan: Visit Diagnoses:    ICD-10-CM   1. Cervical radiculopathy  M54.12 XR C-ARM NO REPORT    Epidural Steroid injection    betamethasone acetate-betamethasone sodium phosphate (CELESTONE) injection 12 mg      Plan: No additional findings.   Meds & Orders:  Meds ordered this encounter  Medications   betamethasone acetate-betamethasone sodium phosphate (CELESTONE) injection 12 mg    Orders Placed This Encounter  Procedures   XR C-ARM NO  REPORT   Epidural Steroid injection    Follow-up: Return if symptoms worsen or fail to improve.   Procedures: No procedures performed  Cervical Epidural Steroid Injection - Interlaminar Approach with Fluoroscopic Guidance  Patient: Sheri Arellano      Date of Birth: [birth year] MRN: [id number] PCP: Josetta Huddle, MD      Visit Date: 10/28/2021   Universal Protocol:    Date/Time: 11/21/222:06 PM  Consent Given By: the patient  Position: PRONE  Additional Comments: Vital signs were monitored before and after the procedure. Patient was prepped and draped in the usual sterile fashion. The correct patient, procedure, and site was verified.   Injection Procedure Details:   Procedure diagnoses: Cervical radiculopathy [M54.12]    Meds Administered:  Meds ordered this encounter  Medications   betamethasone acetate-betamethasone sodium phosphate (CELESTONE) injection 12 mg     Laterality: Right  Location/Site: C7-T1  Needle: 3.5 in., 20 ga. Tuohy  Needle Placement: Paramedian epidural space  Findings:  -Comments: Excellent flow of contrast into the epidural space.  Procedure Details: Using a paramedian approach from the side mentioned above, the region overlying the inferior lamina was localized under fluoroscopic visualization and the soft tissues overlying this structure were infiltrated with 4 ml. of 1% Lidocaine without Epinephrine. A # 20 gauge, Tuohy needle was inserted into the epidural space using a paramedian approach.  The epidural space was localized using loss of resistance along with contralateral oblique bi-planar fluoroscopic views.  After negative aspirate  for air, blood, and CSF, a 2 ml. volume of Isovue-250 was injected into the epidural space and the flow of contrast was observed. Radiographs were obtained for documentation purposes.   The injectate was administered into the level noted above.  Additional Comments:  The patient tolerated the procedure  well Dressing: 2 x 2 sterile gauze and Band-Aid    Post-procedure details: Patient was observed during the procedure. Post-procedure instructions were reviewed.  Patient left the clinic in stable condition.   Clinical History: MRI LUMBAR SPINE WITHOUT CONTRAST     FINDINGS:    Alignment: Mild levoscoliosis. Small anterolisthesis of L2 over L3 and L4 over L5.    Disc levels:   T12-L1: No spinal canal or neural foraminal stenosis.   L1-2: Mild facet degenerative changes. No spinal canal or neural foraminal stenosis.   L2-3: Shallow disc bulge and moderate facet degenerative changes. No significant spinal canal or neural foraminal narrowing.   L3-4: Disc bulge, moderate facet degenerative changes and ligamentum flavum redundancy resulting mild spinal canal stenosis with mild narrowing of the bilateral subarticular zones and mild bilateral neural foraminal narrowing.   L4-5: Mild loss of disc height, disc bulge, prominent hypertrophic facet degenerative changes and ligamentum flavum redundancy resulting in narrowing of the bilateral subarticular zones mild bilateral neural foraminal.   L5-S1: Mild right and moderate left facet degenerative changes. No significant spinal canal or neural foraminal stenosis.   IMPRESSION: 1. Degenerative changes of the lumbar spine, more pronounced at the facet joints, particularly at L4-5. 2. Mild spinal canal stenosis with mild narrowing of the bilateral subarticular zones mild bilateral neural foraminal narrowing at L3-4. 3. Moderate narrowing of the bilateral subarticular zones, right greater than left, and mild bilateral neural foraminal at L4-5.     Electronically Signed By: Pedro Earls M.D. On: 05/01/2021 09:09 ----- MRI CERVICAL SPINE WITHOUT CONTRAST   FINDINGS: Alignment: Mild straightening of the normal cervical lordosis. Trace anterolisthesis of C7 on T1 and T1 on T2.   Vertebrae: Vertebral body  height maintained without acute or chronic fracture. Bone marrow signal intensity within normal limits. No discrete or worrisome osseous lesions. No abnormal marrow edema.   Cord: Normal signal and morphology.   Posterior Fossa, vertebral arteries, paraspinal tissues: No made of an empty sella. Visualized brain and posterior fossa otherwise unremarkable. Craniocervical junction within normal limits. Paraspinous and prevertebral soft tissues within normal limits. Normal flow voids seen within the vertebral arteries bilaterally.   Disc levels:   C2-C3: Minimal disc bulge. Mild left greater than right facet hypertrophy. No stenosis.   C3-C4: Negative interspace. Mild right greater than left facet hypertrophy. No spinal stenosis. Mild bilateral C4 foraminal narrowing.   C4-C5: Disc bulge with bilateral uncovertebral hypertrophy. Right-sided facet hypertrophy with ligament flavum thickening. No significant spinal stenosis. Mild right C5 foraminal stenosis. Left neural foramina remains patent.   C5-C6: Degenerative intervertebral disc space narrowing with diffuse disc osteophyte complex. Flattening and partial effacement of the ventral thecal sac with resultant mild spinal stenosis. Severe left with moderate right C6 foraminal narrowing.   C6-C7: Degenerative intervertebral disc space narrowing with diffuse disc osteophyte complex. Flattening and partial effacement of the ventral thecal sac. Superimposed facet and ligament flavum hypertrophy. Mild spinal stenosis without cord impingement. Moderate right with mild to moderate left C7 foraminal stenosis.   C7-T1: Negative interspace. Left greater than right facet hypertrophy. No spinal stenosis. Mild left C8 foraminal narrowing. Right neural foramina remains patent.   Visualized upper thoracic  spine demonstrates no significant finding.   IMPRESSION: 1. Multilevel cervical spondylosis with resultant mild spinal stenosis at C5-6  and C6-7. 2. Multifactorial degenerative changes with resultant multilevel foraminal narrowing as above. Notable findings include mild bilateral C4 and right C5 foraminal stenosis, severe left with moderate right C6 foraminal narrowing, with moderate right and mild to moderate left C7 foraminal stenosis.     Electronically Signed   By: Jeannine Boga M.D.   On: 04/17/2021 05:28     Objective:  VS:  HT:    WT:   BMI:     BP:124/81  HR:93bpm  TEMP: ( )  RESP:  Physical Exam Vitals and nursing note reviewed.  Constitutional:      General: She is not in acute distress.    Appearance: Normal appearance. She is not ill-appearing.  HENT:     Head: Normocephalic and atraumatic.     Right Ear: External ear normal.     Left Ear: External ear normal.  Eyes:     Extraocular Movements: Extraocular movements intact.  Cardiovascular:     Rate and Rhythm: Normal rate.     Pulses: Normal pulses.  Musculoskeletal:     Cervical back: Tenderness present. No rigidity.     Right lower leg: No edema.     Left lower leg: No edema.     Comments: Patient has good strength in the upper extremities including 5 out of 5 strength in wrist extension long finger flexion and APB.  There is no atrophy of the hands intrinsically.  There is a negative Hoffmann's test.   Lymphadenopathy:     Cervical: No cervical adenopathy.  Skin:    Findings: No erythema, lesion or rash.  Neurological:     General: No focal deficit present.     Mental Status: She is alert and oriented to person, place, and time.     Sensory: No sensory deficit.     Motor: No weakness or abnormal muscle tone.     Coordination: Coordination normal.  Psychiatric:        Mood and Affect: Mood normal.        Behavior: Behavior normal.     Imaging: XR C-ARM NO REPORT  Result Date: 10/28/2021 Please see Notes tab for imaging impression.

## 2021-11-11 ENCOUNTER — Other Ambulatory Visit: Payer: Self-pay

## 2021-11-11 ENCOUNTER — Ambulatory Visit (INDEPENDENT_AMBULATORY_CARE_PROVIDER_SITE_OTHER): Payer: Medicare Other | Admitting: Physical Medicine and Rehabilitation

## 2021-11-11 ENCOUNTER — Ambulatory Visit: Payer: Self-pay

## 2021-11-11 ENCOUNTER — Encounter: Payer: Self-pay | Admitting: Physical Medicine and Rehabilitation

## 2021-11-11 VITALS — BP 152/92 | HR 74

## 2021-11-11 DIAGNOSIS — M1611 Unilateral primary osteoarthritis, right hip: Secondary | ICD-10-CM

## 2021-11-11 DIAGNOSIS — M7062 Trochanteric bursitis, left hip: Secondary | ICD-10-CM

## 2021-11-11 NOTE — Progress Notes (Signed)
Numeric Pain Rating Scale and Functional Assessment Average Pain 7   In the last MONTH (on 0-10 scale) has pain interfered with the following?  1. General activity like being  able to carry out your everyday physical activities such as walking, climbing stairs, carrying groceries, or moving a chair?  Rating(7)   +Driver, -BT, -Dye Allergies.   Patient is here for bil hip pain. Takes Celebrex and Tylenol. Hurts to lay on her side. States its worse when walking. Pain in buttock. Should she walk? Last injection was given 07/02/2021.

## 2021-11-11 NOTE — Progress Notes (Signed)
Sheri Arellano - 70 y.o. female MRN [id number]  Date of birth: [birth year]  Office Visit Note: Visit Date: 11/11/2021 PCP: Josetta Huddle, MD Referred by: Josetta Huddle, MD  Subjective: Chief Complaint  Patient presents with   Right Hip - Pain   Left Hip - Pain   HPI:  Sheri Arellano is a 70 y.o. female who comes in today for planned repeat Bilateral anesthetic greater trochanteric bursa injections with fluoroscopic guidance.  The patient has failed conservative care including home exercise, medications, time and activity modification. Prior injection gave more than 50% relief for several months. This injection will be diagnostic and hopefully therapeutic.  Please see requesting physician notes for further details and justification.  Referring: Dr. Eduard Roux   ROS Otherwise per HPI.  Assessment & Plan: Visit Diagnoses:    ICD-10-CM   1. Greater trochanteric bursitis, left  M70.62 Large Joint Inj: bilateral greater trochanter    XR C-ARM NO REPORT    2. Unilateral primary osteoarthritis, right hip  M16.11 Large Joint Inj: bilateral greater trochanter    XR C-ARM NO REPORT      Plan: No additional findings.   Meds & Orders: No orders of the defined types were placed in this encounter.    Orders Placed This Encounter  Procedures   Large Joint Inj: bilateral greater trochanter   XR C-ARM NO REPORT    Follow-up: Return if symptoms worsen or fail to improve.   Procedures: Large Joint Inj: bilateral greater trochanter on 11/11/2021 2:11 PM Indications: pain and diagnostic evaluation Details: 22 G 3.5 in needle, lateral approach  Arthrogram: No  Medications (Right): 40 mg triamcinolone acetonide 40 MG/ML; 5 mL bupivacaine 0.25 % Medications (Left): 40 mg triamcinolone acetonide 40 MG/ML; 5 mL bupivacaine 0.25 % Outcome: tolerated well, no immediate complications  Greatest area of pain over the greater trochanter was palpated and marked prior to injection. The patient  did seem to have relief after the injection. Procedure, treatment alternatives, risks and benefits explained, specific risks discussed. Consent was given by the patient. Immediately prior to procedure a time out was called to verify the correct patient, procedure, equipment, support staff and site/side marked as required. Patient was prepped and draped in the usual sterile fashion.         Clinical History: MRI LUMBAR SPINE WITHOUT CONTRAST     FINDINGS:    Alignment: Mild levoscoliosis. Small anterolisthesis of L2 over L3 and L4 over L5.    Disc levels:   T12-L1: No spinal canal or neural foraminal stenosis.   L1-2: Mild facet degenerative changes. No spinal canal or neural foraminal stenosis.   L2-3: Shallow disc bulge and moderate facet degenerative changes. No significant spinal canal or neural foraminal narrowing.   L3-4: Disc bulge, moderate facet degenerative changes and ligamentum flavum redundancy resulting mild spinal canal stenosis with mild narrowing of the bilateral subarticular zones and mild bilateral neural foraminal narrowing.   L4-5: Mild loss of disc height, disc bulge, prominent hypertrophic facet degenerative changes and ligamentum flavum redundancy resulting in narrowing of the bilateral subarticular zones mild bilateral neural foraminal.   L5-S1: Mild right and moderate left facet degenerative changes. No significant spinal canal or neural foraminal stenosis.   IMPRESSION: 1. Degenerative changes of the lumbar spine, more pronounced at the facet joints, particularly at L4-5. 2. Mild spinal canal stenosis with mild narrowing of the bilateral subarticular zones mild bilateral neural foraminal narrowing at L3-4. 3. Moderate narrowing of the bilateral subarticular  zones, right greater than left, and mild bilateral neural foraminal at L4-5.     Electronically Signed By: Pedro Earls M.D. On: 05/01/2021 09:09 ----- MRI CERVICAL  SPINE WITHOUT CONTRAST   FINDINGS: Alignment: Mild straightening of the normal cervical lordosis. Trace anterolisthesis of C7 on T1 and T1 on T2.   Vertebrae: Vertebral body height maintained without acute or chronic fracture. Bone marrow signal intensity within normal limits. No discrete or worrisome osseous lesions. No abnormal marrow edema.   Cord: Normal signal and morphology.   Posterior Fossa, vertebral arteries, paraspinal tissues: No made of an empty sella. Visualized brain and posterior fossa otherwise unremarkable. Craniocervical junction within normal limits. Paraspinous and prevertebral soft tissues within normal limits. Normal flow voids seen within the vertebral arteries bilaterally.   Disc levels:   C2-C3: Minimal disc bulge. Mild left greater than right facet hypertrophy. No stenosis.   C3-C4: Negative interspace. Mild right greater than left facet hypertrophy. No spinal stenosis. Mild bilateral C4 foraminal narrowing.   C4-C5: Disc bulge with bilateral uncovertebral hypertrophy. Right-sided facet hypertrophy with ligament flavum thickening. No significant spinal stenosis. Mild right C5 foraminal stenosis. Left neural foramina remains patent.   C5-C6: Degenerative intervertebral disc space narrowing with diffuse disc osteophyte complex. Flattening and partial effacement of the ventral thecal sac with resultant mild spinal stenosis. Severe left with moderate right C6 foraminal narrowing.   C6-C7: Degenerative intervertebral disc space narrowing with diffuse disc osteophyte complex. Flattening and partial effacement of the ventral thecal sac. Superimposed facet and ligament flavum hypertrophy. Mild spinal stenosis without cord impingement. Moderate right with mild to moderate left C7 foraminal stenosis.   C7-T1: Negative interspace. Left greater than right facet hypertrophy. No spinal stenosis. Mild left C8 foraminal narrowing. Right neural foramina remains  patent.   Visualized upper thoracic spine demonstrates no significant finding.   IMPRESSION: 1. Multilevel cervical spondylosis with resultant mild spinal stenosis at C5-6 and C6-7. 2. Multifactorial degenerative changes with resultant multilevel foraminal narrowing as above. Notable findings include mild bilateral C4 and right C5 foraminal stenosis, severe left with moderate right C6 foraminal narrowing, with moderate right and mild to moderate left C7 foraminal stenosis.     Electronically Signed   By: Jeannine Boga M.D.   On: 04/17/2021 05:28     Objective:  VS:  HT:    WT:   BMI:     BP:(!) 152/92  HR:74bpm  TEMP: ( )  RESP:  Physical Exam   Imaging: No results found.

## 2021-11-11 NOTE — Patient Instructions (Signed)

## 2021-11-12 DIAGNOSIS — E871 Hypo-osmolality and hyponatremia: Secondary | ICD-10-CM | POA: Diagnosis not present

## 2021-11-12 DIAGNOSIS — E782 Mixed hyperlipidemia: Secondary | ICD-10-CM | POA: Diagnosis not present

## 2021-11-24 MED ORDER — TRIAMCINOLONE ACETONIDE 40 MG/ML IJ SUSP
40.0000 mg | INTRAMUSCULAR | Status: AC | PRN
  Administered 2021-11-11: 14:00:00 40 mg via INTRA_ARTICULAR

## 2021-11-24 MED ORDER — BUPIVACAINE HCL 0.25 % IJ SOLN
5.0000 mL | INTRAMUSCULAR | Status: AC | PRN
  Administered 2021-11-11: 14:00:00 5 mL via INTRA_ARTICULAR

## 2021-11-24 MED ORDER — BUPIVACAINE HCL 0.25 % IJ SOLN
5.0000 mL | INTRAMUSCULAR | Status: AC | PRN
Start: 2021-11-11 — End: 2021-11-11
  Administered 2021-11-11: 14:00:00 5 mL via INTRA_ARTICULAR

## 2021-12-10 ENCOUNTER — Telehealth: Payer: Self-pay | Admitting: Interventional Cardiology

## 2021-12-10 NOTE — Telephone Encounter (Signed)
I spoke with patient.  She reports feeling tired and short of breath the month of December.  Shortness of breath worsened over the weekend.  Occurs with any activity. On Sunday she felt dizzy.  BP is OK.  Does not think heart rate is irregular but it is not fast.  Saw PCP in the last few weeks and is being treated for low sodium levels. Patient has been scheduled to see Dr Irish Lack on 12/12/21.  She is aware to go to ED if symptoms worsen prior to this appointment.

## 2021-12-10 NOTE — Telephone Encounter (Signed)
Pt c/o Shortness Of Breath: STAT if SOB developed within the last 24 hours or pt is noticeably SOB on the phone  1. Are you currently SOB (can you hear that pt is SOB on the phone)? yes  2. How long have you been experiencing SOB?  Started before Christmas- got worse this weekend  3. Are you SOB when sitting or when up moving around? both  4. Are you currently experiencing any other symptoms?  Dizziness- patient wanted to be seen asap- I made her an appointment for Thursday(12-12-21)- please evaluate to see if patient needs a sooner appointment-

## 2021-12-11 NOTE — Progress Notes (Signed)
Cardiology Office Note   Date:  12/12/2021   ID:  Sheri Arellano, DOB 1951/11/16, MRN [id number]  PCP:  Marden Noble, MD    Chief Complaint  Patient presents with   Follow-up   DOE  Wt Readings from Last 3 Encounters:  12/12/21 158 lb (71.7 kg)  02/22/21 154 lb 8 oz (70.1 kg)  02/07/20 164 lb 3.2 oz (74.5 kg)       History of Present Illness: Sheri Arellano is a 71 y.o. female who is being seen today for the evaluation of DOE at the request of Marden Noble, MD.    Electrophysiology study and radiofrequency catheter ablation on 05/30/15 by Dr Ladona Ridgel.  This study demonstrated inducible AVNRT with successful slow pathway modification.  There were no inducible arrhythmias following ablation and no early apparent complications.    She did well for a few years since then.   She has had some DOE in 2020.  Her father has had stents.  Husband had a stent.   CTA in 2020 showed: "Calcium score: No calcium noted   Coronary Arteries: Right dominant with no anomalies   LM: Normal   LAD: Normal   IM: Large branching vessel normal   D1: Normal   Circumflex: Normal   OM1: Small vessel normal   AV Groove: Small and normal   RCA: Less than 30% soft plaque in mid vessel   PDA: Normal   PLA: Normal   IMPRESSION: 1.  Calcium score 0   2. Essentially normal right dominant coronary arteries with less than 30% soft plaque in mid RCA   3.  Normal aortic root diameter 3.6 cm"  Over the past few weeks, she has been more short of breath.  She has had some dizziness.  She had not been active prior this due to arthritis.   Past Medical History:  Diagnosis Date   Anxiety    probable panic attacks   Cancer (HCC)    breast   Dysphagia    Esophageal dysmotility    GERD (gastroesophageal reflux disease)    H/O hiatal hernia    Hyperlipidemia    Insomnia    Iron deficiency anemia    hx   Other malaise and fatigue    Radiation 06/05/14-07/18/14   left breast 50.4  gray, lumpectomy cavity boosted to 60.4 gray   SVT (supraventricular tachycardia) (HCC)    documented-takes metoprolol as needed   Vitamin D deficiency     Past Surgical History:  Procedure Laterality Date   CARPAL TUNNEL RELEASE     lt and rt   CHOLECYSTECTOMY  1999   COLONOSCOPY WITH PROPOFOL N/A 03/20/2015   Procedure: COLONOSCOPY WITH PROPOFOL;  Surgeon: Charolett Bumpers, MD;  Location: [facility];  Service: Endoscopy;  Laterality: N/A;   ELECTROPHYSIOLOGIC STUDY N/A 05/30/2015   Procedure: SVT Ablation;  Surgeon: Marinus Maw, MD;  Location: [facility];  Service: Cardiovascular;  Laterality: N/A;   PARTIAL MASTECTOMY WITH NEEDLE LOCALIZATION AND AXILLARY SENTINEL LYMPH NODE BX Left 01/16/2014   Procedure: PARTIAL MASTECTOMY WITH NEEDLE LOCALIZATION AND AXILLARY SENTINEL LYMPH NODE BIOPSY;  Surgeon: Ernestene Mention, MD;  Location: [facility];  Service: General;  Laterality: Left;   PORT-A-CATH REMOVAL Right 08/23/2014   Procedure: MINOR REMOVAL PORT-A-CATH;  Surgeon: Claud Kelp, MD;  Location: [facility];  Service: General;  Laterality: Right;   PORTACATH PLACEMENT Right 02/28/2014   Procedure: INSERTION PORT-A-CATH;  Surgeon: Angelia Mould  Dalbert Batman, MD;  Location: [facility];  Service: General;  Laterality: Right;     Current Outpatient Medications  Medication Sig Dispense Refill   acetaminophen-codeine (TYLENOL #3) 300-30 MG tablet Take 1 tablet by mouth every 8 (eight) hours as needed for moderate pain. 60 tablet 2   aspirin EC 81 MG tablet Take 81 mg by mouth daily.     celecoxib (CELEBREX) 200 MG capsule Take 1 capsule by mouth every 12 (twelve) hours.     Cholecalciferol (VITAMIN D) 2000 UNITS tablet Take 2,000 Units by mouth daily.     clonazePAM (KLONOPIN) 1 MG tablet Take 1 mg by mouth at bedtime.  1   doxycycline (VIBRAMYCIN) 100 MG capsule Take by mouth daily.  0   loratadine (CLARITIN) 10 MG tablet Take 10 mg by mouth every evening.       nitroGLYCERIN (NITROSTAT) 0.4 MG SL tablet Place 0.4 mg under the tongue every 5 (five) minutes as needed (Esophageal spasms).      pantoprazole (PROTONIX) 40 MG tablet Take 40 mg by mouth daily.     rosuvastatin (CRESTOR) 10 MG tablet Take 10 mg by mouth at bedtime.     sertraline (ZOLOFT) 100 MG tablet Take 50 mg by mouth every morning.     valACYclovir (VALTREX) 1000 MG tablet Take 1 g by mouth daily as needed (fever blisters).      No current facility-administered medications for this visit.    Allergies:   Erythromycin base    Social History:  The patient  reports that she quit smoking about 45 years ago. Her smoking use included cigarettes. She has a 12.00 pack-year smoking history. She has never used smokeless tobacco. She reports current alcohol use of about 7.0 standard drinks per week. She reports that she does not use drugs.   Family History:  The patient's family history includes Atrial fibrillation (age of onset: 52) in her mother; Cancer in her maternal aunt; Cancer (age of onset: 73) in her father; Hypercholesterolemia in her father; Hyperlipidemia in her mother; Hypertension in her father and mother.    ROS:  Please see the history of present illness.   Otherwise, review of systems are positive for DOE.   All other systems are reviewed and negative.    PHYSICAL EXAM: VS:  BP 110/72    Pulse 86    Ht 5' 2.5" (1.588 m)    Wt 158 lb (71.7 kg)    SpO2 96%    BMI 28.44 kg/m  , BMI Body mass index is 28.44 kg/m. GEN: Well nourished, well developed, in no acute distress HEENT: normal Neck: no JVD, carotid bruits, or masses Cardiac: RRR; 2/6 early systolic murmurs, rubs, or gallops,no edema  Respiratory:  clear to auscultation bilaterally, normal work of breathing GI: soft, nontender, nondistended, + BS MS: no deformity or atrophy Skin: warm and dry, no rash Neuro:  Strength and sensation are intact Psych: euthymic mood, full affect   EKG:   The ekg ordered today  demonstrates NSR, inf Q waves, PRWP   Recent Labs: 02/22/2021: ALT 22; BUN 10; Creatinine 0.77; Hemoglobin 15.0; Platelet Count 293; Potassium 3.6; Sodium 132   Lipid Panel No results found for: CHOL, TRIG, HDL, CHOLHDL, VLDL, LDLCALC, LDLDIRECT   Other studies Reviewed: Additional studies/ records that were reviewed today with results demonstrating: echo: "The left ventricle has hyperdynamic systolic function of >11%. The  cavity size was normal. There is mild concentric left ventricular  hypertrophy. Left ventricular diastolic Doppler parameters  are consistent  with impaired relaxation Elevated left  ventricular end-diastolic pressure.   2. The right ventricle has normal systolic function. The cavity was  normal. There is no increase in right ventricular wall thickness. Right  ventricular systolic pressure is normal with an estimated pressure of 22.5  mmHg.   3. The mitral valve is normal in structure.   4. The tricuspid valve is normal in structure.   5. The aortic valve is tricuspid.   6. The pulmonic valve was normal in structure. ".   ASSESSMENT AND PLAN:  SVT: No palpitations.  Status post ablation.  She has done well since that time. DOE: Plan for echo to eval murmur.  Mild, soft plaque noted on CTA about 3 years ago.  She is not really having angina.  There is likely some component of deconditioning as she has not been active since COVID started.  She does not feel like she has the motivation to go to exercise and is also been limited somewhat by arthritis.  We spoke about trying to gradually get back into some form of aerobic exercise that does not bother her joints.  She could certainly try a stationary bike or water aerobics.  Check BNP. Hyperlipidemia: She had some leg pains on simvastatin.  I agree with starting rosuvastatin given her family history of cardiac disease and the fact that she did have some plaque noted on her CT scan a few years ago.  Last LDL was 93 in  December 2022. Family h/o CAD: Both parents lived into their 34s.  Her brother has had CAD. Hyponatremia: This was noted by her primary care doctor.  She was told to water restrict but she had trouble with that.  She has been taking sodium tablets.  We will check be met today to see if this could be contributing to her symptoms.   Current medicines are reviewed at length with the patient today.  The patient concerns regarding her medicines were addressed.  The following changes have been made:  No change  Labs/ tests ordered today include:  No orders of the defined types were placed in this encounter.   Recommend 150 minutes/week of aerobic exercise Low fat, low carb, high fiber diet recommended  Disposition:   FU for echo   Signed, Larae Grooms, MD  12/12/2021 9:24 AM    [facility] [address], [place], [place]  [address] Phone: [phone]; Fax: [phone]

## 2021-12-12 ENCOUNTER — Ambulatory Visit: Payer: Medicare Other | Admitting: Interventional Cardiology

## 2021-12-12 ENCOUNTER — Encounter: Payer: Self-pay | Admitting: Interventional Cardiology

## 2021-12-12 ENCOUNTER — Other Ambulatory Visit: Payer: Self-pay

## 2021-12-12 VITALS — BP 110/72 | HR 86 | Ht 62.5 in | Wt 158.0 lb

## 2021-12-12 DIAGNOSIS — R0602 Shortness of breath: Secondary | ICD-10-CM | POA: Diagnosis not present

## 2021-12-12 DIAGNOSIS — R011 Cardiac murmur, unspecified: Secondary | ICD-10-CM

## 2021-12-12 DIAGNOSIS — E782 Mixed hyperlipidemia: Secondary | ICD-10-CM

## 2021-12-12 DIAGNOSIS — Z8249 Family history of ischemic heart disease and other diseases of the circulatory system: Secondary | ICD-10-CM

## 2021-12-12 DIAGNOSIS — E871 Hypo-osmolality and hyponatremia: Secondary | ICD-10-CM

## 2021-12-12 LAB — BASIC METABOLIC PANEL
BUN/Creatinine Ratio: 13 (ref 12–28)
BUN: 9 mg/dL (ref 8–27)
CO2: 28 mmol/L (ref 20–29)
Calcium: 9.8 mg/dL (ref 8.7–10.3)
Chloride: 87 mmol/L — ABNORMAL LOW (ref 96–106)
Creatinine, Ser: 0.67 mg/dL (ref 0.57–1.00)
Glucose: 114 mg/dL — ABNORMAL HIGH (ref 70–99)
Potassium: 4 mmol/L (ref 3.5–5.2)
Sodium: 128 mmol/L — ABNORMAL LOW (ref 134–144)
eGFR: 95 mL/min/{1.73_m2} (ref >59)

## 2021-12-12 LAB — PRO B NATRIURETIC PEPTIDE: NT-Pro BNP: 2587 pg/mL — ABNORMAL HIGH (ref 0–301)

## 2021-12-12 NOTE — Patient Instructions (Signed)
Medication Instructions:  Your physician recommends that you continue on your current medications as directed. Please refer to the Current Medication list given to you today.  *If you need a refill on your cardiac medications before your next appointment, please call your pharmacy*   Lab Work: Lab work to be done today--BMP and BNP If you have labs (blood work) drawn today and your tests are completely normal, you will receive your results only by: [place] (if you have MyChart) OR A paper copy in the mail If you have any lab test that is abnormal or we need to change your treatment, we will call you to review the results.   Testing/Procedures: Your physician has requested that you have an echocardiogram. Echocardiography is a painless test that uses sound waves to create images of your heart. It provides your doctor with information about the size and shape of your heart and how well your hearts chambers and valves are working. This procedure takes approximately one hour. There are no restrictions for this procedure.    Follow-Up: At [facility], you and your health needs are our priority.  As part of our continuing mission to provide you with exceptional heart care, we have created designated Provider Care Teams.  These Care Teams include your primary Cardiologist (physician) and Advanced Practice Providers (APPs -  Physician Assistants and Nurse Practitioners) who all work together to provide you with the care you need, when you need it.  We recommend signing up for the patient portal called "MyChart".  Sign up information is provided on this After Visit Summary.  MyChart is used to connect with patients for Virtual Visits (Telemedicine).  Patients are able to view lab/test results, encounter notes, upcoming appointments, etc.  Non-urgent messages can be sent to your provider as well.   To learn more about what you can do with MyChart, go to [email or web address].    Your  next appointment:   12 month(s)  The format for your next appointment:   In Person  Provider:   Larae Grooms, MD     Other Instructions

## 2021-12-13 ENCOUNTER — Telehealth: Payer: Self-pay | Admitting: Interventional Cardiology

## 2021-12-13 DIAGNOSIS — R0602 Shortness of breath: Secondary | ICD-10-CM

## 2021-12-13 MED ORDER — FUROSEMIDE 40 MG PO TABS: 40.0000 mg | ORAL_TABLET | Freq: Every day | ORAL | 3 refills | Status: DC

## 2021-12-13 NOTE — Telephone Encounter (Signed)
Sheri Booze, MD  Thompson Grayer, RN Cc: P Cv Div [facility] St Triage Sodium level low.  Fluid level increased based on BNP.  Would start Lasix 40 mg daily and recheck BMet next Wednesday.  The Lasix should get rid of more water than salt, which will help raise sodium level, and help breathing.  I would stop the sodium tablets as it looks like she is retaining water. Await echo as well.   If sodium stays low, we could consider referral to nephrology.      Called patient back with Dr. Hassell Done result note. While on the phone patient stated she also takes dyazide 37.5 -25 mg every other day and wants to know if she needs to continue this medication with taking the lasix. Updated patient's medication list because this medication was not listed.  Will forward to Dr. Irish Lack for further advisement.

## 2021-12-13 NOTE — Telephone Encounter (Signed)
Stop dyazide.  Stick with the Lasix.

## 2021-12-13 NOTE — Telephone Encounter (Signed)
Pt's medication was sent to pt's pharmacy as requested. Confirmation received.  °

## 2021-12-13 NOTE — Telephone Encounter (Signed)
°*  STAT* If patient is at the pharmacy, call can be transferred to refill team.   1. Which medications need to be refilled? (please list name of each medication and dose if known) furosemide (LASIX) 40 MG tablet  2. Which pharmacy/location (including street and city if local pharmacy) is medication to be sent to? [facility] [id number] - [place], [address]  3. Do they need a 30 day or 90 day supply?  90 day supply   Medication was sent to the wrong pharmacy.

## 2021-12-13 NOTE — Telephone Encounter (Signed)
° °  Pt is calling to get lab result °

## 2021-12-13 NOTE — Telephone Encounter (Signed)
Left message for patient to call back  

## 2021-12-13 NOTE — Telephone Encounter (Signed)
Patient aware of Dr. Hassell Done recommendations.

## 2021-12-16 ENCOUNTER — Telehealth: Payer: Self-pay | Admitting: Interventional Cardiology

## 2021-12-16 NOTE — Telephone Encounter (Signed)
I spoke with patient. She continues to have shortness of breath with exertion. Has not worsened but has not improved. Improves with rest.  She has stopped sodium tablets and she started furosemide this past Saturday.  No swelling in feet and ankles.  Has not noticed increased urine out put since starting furosemide. No weight gain or loss. I told patient I would make Dr Irish Lack aware.   Echo currently scheduled for 12/26/21.  Appointment moved to 12/18/21 at 8:20.  Patient is scheduled for lab work the same day.

## 2021-12-16 NOTE — Telephone Encounter (Signed)
°  Pt c/o Shortness Of Breath: STAT if SOB developed within the last 24 hours or pt is noticeably SOB on the phone  1. Are you currently SOB (can you hear that pt is SOB on the phone)? no  2. How long have you been experiencing SOB? Since before 12/12/21  3. Are you SOB when sitting or when up moving around? Up and moving around   4. Are you currently experiencing any other symptoms? No  Patient said she has not had any relief since she saw Dr. Irish Lack 12/12/21. She wanted to know what the next steps would be for her to do

## 2021-12-17 MED ORDER — FUROSEMIDE 40 MG PO TABS: 80.0000 mg | ORAL_TABLET | Freq: Every day | ORAL | 3 refills | Status: DC

## 2021-12-17 NOTE — Telephone Encounter (Signed)
Patient notified.  She will take 2 of the 40 mg furosemide tablets daily now.  New prescription to be sent in if patient to stay on this dose after echo and lab work done this week.

## 2021-12-17 NOTE — Telephone Encounter (Signed)
OK to increase Lasix to 80 mg daily.

## 2021-12-18 ENCOUNTER — Ambulatory Visit (HOSPITAL_COMMUNITY): Payer: Medicare Other | Attending: Interventional Cardiology

## 2021-12-18 ENCOUNTER — Other Ambulatory Visit: Payer: Medicare Other | Admitting: *Deleted

## 2021-12-18 ENCOUNTER — Other Ambulatory Visit: Payer: Self-pay

## 2021-12-18 DIAGNOSIS — R011 Cardiac murmur, unspecified: Secondary | ICD-10-CM | POA: Diagnosis not present

## 2021-12-18 DIAGNOSIS — R0602 Shortness of breath: Secondary | ICD-10-CM | POA: Diagnosis not present

## 2021-12-18 LAB — BASIC METABOLIC PANEL
BUN/Creatinine Ratio: 17 (ref 12–28)
BUN: 12 mg/dL (ref 8–27)
CO2: 30 mmol/L — ABNORMAL HIGH (ref 20–29)
Calcium: 9.9 mg/dL (ref 8.7–10.3)
Chloride: 82 mmol/L — ABNORMAL LOW (ref 96–106)
Creatinine, Ser: 0.71 mg/dL (ref 0.57–1.00)
Glucose: 127 mg/dL — ABNORMAL HIGH (ref 70–99)
Potassium: 3.2 mmol/L — ABNORMAL LOW (ref 3.5–5.2)
Sodium: 127 mmol/L — ABNORMAL LOW (ref 134–144)
eGFR: 93 mL/min/{1.73_m2} (ref >59)

## 2021-12-18 LAB — ECHOCARDIOGRAM COMPLETE
Area-P 1/2: 3.72 cm2
S' Lateral: 2.7 cm

## 2021-12-18 MED ORDER — PERFLUTREN LIPID MICROSPHERE
1.0000 mL | INTRAVENOUS | Status: AC | PRN
  Administered 2021-12-18: 1 mL via INTRAVENOUS

## 2021-12-19 ENCOUNTER — Telehealth: Payer: Self-pay

## 2021-12-19 ENCOUNTER — Telehealth: Payer: Self-pay | Admitting: *Deleted

## 2021-12-19 DIAGNOSIS — R0602 Shortness of breath: Secondary | ICD-10-CM

## 2021-12-19 DIAGNOSIS — E876 Hypokalemia: Secondary | ICD-10-CM

## 2021-12-19 MED ORDER — METOPROLOL TARTRATE 25 MG PO TABS: 25.0000 mg | ORAL_TABLET | Freq: Two times a day (BID) | ORAL | 3 refills | Status: DC

## 2021-12-19 MED ORDER — POTASSIUM CHLORIDE CRYS ER 20 MEQ PO TBCR: 40.0000 meq | EXTENDED_RELEASE_TABLET | Freq: Every day | ORAL | 3 refills | Status: DC

## 2021-12-19 NOTE — Telephone Encounter (Signed)
Left message to call back  DOD [place] Low Potassium 3.2 60 mEq for 2 days then 40 mEq daily  Bmet in one week

## 2021-12-19 NOTE — Telephone Encounter (Signed)
Called patient back with instructions. Patient wanted to know if she could have a Mg and a repeat BNP. Informed patient that a message would be sent to Dr. Irish Lack to see if these two additional lab work would be necessary. Patient was also wondering about her echo results. Informed patient that we would call her with the results once Dr. Irish Lack reviews.

## 2021-12-19 NOTE — Addendum Note (Signed)
Addended by: Aris Georgia, Adrieana Fennelly L on: 12/19/2021 03:24 PM   Modules accepted: Orders

## 2021-12-19 NOTE — Telephone Encounter (Signed)
Called patient to let her know Dr. Irish Lack is okay with adding Mg and BNP to labs. See echo for results.

## 2021-12-19 NOTE — Telephone Encounter (Signed)
Called patient with results of her echo and lab results. Will send metoprolol to her pharmacy of choice. Scheduled patient an appointment with Dr. Gasper Sells on 01/01/22.   Jettie Booze, MD  Thompson Grayer, RN Cc: P Cv Div Ch St Triage; Rudean Haskell A, MD Normal LV function.  THickening of the heart which can obstruct flow out of the heart and cause shortness of breath, particularly with exertion.  WOuld start metoprolol 25 mg BID and refer to Dr. Gasper Sells to see if she would be a candidate for any other therapies to reduce any potential obstruction.   Jettie Booze, MD  Thompson Grayer, RN Cc: P Cv Div Ch St Triage Agree with potassium recs from Dr. Johney Frame.  Sodium still low.  COntinue Lasix.  Would try to restrict  water intake.  Can have gatorade instead to help with sodium.

## 2021-12-19 NOTE — Telephone Encounter (Signed)
Follow Up:      Patient said she was talking to the nurse today and they were disconnected. They did not finish their conversation.

## 2021-12-20 ENCOUNTER — Encounter: Payer: Self-pay | Admitting: Interventional Cardiology

## 2021-12-20 ENCOUNTER — Telehealth: Payer: Self-pay | Admitting: Interventional Cardiology

## 2021-12-20 NOTE — Telephone Encounter (Signed)
New Message:    Patient said she received her Metoprolol for the first time. and it said 2 times a day. She thought it was 1 time a day, which is correct?     Pt c/o medication issue:  1. Name of Medication: Metoprolol  2. How are you currently taking this medication (dosage and times per day)? Need to know how she is supposed to take it  3. Are you having a reaction (difficulty breathing--STAT)?   4. What is your medication issue? No issues, need to know how to take it

## 2021-12-20 NOTE — Telephone Encounter (Signed)
Patient reports she does have reflux and is on Protonix. The lump feeling in her throat is new since Christmas.  Usually occurs at night.

## 2021-12-20 NOTE — Telephone Encounter (Signed)
I spoke with patient and told her medication was twice daily

## 2021-12-24 NOTE — Telephone Encounter (Signed)
Not sure what that feeling is.  I don't think it is cardiac related.  I would expect cardiac sx to be worse with exertion, rather than the pattern she is describing.   JV

## 2021-12-26 ENCOUNTER — Other Ambulatory Visit: Payer: Self-pay

## 2021-12-26 ENCOUNTER — Other Ambulatory Visit (HOSPITAL_COMMUNITY): Payer: Medicare Other

## 2021-12-26 ENCOUNTER — Other Ambulatory Visit: Payer: Medicare Other

## 2021-12-26 DIAGNOSIS — R0602 Shortness of breath: Secondary | ICD-10-CM | POA: Diagnosis not present

## 2021-12-26 DIAGNOSIS — E876 Hypokalemia: Secondary | ICD-10-CM

## 2021-12-27 ENCOUNTER — Telehealth: Payer: Self-pay | Admitting: *Deleted

## 2021-12-27 DIAGNOSIS — E871 Hypo-osmolality and hyponatremia: Secondary | ICD-10-CM

## 2021-12-27 DIAGNOSIS — E876 Hypokalemia: Secondary | ICD-10-CM

## 2021-12-27 LAB — BASIC METABOLIC PANEL
BUN/Creatinine Ratio: 18 (ref 12–28)
BUN: 15 mg/dL (ref 8–27)
CO2: 27 mmol/L (ref 20–29)
Calcium: 9.7 mg/dL (ref 8.7–10.3)
Chloride: 82 mmol/L — ABNORMAL LOW (ref 96–106)
Creatinine, Ser: 0.82 mg/dL (ref 0.57–1.00)
Glucose: 154 mg/dL — ABNORMAL HIGH (ref 70–99)
Potassium: 2.6 mmol/L — CL (ref 3.5–5.2)
Sodium: 128 mmol/L — ABNORMAL LOW (ref 134–144)
eGFR: 78 mL/min/{1.73_m2} (ref >59)

## 2021-12-27 LAB — MAGNESIUM: Magnesium: 2 mg/dL (ref 1.6–2.3)

## 2021-12-27 LAB — PRO B NATRIURETIC PEPTIDE: NT-Pro BNP: 402 pg/mL — ABNORMAL HIGH (ref 0–301)

## 2021-12-27 NOTE — Telephone Encounter (Signed)
Left message to call office

## 2021-12-27 NOTE — Telephone Encounter (Signed)
If sx persist in to next week,  we can repeat a CTA coronaries since it has been 3 years.     Would have her take 100 mg metoprolol

## 2021-12-27 NOTE — Telephone Encounter (Signed)
Patient notified.  She will come in for lab work on 1/27. Patient reports breathing is better. She is having a lot of problems with indigestion. Started around Christmas and occurs mainly at night.  States she does have a hiatal hernia

## 2021-12-27 NOTE — Telephone Encounter (Signed)
Left message to call office.  Labs routed to Dr Inda Merlin

## 2021-12-27 NOTE — Telephone Encounter (Signed)
-----   Message from Jettie Booze, MD sent at 12/27/2021  8:21 AM EST ----- Increase potassium to 40 mEq BID x 3 days, then back to 40 mEq daily.  Decrease Lasix to 40 mg daily.  Sodium remains low.  Please cc Dr. Inda Merlin as well.  Ultimately, may need a referral to nephrology.   BMet in 1 week.    Is breathing any better?  Melissa, Does spironolactone cause hyponatremia?  May need to swithc diuretics to help with low potassium.

## 2021-12-30 ENCOUNTER — Telehealth: Payer: Self-pay | Admitting: *Deleted

## 2021-12-30 ENCOUNTER — Encounter: Payer: Self-pay | Admitting: Interventional Cardiology

## 2021-12-30 DIAGNOSIS — R0602 Shortness of breath: Secondary | ICD-10-CM

## 2021-12-30 DIAGNOSIS — Z01812 Encounter for preprocedural laboratory examination: Secondary | ICD-10-CM

## 2021-12-30 NOTE — Telephone Encounter (Signed)
I spoke with Ubaldo Glassing at Dr Inda Merlin' office and gave her information from Dr Irish Lack.  She will give information to Dr Inda Merlin

## 2021-12-30 NOTE — Telephone Encounter (Signed)
Sheri Booze, MD  You 6 minutes ago (11:52 AM)   OK to repeat coronary CTA.  Would base her metoprolol dose on her most recent heart rate at hom, since we recently started metoprolol

## 2021-12-30 NOTE — Telephone Encounter (Signed)
Jettie Booze, MD  12/30/2021  9:36 AM EST Back to Top    Since SSRIs like Zoloft can cause low sodium, would check with PMD if there are other treatments without that side effect.  Not sure if Dr. Inda Merlin will get this message since they are not on Epic

## 2021-12-30 NOTE — Progress Notes (Signed)
Cardiology Office Note:    Date:  01/01/2022   ID:  Sheri Arellano, DOB 29-Nov-1951, MRN [id number]  PCP:  Josetta Huddle, MD   [facility] Providers Cardiologist:  Larae Grooms, MD     Referring MD: Josetta Huddle, MD   CC:  HOCM  History of Present Illness:    Sheri Arellano is a 71 y.o. female with a hx of SVT ablation (AVNRT- Lovena Le 2016), hx of DCIS with taxane based chemptherapy and 60 Gy of chest radiation,  with SOB found to have HOCM Seen 01/01/22.  Patient notes that she had good days and bad days.  On the bad days she notes chest heaviness with minimal exertion.  Worse symptom is fatigue.  This has been going on since at least the summer: she has to stop frequently gardening because of this.  She notes that she thinks all of her symptoms started after chemo-radiation, but notes that thinks have escalated.  She notes that on Christmas, she had dizziness, and feeling winded greater than what she has felt before.   Never had symptoms on hot days, after large meals.  Patient is always trying to stay hydrated. She notes that because of new hyponatremia.  She notes no changes in symptoms on the sodium pills she was on. She notes no changes on the lasix medication. She notes no change on the metoprolol she had started.    Past Medical History:  Diagnosis Date   Anxiety    probable panic attacks   Cancer (Ramirez-Perez)    breast   Dysphagia    Esophageal dysmotility    GERD (gastroesophageal reflux disease)    H/O hiatal hernia    Hyperlipidemia    Insomnia    Iron deficiency anemia    hx   Other malaise and fatigue    Radiation 06/05/14-07/18/14   left breast 50.4 gray, lumpectomy cavity boosted to 60.4 gray   SVT (supraventricular tachycardia) (HCC)    documented-takes metoprolol as needed   Vitamin D deficiency     Past Surgical History:  Procedure Laterality Date   CARPAL TUNNEL RELEASE     lt and rt   CHOLECYSTECTOMY  1999   COLONOSCOPY WITH PROPOFOL N/A  03/20/2015   Procedure: COLONOSCOPY WITH PROPOFOL;  Surgeon: Garlan Fair, MD;  Location: [facility];  Service: Endoscopy;  Laterality: N/A;   ELECTROPHYSIOLOGIC STUDY N/A 05/30/2015   Procedure: SVT Ablation;  Surgeon: Evans Lance, MD;  Location: [facility];  Service: Cardiovascular;  Laterality: N/A;   PARTIAL MASTECTOMY WITH NEEDLE LOCALIZATION AND AXILLARY SENTINEL LYMPH NODE BX Left 01/16/2014   Procedure: PARTIAL MASTECTOMY WITH NEEDLE LOCALIZATION AND AXILLARY SENTINEL LYMPH NODE BIOPSY;  Surgeon: [name], MD;  Location: [facility];  Service: General;  Laterality: Left;   PORT-A-CATH REMOVAL Right 08/23/2014   Procedure: MINOR REMOVAL PORT-A-CATH;  Surgeon: Fanny Skates, MD;  Location: [facility];  Service: General;  Laterality: Right;   PORTACATH PLACEMENT Right 02/28/2014   Procedure: INSERTION PORT-A-CATH;  Surgeon: [name], MD;  Location: [facility];  Service: General;  Laterality: Right;    Current Medications: Current Meds  Medication Sig   acetaminophen-codeine (TYLENOL #3) 300-30 MG tablet Take 1 tablet by mouth every 8 (eight) hours as needed for moderate pain.   aspirin EC 81 MG tablet Take 81 mg by mouth daily.   celecoxib (CELEBREX) 200 MG capsule Take 1 capsule by mouth every 12 (twelve) hours.  Cholecalciferol (VITAMIN D) 2000 UNITS tablet Take 2,000 Units by mouth daily.   clonazePAM (KLONOPIN) 1 MG tablet Take 1 mg by mouth at bedtime.   doxycycline (VIBRAMYCIN) 100 MG capsule Take by mouth daily.   furosemide (LASIX) 40 MG tablet Take 2 tablets (80 mg total) by mouth daily. (Patient taking differently: Take 40 mg by mouth daily.)   loratadine (CLARITIN) 10 MG tablet Take 10 mg by mouth every evening.    metoprolol succinate (TOPROL-XL) 50 MG 24 hr tablet Take 1.5 tablets (75 mg total) by mouth daily. Take with or immediately following a meal.   pantoprazole (PROTONIX) 40 MG tablet Take 40 mg by mouth daily.    potassium chloride (KLOR-CON) 20 MEQ packet Take 20 mEq by mouth 2 (two) times daily.   rosuvastatin (CRESTOR) 10 MG tablet Take 10 mg by mouth at bedtime.   sertraline (ZOLOFT) 100 MG tablet Take 50 mg by mouth every morning.   valACYclovir (VALTREX) 1000 MG tablet Take 1 g by mouth daily as needed (fever blisters).    [DISCONTINUED] metoprolol tartrate (LOPRESSOR) 25 MG tablet Take 1 tablet (25 mg total) by mouth 2 (two) times daily.   [DISCONTINUED] nitroGLYCERIN (NITROSTAT) 0.4 MG SL tablet Place 0.4 mg under the tongue every 5 (five) minutes as needed (Esophageal spasms).      Allergies:   Erythromycin base, Macrolides and ketolides, and Valsartan-hydrochlorothiazide   Social History   Socioeconomic History   Marital status: Married    Spouse name: Not on file   Number of children: 2   Years of education: Not on file   Highest education level: Not on file  Occupational History   Occupation: realtor    Employer: [employer]  Tobacco Use   Smoking status: Former    Packs/day: 1.50    Years: 8.00    Pack years: 12.00    Types: Cigarettes    Quit date: 01/22/1976    Years since quitting: 45.9   Smokeless tobacco: Never  Vaping Use   Vaping Use: Never used  Substance and Sexual Activity   Alcohol use: Yes    Alcohol/week: 7.0 standard drinks    Types: 7 Shots of liquor per week    Comment: 1 vodka per day   Drug use: No   Sexual activity: Yes    Birth control/protection: Post-menopausal  Other Topics Concern   Not on file  Social History Narrative   Not on file   Social Determinants of Health   Financial Resource Strain: Not on file  Food Insecurity: Not on file  Transportation Needs: Not on file  Physical Activity: Not on file  Stress: Not on file  Social Connections: Not on file    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 There is no history of Colon cancer or CAD. History of coronary artery disease notable for CAD female. History of heart failure notable for CHF mother. History of arrhythmia notable for atrial fibrillation mother. Denies family history of sudden cardiac death including drowning, car accidents, or unexplained deaths in the family. No history of cardiomyopathies including hypertrophic cardiomyopathy, left ventricular non-compaction, or arrhythmogenic right ventricular cardiomyopathy.   ROS:   Please see the history of present illness.  All other systems reviewed and are negative.  EKGs/Labs/Other Studies Reviewed:    The following studies were reviewed today:  EKG:  EKG is  ordered today.  The ekg ordered today demonstrates  12/12/21: SR rate 84 with LVH  Transthoracic Echocardiogram: Date:12/18/21 Results:  1. Left ventricular ejection fraction, by estimation, is 60 to 65%. The  left ventricle has normal function. The left ventricle has no regional  wall motion abnormalities. Moderate asymmetric septal left ventricular  hypertrophy. Mitral valve systolic  anterior motion noted. There was no significant LV outflow gradient  measured at rest but with valsalva there appeared to be a LV outflow  gradient up to 94 mmHg peak. Left ventricular diastolic parameters are  consistent with Grade I diastolic dysfunction  (impaired relaxation).   2. Right ventricular systolic function is normal. The right ventricular  size is normal. Tricuspid regurgitation signal is inadequate for assessing  PA pressure.   3. The aortic valve is tricuspid. Aortic valve regurgitation is not  visualized. Aortic valve sclerosis/calcification is present, without any  evidence of aortic stenosis.   4. The mitral valve is abnormal, there was mitral valve SAM  noted as  above. Trivial mitral valve regurgitation. No evidence of mitral stenosis.   5. The inferior vena cava is normal in size with greater than 50%  respiratory variability, suggesting right atrial pressure of 3 mmHg.   6. Possible hypertrophic cardiomyopathy with moderate asymmetric septal  hypertrophy, mitral valve SAM, and LVOT gradient that appears to be up to  94 mmHg peak with valsalva though no significant gradient measured at  rest.    Recent Labs: 02/22/2021: ALT 22; Hemoglobin 15.0; Platelet Count 293 12/26/2021: BUN 15; Creatinine, Ser 0.82; Magnesium 2.0; NT-Pro BNP 402; Potassium 2.6; Sodium 128  Recent Lipid Panel No results found for: CHOL, TRIG, HDL, CHOLHDL, VLDL, LDLCALC, LDLDIRECT   Physical Exam:    VS:  BP 117/78    Pulse 70    Ht 5' 2.5" (1.588 m)    Wt 71.7 kg    SpO2 96%    BMI 28.44 kg/m     Wt Readings from Last 3 Encounters:  01/01/22 71.7 kg  12/12/21 71.7 kg  02/22/21 70.1 kg     Gen: no distress, obese/well nourished/malnourished   Neck: No JVD,  Cardiac: No Rubs or Gallops, harsh systolic murmur worse with handgrip and standing , regular rhythm, +2 radial pulses Respiratory: Clear to auscultation bilaterally, normal effort, normal  respiratory rate GI: Soft, nontender, non-distended  MS: No  edema;  moves all extremities Integument: Skin feels warm Neuro:  At time of evaluation, alert and oriented to person/place/time/situation  Psych: Normal affect, patient feels a bit anxious   ASSESSMENT:    1. HOCM (hypertrophic obstructive cardiomyopathy) (HCC)    PLAN:    Hypertrophic Cardiomyopathy -  peak gradient 90 on Valsalva (< 50 mm Hg) - with associated MR and LVOT (inducible - Family history not clear, discussed 1st degree family history screening (post MRI, we will discuss genetic testing vs serial echo for her two sons if we are able to confirm diagnosis) - NYHA III - Exercise testing  has not been done, we will order a stress echo  on her optimized medications for inducible gradient - CMR will be re-ordered - absence of atrial fibrillation -  2 year assessment for VT on rhythm monitor will be done at next visit - increasing metoprolol to 75 mg PO daily - ICD/Discussions and SCD risk is pending  full evaluation - we have briefly discussed myosin inhibition medication  I suspect that some of her symptoms are related to her inducible gradient I suspect that with higher heart rate she will be able to get a cardiac CT, if still planned by primary cardiology team (does have risk factors in the setting high radiation exposure)  Time Spent Directly with Patient:   I have spent a total of 40 minutes with the patient reviewing notes, imaging, EKGs, labs and examining the patient as well as establishing an assessment and plan that was discussed personally with the patient.  > 50% of time was spent in direct patient care.      Shared Decision Making/Informed Consent The risks [chest pain, shortness of breath, cardiac arrhythmias, dizziness, blood pressure fluctuations, myocardial infarction, stroke/transient ischemic attack, and life-threatening complications (estimated to be 1 in 10,000)], benefits (risk stratification, diagnosing coronary artery disease, treatment guidance) and alternatives of a stress or dobutamine stress echocardiogram were discussed in detail with Sheri Arellano and she agrees to proceed.    Medication Adjustments/Labs and Tests Ordered: Current medicines are reviewed at length with the patient today.  Concerns regarding medicines are outlined above.  Orders Placed This Encounter  Procedures   MR CARDIAC MORPHOLOGY W WO CONTRAST   Basic metabolic panel   CBC   Basic metabolic panel   CBC   ECHOCARDIOGRAM STRESS TEST   Meds ordered this encounter  Medications   nitroGLYCERIN (NITROSTAT) 0.4 MG SL tablet    Sig: Place 1 tablet (0.4 mg total) under the tongue every 5 (five) minutes as needed (Esophageal  spasms).    Dispense:  25 tablet    Refill:  3   metoprolol succinate (TOPROL-XL) 50 MG 24 hr tablet    Sig: Take 1.5 tablets (75 mg total) by mouth daily. Take with or immediately following a meal.    Dispense:  135 tablet    Refill:  3    Patient Instructions  Medication Instructions:  Your physician has recommended you make the following change in your medication:  STOP: metoprolol tartrate START: metoprolol succinate 75 mg (1 and a half tablets) by mouth once daily   *If you need a refill on your cardiac medications before your next appointment, please call your pharmacy*   Lab Work: TODAY: BMET and CBC If you have labs (blood work) drawn today and your tests are completely normal, you will receive your results only by: [facility] (if you have MyChart) OR A paper copy in the mail If you have any lab test that is abnormal or we need to change your treatment, we will call you to review the results.   Testing/Procedures: Your physician has requested that you have a cardiac MRI. Cardiac MRI uses a computer to create images of your heart as its beating, producing both still and moving pictures of your heart and major blood vessels. For further information please visit [email or web address] Please follow the instruction sheet given to you today for more information.   Your physician has requested that you have a stress echocardiogram. For further information please visit [email or web address]. Please follow instruction sheet as given.    Follow-Up: At [facility], you and your health needs are our priority.  As part of our continuing mission to provide you with exceptional heart care, we have created designated Provider Care Teams.  These Care Teams include your primary Cardiologist (physician) and Advanced Practice Providers (APPs -  Physician Assistants and Nurse Practitioners) who all work together to  provide you with the care you need, when you need it.    Your next  appointment:   2 - 73month(s)  The format for your next appointment:   In Person  Provider:   Rudean Haskell, MD      Other Instructions    You are scheduled for Cardiac MRI on _to be determined____. Please arrive at the [facility] main entrance of [facility] at ___to be determined___ (30-45 minutes prior to test start time). ?  [facility] [address] [place], [place] [address] [phone]  Please take advantage of the free valet parking available at the MAIN entrance (A entrance). Proceed to the [facility] Radiology Department (First Floor). ? Magnetic resonance imaging (MRI) is a painless test that produces images of the inside of the body without using Xrays.  During an MRI, strong magnets and radio waves work together in a [employer] officer, political party to form detailed images.   MRI images may provide more details about a medical condition than X-rays, CT scans, and ultrasounds can provide.  You may be given earphones to listen for instructions.  You may eat a light breakfast and take medications as ordered with the exception of Furosemide. Please avoid stimulants for 12 hr prior to test. (Ie. Caffeine, nicotine, chocolate, or antihistamine medications)  If a contrast material will be used, an IV will be inserted into one of your veins. Contrast material will be injected into your IV. It will leave your body through your urine within a day. You may be told to drink plenty of fluids to help flush the contrast material out of your system.  You will be asked to remove all metal, including: Watch, jewelry, and other metal objects including hearing aids, hair pieces and dentures. Also wearable glucose monitoring systems (ie. Freestyle Libre and Omnipods) (Braces and fillings normally are not a problem.)   TEST WILL TAKE APPROXIMATELY 1 HOUR  PLEASE NOTIFY SCHEDULING AT LEAST 24 HOURS IN ADVANCE IF YOU ARE UNABLE TO KEEP YOUR  APPOINTMENT. [phone]  Please call Marchia Bond, cardiac imaging nurse navigator with any questions/concerns. Marchia Bond RN Navigator Cardiac Imaging Gordy Clement RN Navigator Cardiac Imaging [facility] [phone] Office      Signed, Werner Lean, MD  01/01/2022 3:43 PM    [place]

## 2021-12-30 NOTE — H&P (View-Only) (Signed)
Cardiology Office Note:    Date:  01/01/2022   ID:  Sheri Arellano, DOB 1951/07/26, MRN [id number]  PCP:  Josetta Huddle, MD   [facility] Providers Cardiologist:  Larae Grooms, MD     Referring MD: Josetta Huddle, MD   CC:  HOCM  History of Present Illness:    Sheri Arellano is a 71 y.o. female with a hx of SVT ablation (AVNRT- Lovena Le 2016), hx of DCIS with taxane based chemptherapy and 60 Gy of chest radiation,  with SOB found to have HOCM Seen 01/01/22.  Patient notes that she had good days and bad days.  On the bad days she notes chest heaviness with minimal exertion.  Worse symptom is fatigue.  This has been going on since at least the summer: she has to stop frequently gardening because of this.  She notes that she thinks all of her symptoms started after chemo-radiation, but notes that thinks have escalated.  She notes that on Christmas, she had dizziness, and feeling winded greater than what she has felt before.   Never had symptoms on hot days, after large meals.  Patient is always trying to stay hydrated. She notes that because of new hyponatremia.  She notes no changes in symptoms on the sodium pills she was on. She notes no changes on the lasix medication. She notes no change on the metoprolol she had started.    Past Medical History:  Diagnosis Date   Anxiety    probable panic attacks   Cancer ([place])    breast   Dysphagia    Esophageal dysmotility    GERD (gastroesophageal reflux disease)    H/O hiatal hernia    Hyperlipidemia    Insomnia    Iron deficiency anemia    hx   Other malaise and fatigue    Radiation 06/05/14-07/18/14   left breast 50.4 gray, lumpectomy cavity boosted to 60.4 gray   SVT (supraventricular tachycardia) (HCC)    documented-takes metoprolol as needed   Vitamin D deficiency     Past Surgical History:  Procedure Laterality Date   CARPAL TUNNEL RELEASE     lt and rt   CHOLECYSTECTOMY  1999   COLONOSCOPY WITH PROPOFOL N/A  03/20/2015   Procedure: COLONOSCOPY WITH PROPOFOL;  Surgeon: Garlan Fair, MD;  Location: [facility];  Service: Endoscopy;  Laterality: N/A;   ELECTROPHYSIOLOGIC STUDY N/A 05/30/2015   Procedure: SVT Ablation;  Surgeon: Evans Lance, MD;  Location: [place] [facility];  Service: Cardiovascular;  Laterality: N/A;   PARTIAL MASTECTOMY WITH NEEDLE LOCALIZATION AND AXILLARY SENTINEL LYMPH NODE BX Left 01/16/2014   Procedure: PARTIAL MASTECTOMY WITH NEEDLE LOCALIZATION AND AXILLARY SENTINEL LYMPH NODE BIOPSY;  Surgeon: [name], MD;  Location: [place];  Service: General;  Laterality: Left;   PORT-A-CATH REMOVAL Right 08/23/2014   Procedure: MINOR REMOVAL PORT-A-CATH;  Surgeon: Fanny Skates, MD;  Location: [place];  Service: General;  Laterality: Right;   PORTACATH PLACEMENT Right 02/28/2014   Procedure: INSERTION PORT-A-CATH;  Surgeon: [name], MD;  Location: [place];  Service: General;  Laterality: Right;    Current Medications: Current Meds  Medication Sig   acetaminophen-codeine (TYLENOL #3) 300-30 MG tablet Take 1 tablet by mouth every 8 (eight) hours as needed for moderate pain.   aspirin EC 81 MG tablet Take 81 mg by mouth daily.   celecoxib (CELEBREX) 200 MG capsule Take 1 capsule by mouth every 12 (twelve) hours.  Cholecalciferol (VITAMIN D) 2000 UNITS tablet Take 2,000 Units by mouth daily.   clonazePAM (KLONOPIN) 1 MG tablet Take 1 mg by mouth at bedtime.   doxycycline (VIBRAMYCIN) 100 MG capsule Take by mouth daily.   furosemide (LASIX) 40 MG tablet Take 2 tablets (80 mg total) by mouth daily. (Patient taking differently: Take 40 mg by mouth daily.)   loratadine (CLARITIN) 10 MG tablet Take 10 mg by mouth every evening.    metoprolol succinate (TOPROL-XL) 50 MG 24 hr tablet Take 1.5 tablets (75 mg total) by mouth daily. Take with or immediately following a meal.   pantoprazole (PROTONIX) 40 MG tablet Take 40 mg by mouth daily.    potassium chloride (KLOR-CON) 20 MEQ packet Take 20 mEq by mouth 2 (two) times daily.   rosuvastatin (CRESTOR) 10 MG tablet Take 10 mg by mouth at bedtime.   sertraline (ZOLOFT) 100 MG tablet Take 50 mg by mouth every morning.   valACYclovir (VALTREX) 1000 MG tablet Take 1 g by mouth daily as needed (fever blisters).    [DISCONTINUED] metoprolol tartrate (LOPRESSOR) 25 MG tablet Take 1 tablet (25 mg total) by mouth 2 (two) times daily.   [DISCONTINUED] nitroGLYCERIN (NITROSTAT) 0.4 MG SL tablet Place 0.4 mg under the tongue every 5 (five) minutes as needed (Esophageal spasms).      Allergies:   Erythromycin base, Macrolides and ketolides, and Valsartan-hydrochlorothiazide   Social History   Socioeconomic History   Marital status: Married    Spouse name: Not on file   Number of children: 2   Years of education: Not on file   Highest education level: Not on file  Occupational History   Occupation: realtor    Employer: [employer]  Tobacco Use   Smoking status: Former    Packs/day: 1.50    Years: 8.00    Pack years: 12.00    Types: Cigarettes    Quit date: 01/22/1976    Years since quitting: 45.9   Smokeless tobacco: Never  Vaping Use   Vaping Use: Never used  Substance and Sexual Activity   Alcohol use: Yes    Alcohol/week: 7.0 standard drinks    Types: 7 Shots of liquor per week    Comment: 1 vodka per day   Drug use: No   Sexual activity: Yes    Birth control/protection: Post-menopausal  Other Topics Concern   Not on file  Social History Narrative   Not on file   Social Determinants of Health   Financial Resource Strain: Not on file  Food Insecurity: Not on file  Transportation Needs: Not on file  Physical Activity: Not on file  Stress: Not on file  Social Connections: Not on file    Social: 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 There is no history of Colon cancer or CAD. History of coronary artery disease notable for CAD female. History of heart failure notable for CHF mother. History of arrhythmia notable for atrial fibrillation mother. Denies family history of sudden cardiac death including drowning, car accidents, or unexplained deaths in the family. No history of cardiomyopathies including hypertrophic cardiomyopathy, left ventricular non-compaction, or arrhythmogenic right ventricular cardiomyopathy.   ROS:   Please see the history of present illness.  All other systems reviewed and are negative.  EKGs/Labs/Other Studies Reviewed:    The following studies were reviewed today:  EKG:  EKG is  ordered today.  The ekg ordered today demonstrates  12/12/21: SR rate 84 with LVH  Transthoracic Echocardiogram: Date:12/18/21 Results:  1. Left ventricular ejection fraction, by estimation, is 60 to 65%. The  left ventricle has normal function. The left ventricle has no regional  wall motion abnormalities. Moderate asymmetric septal left ventricular  hypertrophy. Mitral valve systolic  anterior motion noted. There was no significant LV outflow gradient  measured at rest but with valsalva there appeared to be a LV outflow  gradient up to 94 mmHg peak. Left ventricular diastolic parameters are  consistent with Grade I diastolic dysfunction  (impaired relaxation).   2. Right ventricular systolic function is normal. The right ventricular  size is normal. Tricuspid regurgitation signal is inadequate for assessing  PA pressure.   3. The aortic valve is tricuspid. Aortic valve regurgitation is not  visualized. Aortic valve sclerosis/calcification is present, without any  evidence of aortic stenosis.   4. The mitral valve is abnormal, there was mitral valve SAM  noted as  above. Trivial mitral valve regurgitation. No evidence of mitral stenosis.   5. The inferior vena cava is normal in size with greater than 50%  respiratory variability, suggesting right atrial pressure of 3 mmHg.   6. Possible hypertrophic cardiomyopathy with moderate asymmetric septal  hypertrophy, mitral valve SAM, and LVOT gradient that appears to be up to  94 mmHg peak with valsalva though no significant gradient measured at  rest.    Recent Labs: 02/22/2021: ALT 22; Hemoglobin 15.0; Platelet Count 293 12/26/2021: BUN 15; Creatinine, Ser 0.82; Magnesium 2.0; NT-Pro BNP 402; Potassium 2.6; Sodium 128  Recent Lipid Panel No results found for: CHOL, TRIG, HDL, CHOLHDL, VLDL, LDLCALC, LDLDIRECT   Physical Exam:    VS:  BP 117/78    Pulse 70    Ht 5' 2.5" (1.588 m)    Wt 71.7 kg    SpO2 96%    BMI 28.44 kg/m     Wt Readings from Last 3 Encounters:  01/01/22 71.7 kg  12/12/21 71.7 kg  02/22/21 70.1 kg     Gen: no distress, obese/well nourished/malnourished   Neck: No JVD,  Cardiac: No Rubs or Gallops, harsh systolic murmur worse with handgrip and standing , regular rhythm, +2 radial pulses Respiratory: Clear to auscultation bilaterally, normal effort, normal  respiratory rate GI: Soft, nontender, non-distended  MS: No  edema;  moves all extremities Integument: Skin feels warm Neuro:  At time of evaluation, alert and oriented to person/place/time/situation  Psych: Normal affect, patient feels a bit anxious   ASSESSMENT:    1. HOCM (hypertrophic obstructive cardiomyopathy) (HCC)    PLAN:    Hypertrophic Cardiomyopathy -  peak gradient 90 on Valsalva (< 50 mm Hg) - with associated MR and LVOT (inducible - Family history not clear, discussed 1st degree family history screening (post MRI, we will discuss genetic testing vs serial echo for her two sons if we are able to confirm diagnosis) - NYHA III - Exercise testing  has not been done, we will order a stress echo  on her optimized medications for inducible gradient - CMR will be re-ordered - absence of atrial fibrillation -  2 year assessment for VT on rhythm monitor will be done at next visit - increasing metoprolol to 75 mg PO daily - ICD/Discussions and SCD risk is pending  full evaluation - we have briefly discussed myosin inhibition medication  I suspect that some of her symptoms are related to her inducible gradient I suspect that with higher heart rate she will be able to get a cardiac CT, if still planned by primary cardiology team (does have risk factors in the setting high radiation exposure)  Time Spent Directly with Patient:   I have spent a total of 40 minutes with the patient reviewing notes, imaging, EKGs, labs and examining the patient as well as establishing an assessment and plan that was discussed personally with the patient.  > 50% of time was spent in direct patient care.      Shared Decision Making/Informed Consent The risks [chest pain, shortness of breath, cardiac arrhythmias, dizziness, blood pressure fluctuations, myocardial infarction, stroke/transient ischemic attack, and life-threatening complications (estimated to be 1 in 10,000)], benefits (risk stratification, diagnosing coronary artery disease, treatment guidance) and alternatives of a stress or dobutamine stress echocardiogram were discussed in detail with Ms. Leske and she agrees to proceed.    Medication Adjustments/Labs and Tests Ordered: Current medicines are reviewed at length with the patient today.  Concerns regarding medicines are outlined above.  Orders Placed This Encounter  Procedures   MR CARDIAC MORPHOLOGY W WO CONTRAST   Basic metabolic panel   CBC   Basic metabolic panel   CBC   ECHOCARDIOGRAM STRESS TEST   Meds ordered this encounter  Medications   nitroGLYCERIN (NITROSTAT) 0.4 MG SL tablet    Sig: Place 1 tablet (0.4 mg total) under the tongue every 5 (five) minutes as needed (Esophageal  spasms).    Dispense:  25 tablet    Refill:  3   metoprolol succinate (TOPROL-XL) 50 MG 24 hr tablet    Sig: Take 1.5 tablets (75 mg total) by mouth daily. Take with or immediately following a meal.    Dispense:  135 tablet    Refill:  3    Patient Instructions  Medication Instructions:  Your physician has recommended you make the following change in your medication:  STOP: metoprolol tartrate START: metoprolol succinate 75 mg (1 and a half tablets) by mouth once daily   *If you need a refill on your cardiac medications before your next appointment, please call your pharmacy*   Lab Work: TODAY: BMET and CBC If you have labs (blood work) drawn today and your tests are completely normal, you will receive your results only by: [place] (if you have MyChart) OR A paper copy in the mail If you have any lab test that is abnormal or we need to change your treatment, we will call you to review the results.   Testing/Procedures: Your physician has requested that you have a cardiac MRI. Cardiac MRI uses a computer to create images of your heart as its beating, producing both still and moving pictures of your heart and major blood vessels. For further information please visit [email or web address] Please follow the instruction sheet given to you today for more information.   Your physician has requested that you have a stress echocardiogram. For further information please visit [email or web address]. Please follow instruction sheet as given.    Follow-Up: At [facility], you and your health needs are our priority.  As part of our continuing mission to provide you with exceptional heart care, we have created designated Provider Care Teams.  These Care Teams include your primary Cardiologist (physician) and Advanced Practice Providers (APPs -  Physician Assistants and Nurse Practitioners) who all work together to  provide you with the care you need, when you need it.    Your next  appointment:   2 - 74month(s)  The format for your next appointment:   In Person  Provider:   Rudean Haskell, MD      Other Instructions    You are scheduled for Cardiac MRI on _to be determined____. Please arrive at the [facility] main entrance of [facility] at ___to be determined___ (30-45 minutes prior to test start time). ?  [facility] [address] [place], [place] [address] [phone]  Please take advantage of the free valet parking available at the MAIN entrance (A entrance). Proceed to the [facility] Radiology Department (First Floor). ? Magnetic resonance imaging (MRI) is a painless test that produces images of the inside of the body without using Xrays.  During an MRI, strong magnets and radio waves work together in a Research officer, political party to form detailed images.   MRI images may provide more details about a medical condition than X-rays, CT scans, and ultrasounds can provide.  You may be given earphones to listen for instructions.  You may eat a light breakfast and take medications as ordered with the exception of Furosemide. Please avoid stimulants for 12 hr prior to test. (Ie. Caffeine, nicotine, chocolate, or antihistamine medications)  If a contrast material will be used, an IV will be inserted into one of your veins. Contrast material will be injected into your IV. It will leave your body through your urine within a day. You may be told to drink plenty of fluids to help flush the contrast material out of your system.  You will be asked to remove all metal, including: Watch, jewelry, and other metal objects including hearing aids, hair pieces and dentures. Also wearable glucose monitoring systems (ie. Freestyle Libre and Omnipods) (Braces and fillings normally are not a problem.)   TEST WILL TAKE APPROXIMATELY 1 HOUR  PLEASE NOTIFY SCHEDULING AT LEAST 24 HOURS IN ADVANCE IF YOU ARE UNABLE TO KEEP YOUR  APPOINTMENT. [phone]  Please call Marchia Bond, cardiac imaging nurse navigator with any questions/concerns. Marchia Bond RN Navigator Cardiac Imaging Gordy Clement RN Navigator Cardiac Imaging [facility] Heart and Vascular Services [phone] Office      Signed, Werner Lean, MD  01/01/2022 3:43 PM    [place]

## 2022-01-01 ENCOUNTER — Ambulatory Visit: Payer: Medicare Other | Admitting: Internal Medicine

## 2022-01-01 ENCOUNTER — Encounter: Payer: Self-pay | Admitting: Internal Medicine

## 2022-01-01 ENCOUNTER — Other Ambulatory Visit: Payer: Self-pay

## 2022-01-01 VITALS — BP 117/78 | HR 70 | Ht 62.5 in | Wt 158.0 lb

## 2022-01-01 DIAGNOSIS — I421 Obstructive hypertrophic cardiomyopathy: Secondary | ICD-10-CM | POA: Diagnosis not present

## 2022-01-01 MED ORDER — METOPROLOL SUCCINATE ER 50 MG PO TB24: 75.0000 mg | ORAL_TABLET | Freq: Every day | ORAL | 3 refills | Status: DC

## 2022-01-01 MED ORDER — NITROGLYCERIN 0.4 MG SL SUBL: 0.4000 mg | SUBLINGUAL_TABLET | SUBLINGUAL | 3 refills | Status: AC | PRN

## 2022-01-01 NOTE — Patient Instructions (Signed)
Medication Instructions:  Your physician has recommended you make the following change in your medication:  STOP: metoprolol tartrate START: metoprolol succinate 75 mg (1 and a half tablets) by mouth once daily   *If you need a refill on your cardiac medications before your next appointment, please call your pharmacy*   Lab Work: TODAY: BMET and CBC If you have labs (blood work) drawn today and your tests are completely normal, you will receive your results only by: [place] (if you have MyChart) OR A paper copy in the mail If you have any lab test that is abnormal or we need to change your treatment, we will call you to review the results.   Testing/Procedures: Your physician has requested that you have a cardiac MRI. Cardiac MRI uses a computer to create images of your heart as its beating, producing both still and moving pictures of your heart and major blood vessels. For further information please visit [email or web address] Please follow the instruction sheet given to you today for more information.   Your physician has requested that you have a stress echocardiogram. For further information please visit [email or web address]. Please follow instruction sheet as given.    Follow-Up: At [facility], you and your health needs are our priority.  As part of our continuing mission to provide you with exceptional heart care, we have created designated Provider Care Teams.  These Care Teams include your primary Cardiologist (physician) and Advanced Practice Providers (APPs -  Physician Assistants and Nurse Practitioners) who all work together to provide you with the care you need, when you need it.    Your next appointment:   2 - 53month(s)  The format for your next appointment:   In Person  Provider:   Rudean Haskell, MD      Other Instructions    You are scheduled for Cardiac MRI on _to be determined____. Please arrive at the [facility] main entrance of [facility] at ___to be determined___ (30-45 minutes prior to test start time). ?  [facility] [address] [place], [place] [address] [phone]  Please take advantage of the free valet parking available at the MAIN entrance (A entrance). Proceed to the [facility] Radiology Department (First Floor). ? Magnetic resonance imaging (MRI) is a painless test that produces images of the inside of the body without using Xrays.  During an MRI, strong magnets and radio waves work together in a Research officer, political party to form detailed images.   MRI images may provide more details about a medical condition than X-rays, CT scans, and ultrasounds can provide.  You may be given earphones to listen for instructions.  You may eat a light breakfast and take medications as ordered with the exception of Furosemide. Please avoid stimulants for 12 hr prior to test. (Ie. Caffeine, nicotine, chocolate, or antihistamine medications)  If a contrast material will be used, an IV will be inserted into one of your veins. Contrast material will be injected into your IV. It will leave your body through your urine within a day. You may be told to drink plenty of fluids to help flush the contrast material out of your system.  You will be asked to remove all metal, including: Watch, jewelry, and other metal objects including hearing aids, hair pieces and dentures. Also wearable glucose monitoring systems (ie. Freestyle Libre and Omnipods) (Braces and fillings normally are not a problem.)   TEST WILL TAKE APPROXIMATELY 1 HOUR  PLEASE NOTIFY SCHEDULING AT LEAST 24  HOURS IN ADVANCE IF YOU ARE UNABLE TO KEEP YOUR APPOINTMENT. [phone]  Please call Marchia Bond, cardiac imaging nurse navigator with any questions/concerns. Marchia Bond RN Navigator Cardiac Imaging Gordy Clement RN Navigator Cardiac Imaging [facility] Heart and Vascular Services [phone] Office

## 2022-01-03 ENCOUNTER — Other Ambulatory Visit: Payer: Self-pay

## 2022-01-03 ENCOUNTER — Other Ambulatory Visit: Payer: Medicare Other | Admitting: *Deleted

## 2022-01-03 DIAGNOSIS — I421 Obstructive hypertrophic cardiomyopathy: Secondary | ICD-10-CM

## 2022-01-04 LAB — CBC
Hematocrit: 36.7 % (ref 34.0–46.6)
Hemoglobin: 13.4 g/dL (ref 11.1–15.9)
MCH: 33.9 pg — ABNORMAL HIGH (ref 26.6–33.0)
MCHC: 36.5 g/dL — ABNORMAL HIGH (ref 31.5–35.7)
MCV: 93 fL (ref 79–97)
Platelets: 347 10*3/uL (ref 150–450)
RBC: 3.95 x10E6/uL (ref 3.77–5.28)
RDW: 13.9 % (ref 11.7–15.4)
WBC: 7.3 10*3/uL (ref 3.4–10.8)

## 2022-01-04 LAB — BASIC METABOLIC PANEL
BUN/Creatinine Ratio: 14 (ref 12–28)
BUN: 11 mg/dL (ref 8–27)
CO2: 30 mmol/L — ABNORMAL HIGH (ref 20–29)
Calcium: 9.4 mg/dL (ref 8.7–10.3)
Chloride: 90 mmol/L — ABNORMAL LOW (ref 96–106)
Creatinine, Ser: 0.79 mg/dL (ref 0.57–1.00)
Glucose: 104 mg/dL — ABNORMAL HIGH (ref 70–99)
Potassium: 3.4 mmol/L — ABNORMAL LOW (ref 3.5–5.2)
Sodium: 133 mmol/L — ABNORMAL LOW (ref 134–144)
eGFR: 81 mL/min/{1.73_m2} (ref >59)

## 2022-01-06 ENCOUNTER — Telehealth: Payer: Self-pay | Admitting: *Deleted

## 2022-01-06 MED ORDER — POTASSIUM CHLORIDE CRYS ER 20 MEQ PO TBCR: 60.0000 meq | EXTENDED_RELEASE_TABLET | Freq: Every day | ORAL | 3 refills | Status: DC

## 2022-01-06 NOTE — Telephone Encounter (Signed)
The patient has been notified of the result and verbalized understanding.  All questions (if any) were answered. Precious Gilding, RN 01/06/2022 1:50 PM

## 2022-01-06 NOTE — Telephone Encounter (Signed)
-----   Message from Jettie Booze, MD sent at 01/05/2022  9:21 PM EST ----- Sodium improved as well. ----- Message ----- From: Werner Lean, MD Sent: 01/05/2022   1:51 PM EST To: Jettie Booze, MD, #  On for CMR K is low but improved in crease from 40 mg to 60 meq ----- Message ----- From: Interface, [facility] Lab Results In Sent: 01/04/2022   6:36 AM EST To: Werner Lean, MD

## 2022-01-10 ENCOUNTER — Telehealth: Payer: Self-pay

## 2022-01-10 NOTE — Telephone Encounter (Signed)
Jettie Booze, MD  Werner Lean, MD; Drue Novel I, RN Sounds good.  I have a low suspicion for CAD given her prior CTA result.   JV        Previous Messages   ----- Message -----  From: Werner Lean, MD  Sent: 01/09/2022  12:28 PM EST  To: Jettie Booze, MD, Cleon Gustin, RN  Subject: RE: Cardiac CT and Metoprolol Dose             Unfortunately, there is a technical issues with both successfully getting the gradients and the WM.  If you are amenable; let's see if we get her gradients under control and then re-evaluate her sx burden.  If her gradients are good and she still have sx, we can re-address CCTA.   Thanks,  MAC   ----- Message -----  From: Jettie Booze, MD  Sent: 01/07/2022  10:08 AM EST  To: Cleon Gustin, RN, *  Subject: RE: Cardiac CT and Metoprolol Dose             Mahesh,   Can we use the stress echo as a substitute for the cardiac CT?  I have a low suspicion for CAD given her prior CTA.   JV

## 2022-01-10 NOTE — Telephone Encounter (Signed)
Called and made patient aware that we will hold off on Cardiac CT for now. Patient verbalized understanding and thanked me for the call.

## 2022-01-13 NOTE — Telephone Encounter (Signed)
Called patient about her message. Patient stated she has been feeling more SOB with activity and just not having any energy. Patient stated that just getting dressed and doing small things around the house, that she feels exhausted. BP being running around 128/83, no HR to report. Will send message to Dr. Irish Lack for advisement.

## 2022-01-15 NOTE — Telephone Encounter (Signed)
OK to schedule cath based on recs from Dr. Gasper Sells.  I don't need to see her before as she has been seen in the office recently.  I will have to go over the cath with her either via phone or at the hospital prior to the procedure.  Would plan to use her right radial and right antecubital areas if possible to perform procedure.   JV

## 2022-01-15 NOTE — Telephone Encounter (Signed)
I placed call to patient regarding cath.  Left message to call office

## 2022-01-15 NOTE — Telephone Encounter (Signed)
I spoke with patient.  She would like to proceed with cath.  Patient is scheduled for stress echo on 2/16 and cardiac MRI on 2/22.  Will check with Dr Irish Lack regarding timing of cath

## 2022-01-17 ENCOUNTER — Encounter: Payer: Self-pay | Admitting: *Deleted

## 2022-01-20 NOTE — Telephone Encounter (Signed)
I placed call to patient to discuss symptoms.  Left message to call office.  Message also sent to patient through my chart

## 2022-01-21 ENCOUNTER — Telehealth (HOSPITAL_COMMUNITY): Payer: Self-pay

## 2022-01-21 NOTE — Telephone Encounter (Signed)
Spoke with the patient, detailed instructions given. She stated that she would be here for her test. Asked to call back with any questions. S.Eduar Kumpf EMTP 

## 2022-01-23 ENCOUNTER — Other Ambulatory Visit: Payer: Self-pay

## 2022-01-23 ENCOUNTER — Ambulatory Visit (HOSPITAL_COMMUNITY): Payer: Medicare Other | Attending: Internal Medicine

## 2022-01-23 ENCOUNTER — Ambulatory Visit (HOSPITAL_COMMUNITY): Payer: Medicare Other

## 2022-01-23 ENCOUNTER — Other Ambulatory Visit: Payer: Medicare Other

## 2022-01-23 DIAGNOSIS — R0602 Shortness of breath: Secondary | ICD-10-CM | POA: Diagnosis not present

## 2022-01-23 DIAGNOSIS — Z01812 Encounter for preprocedural laboratory examination: Secondary | ICD-10-CM | POA: Diagnosis not present

## 2022-01-23 DIAGNOSIS — I421 Obstructive hypertrophic cardiomyopathy: Secondary | ICD-10-CM | POA: Diagnosis not present

## 2022-01-23 LAB — ECHOCARDIOGRAM STRESS TEST
Area-P 1/2: 3.03 cm2
S' Lateral: 2.1 cm

## 2022-01-24 LAB — CBC
Hematocrit: 40.6 % (ref 34.0–46.6)
Hemoglobin: 14.3 g/dL (ref 11.1–15.9)
MCH: 33.6 pg — ABNORMAL HIGH (ref 26.6–33.0)
MCHC: 35.2 g/dL (ref 31.5–35.7)
MCV: 95 fL (ref 79–97)
Platelets: 285 10*3/uL (ref 150–450)
RBC: 4.26 x10E6/uL (ref 3.77–5.28)
RDW: 13.1 % (ref 11.7–15.4)
WBC: 6.1 10*3/uL (ref 3.4–10.8)

## 2022-01-24 LAB — BASIC METABOLIC PANEL
BUN/Creatinine Ratio: 13 (ref 12–28)
BUN: 8 mg/dL (ref 8–27)
CO2: 24 mmol/L (ref 20–29)
Calcium: 9.6 mg/dL (ref 8.7–10.3)
Chloride: 93 mmol/L — ABNORMAL LOW (ref 96–106)
Creatinine, Ser: 0.62 mg/dL (ref 0.57–1.00)
Glucose: 95 mg/dL (ref 70–99)
Potassium: 4.7 mmol/L (ref 3.5–5.2)
Sodium: 132 mmol/L — ABNORMAL LOW (ref 134–144)
eGFR: 97 mL/min/{1.73_m2} (ref >59)

## 2022-01-27 ENCOUNTER — Telehealth: Payer: Self-pay | Admitting: *Deleted

## 2022-01-27 MED ORDER — FUROSEMIDE 40 MG PO TABS: 80.0000 mg | ORAL_TABLET | Freq: Every day | ORAL | 1 refills | Status: DC

## 2022-01-27 NOTE — Telephone Encounter (Addendum)
Cardiac catheterization scheduled at [facility] for: Tuesday January 28, 2022 7:30 AM Arrive Main Entrance A [facility]) at 5:30 AM   Diet-no solid food after midnight prior to cath, clear liquids until 5 AM day of procedure.  Medication instructions for procedure: -Hold:  Lasix/KCl-AM of procedure  -Except hold medications usual morning medications can be taken pre-cath with sips of water including aspirin 81 mg.    Must have responsible adult to drive home post procedure and be with patient first 24 hours after arriving home.  [facility] does allow one visitor to wait in the waiting room during the time you are there.   Patient reports does not currently have any new symptoms concerning for COVID-19 and no household members with COVID-19 like illness.     Reviewed procedure instructions with patient.

## 2022-01-28 ENCOUNTER — Encounter (HOSPITAL_COMMUNITY)
Admission: RE | Disposition: A | Discharge status: Home / Self Care | Payer: Medicare Other | Source: Home / Self Care | Attending: Interventional Cardiology

## 2022-01-28 ENCOUNTER — Other Ambulatory Visit: Payer: Self-pay

## 2022-01-28 ENCOUNTER — Telehealth (HOSPITAL_COMMUNITY): Payer: Self-pay | Admitting: Emergency Medicine

## 2022-01-28 ENCOUNTER — Ambulatory Visit (HOSPITAL_COMMUNITY)
Admission: RE | Admit: 2022-01-28 | Discharge: 2022-01-28 | Disposition: A | Discharge status: Home / Self Care | Payer: Medicare Other | Attending: Interventional Cardiology | Admitting: Interventional Cardiology

## 2022-01-28 DIAGNOSIS — Z79899 Other long term (current) drug therapy: Secondary | ICD-10-CM | POA: Insufficient documentation

## 2022-01-28 DIAGNOSIS — Z8679 Personal history of other diseases of the circulatory system: Secondary | ICD-10-CM | POA: Insufficient documentation

## 2022-01-28 DIAGNOSIS — Z8249 Family history of ischemic heart disease and other diseases of the circulatory system: Secondary | ICD-10-CM | POA: Diagnosis not present

## 2022-01-28 DIAGNOSIS — R0602 Shortness of breath: Secondary | ICD-10-CM | POA: Insufficient documentation

## 2022-01-28 DIAGNOSIS — E785 Hyperlipidemia, unspecified: Secondary | ICD-10-CM | POA: Diagnosis not present

## 2022-01-28 DIAGNOSIS — I421 Obstructive hypertrophic cardiomyopathy: Secondary | ICD-10-CM | POA: Insufficient documentation

## 2022-01-28 DIAGNOSIS — I2510 Atherosclerotic heart disease of native coronary artery without angina pectoris: Secondary | ICD-10-CM | POA: Insufficient documentation

## 2022-01-28 DIAGNOSIS — R0609 Other forms of dyspnea: Secondary | ICD-10-CM

## 2022-01-28 DIAGNOSIS — Z87891 Personal history of nicotine dependence: Secondary | ICD-10-CM | POA: Diagnosis not present

## 2022-01-28 HISTORY — PX: RIGHT/LEFT HEART CATH AND CORONARY ANGIOGRAPHY: CATH118266

## 2022-01-28 LAB — POCT I-STAT 7, (LYTES, BLD GAS, ICA,H+H)
Acid-Base Excess: 1 mmol/L (ref 0.0–2.0)
Bicarbonate: 26.3 mmol/L (ref 20.0–28.0)
Calcium, Ion: 1.26 mmol/L (ref 1.15–1.40)
HCT: 35 % — ABNORMAL LOW (ref 36.0–46.0)
Hemoglobin: 11.9 g/dL — ABNORMAL LOW (ref 12.0–15.0)
O2 Saturation: 94 %
Potassium: 3.9 mmol/L (ref 3.5–5.1)
Sodium: 132 mmol/L — ABNORMAL LOW (ref 135–145)
TCO2: 28 mmol/L (ref 22–32)
pCO2 arterial: 42.2 mmHg (ref 32–48)
pH, Arterial: 7.402 (ref 7.35–7.45)
pO2, Arterial: 72 mmHg — ABNORMAL LOW (ref 83–108)

## 2022-01-28 LAB — POCT I-STAT EG7
Acid-Base Excess: 1 mmol/L (ref 0.0–2.0)
Acid-Base Excess: 2 mmol/L (ref 0.0–2.0)
Bicarbonate: 27 mmol/L (ref 20.0–28.0)
Bicarbonate: 27.6 mmol/L (ref 20.0–28.0)
Calcium, Ion: 1.27 mmol/L (ref 1.15–1.40)
Calcium, Ion: 1.27 mmol/L (ref 1.15–1.40)
HCT: 34 % — ABNORMAL LOW (ref 36.0–46.0)
HCT: 35 % — ABNORMAL LOW (ref 36.0–46.0)
Hemoglobin: 11.6 g/dL — ABNORMAL LOW (ref 12.0–15.0)
Hemoglobin: 11.9 g/dL — ABNORMAL LOW (ref 12.0–15.0)
O2 Saturation: 69 %
O2 Saturation: 71 %
Potassium: 3.8 mmol/L (ref 3.5–5.1)
Potassium: 3.8 mmol/L (ref 3.5–5.1)
Sodium: 132 mmol/L — ABNORMAL LOW (ref 135–145)
Sodium: 133 mmol/L — ABNORMAL LOW (ref 135–145)
TCO2: 28 mmol/L (ref 22–32)
TCO2: 29 mmol/L (ref 22–32)
pCO2, Ven: 47.3 mmHg (ref 44–60)
pCO2, Ven: 47.9 mmHg (ref 44–60)
pH, Ven: 7.365 (ref 7.25–7.43)
pH, Ven: 7.368 (ref 7.25–7.43)
pO2, Ven: 38 mmHg (ref 32–45)
pO2, Ven: 39 mmHg (ref 32–45)

## 2022-01-28 SURGERY — RIGHT/LEFT HEART CATH AND CORONARY ANGIOGRAPHY
Anesthesia: LOCAL

## 2022-01-28 MED ORDER — SODIUM CHLORIDE 0.9% FLUSH: 3.0000 mL | Freq: Two times a day (BID) | INTRAVENOUS | Status: DC

## 2022-01-28 MED ORDER — SODIUM CHLORIDE 0.9% FLUSH: 3.0000 mL | INTRAVENOUS | Status: DC | PRN

## 2022-01-28 MED ORDER — ONDANSETRON HCL 4 MG/2ML IJ SOLN: 4.0000 mg | Freq: Four times a day (QID) | INTRAMUSCULAR | Status: DC | PRN

## 2022-01-28 MED ORDER — MIDAZOLAM HCL 2 MG/2ML IJ SOLN
INTRAMUSCULAR | Status: AC
  Filled 2022-01-28: qty 2

## 2022-01-28 MED ORDER — HEPARIN SODIUM (PORCINE) 1000 UNIT/ML IJ SOLN
INTRAMUSCULAR | Status: AC
  Filled 2022-01-28: qty 10

## 2022-01-28 MED ORDER — SODIUM CHLORIDE 0.9 % WEIGHT BASED INFUSION
3.0000 mL/kg/h | INTRAVENOUS | Status: AC
  Administered 2022-01-28: 3 mL/kg/h via INTRAVENOUS

## 2022-01-28 MED ORDER — LABETALOL HCL 5 MG/ML IV SOLN: 10.0000 mg | INTRAVENOUS | Status: DC | PRN

## 2022-01-28 MED ORDER — HEPARIN (PORCINE) IN NACL 1000-0.9 UT/500ML-% IV SOLN
INTRAVENOUS | Status: AC
  Filled 2022-01-28: qty 1000

## 2022-01-28 MED ORDER — ACETAMINOPHEN 325 MG PO TABS: 650.0000 mg | ORAL_TABLET | ORAL | Status: DC | PRN

## 2022-01-28 MED ORDER — LIDOCAINE HCL (PF) 1 % IJ SOLN
INTRAMUSCULAR | Status: DC | PRN
  Administered 2022-01-28: 5 mL

## 2022-01-28 MED ORDER — VERAPAMIL HCL 2.5 MG/ML IV SOLN
INTRAVENOUS | Status: AC
  Filled 2022-01-28: qty 2

## 2022-01-28 MED ORDER — HEPARIN (PORCINE) IN NACL 1000-0.9 UT/500ML-% IV SOLN
INTRAVENOUS | Status: DC | PRN
  Administered 2022-01-28 (×2): 500 mL

## 2022-01-28 MED ORDER — ASPIRIN 81 MG PO CHEW: 81.0000 mg | CHEWABLE_TABLET | ORAL | Status: AC

## 2022-01-28 MED ORDER — SODIUM CHLORIDE 0.9 % IV SOLN: 250.0000 mL | INTRAVENOUS | Status: DC | PRN

## 2022-01-28 MED ORDER — FENTANYL CITRATE (PF) 100 MCG/2ML IJ SOLN
INTRAMUSCULAR | Status: DC | PRN
  Administered 2022-01-28: 50 ug via INTRAVENOUS
  Administered 2022-01-28 (×2): 25 ug via INTRAVENOUS

## 2022-01-28 MED ORDER — IOHEXOL 350 MG/ML SOLN
INTRAVENOUS | Status: DC | PRN
  Administered 2022-01-28: 65 mL via INTRA_ARTERIAL

## 2022-01-28 MED ORDER — VERAPAMIL HCL 2.5 MG/ML IV SOLN
INTRAVENOUS | Status: DC | PRN
  Administered 2022-01-28: 10 mL via INTRA_ARTERIAL

## 2022-01-28 MED ORDER — SODIUM CHLORIDE 0.9 % IV SOLN: INTRAVENOUS | Status: AC

## 2022-01-28 MED ORDER — LIDOCAINE HCL (PF) 1 % IJ SOLN
INTRAMUSCULAR | Status: AC
  Filled 2022-01-28: qty 30

## 2022-01-28 MED ORDER — MIDAZOLAM HCL 2 MG/2ML IJ SOLN
INTRAMUSCULAR | Status: DC | PRN
  Administered 2022-01-28 (×2): 1 mg via INTRAVENOUS
  Administered 2022-01-28: 2 mg via INTRAVENOUS

## 2022-01-28 MED ORDER — HEPARIN SODIUM (PORCINE) 1000 UNIT/ML IJ SOLN
INTRAMUSCULAR | Status: DC | PRN
  Administered 2022-01-28: 3500 [IU] via INTRAVENOUS

## 2022-01-28 MED ORDER — SODIUM CHLORIDE 0.9 % WEIGHT BASED INFUSION: 1.0000 mL/kg/h | INTRAVENOUS | Status: DC

## 2022-01-28 MED ORDER — HYDRALAZINE HCL 20 MG/ML IJ SOLN: 10.0000 mg | INTRAMUSCULAR | Status: DC | PRN

## 2022-01-28 MED ORDER — FENTANYL CITRATE (PF) 100 MCG/2ML IJ SOLN
INTRAMUSCULAR | Status: AC
  Filled 2022-01-28: qty 2

## 2022-01-28 SURGICAL SUPPLY — 13 items
CATH 5FR JL3.5 JR4 ANG PIG MP (CATHETERS) ×1 IMPLANT
CATH BALLN WEDGE 5F 110CM (CATHETERS) ×1 IMPLANT
DEVICE RAD COMP TR BAND LRG (VASCULAR PRODUCTS) ×1 IMPLANT
GLIDESHEATH SLEND SS 6F .021 (SHEATH) ×1 IMPLANT
GUIDEWIRE INQWIRE 1.5J.035X260 (WIRE) IMPLANT
INQWIRE 1.5J .035X260CM (WIRE) ×2
KIT HEART LEFT (KITS) ×3 IMPLANT
PACK CARDIAC CATHETERIZATION (CUSTOM PROCEDURE TRAY) ×3 IMPLANT
SHEATH GLIDE SLENDER 4/5FR (SHEATH) ×1 IMPLANT
SHEATH PROBE COVER 6X72 (BAG) ×1 IMPLANT
TRANSDUCER W/STOPCOCK (MISCELLANEOUS) ×3 IMPLANT
TUBING CIL FLEX 10 FLL-RA (TUBING) ×3 IMPLANT
WIRE HI TORQ VERSACORE-J 145CM (WIRE) ×1 IMPLANT

## 2022-01-28 NOTE — Interval H&P Note (Signed)
History and Physical Interval Note:  01/28/2022 7:30 AM  Sheri Arellano  has presented today for surgery, with the diagnosis of shortness of breath.  The various methods of treatment have been discussed with the patient and family. After consideration of risks, benefits and other options for treatment, the patient has consented to  Procedure(s): RIGHT/LEFT HEART CATH AND CORONARY ANGIOGRAPHY (N/A) as a surgical intervention.  The patient's history has been reviewed, patient examined, no change in status, stable for surgery.  I have reviewed the patient's chart and labs.  Questions were answered to the patient's satisfaction.    All questions about cath answered.  She is famililar with cath as her husband had this procedure done several years ago.    Larae Grooms

## 2022-01-28 NOTE — Telephone Encounter (Signed)
Attempted to call patient regarding upcoming cardiac CT appointment. °Left message on voicemail with name and callback number °Chong Wojdyla RN Navigator Cardiac Imaging °[facility] °[phone] Office °[phone] Cell ° °

## 2022-01-28 NOTE — Progress Notes (Signed)
TR BAND REMOVAL  LOCATION:    right radial  DEFLATED PER PROTOCOL:    Yes.    TIME BAND OFF / DRESSING APPLIED:    1015   SITE UPON ARRIVAL:    Level 0  SITE AFTER BAND REMOVAL:    Level 0  CIRCULATION SENSATION AND MOVEMENT:    Within Normal Limits   Yes.    COMMENTS:

## 2022-01-28 NOTE — Telephone Encounter (Signed)
Attempted to call patient regarding upcoming cardiac MR appointment. Left message on voicemail with name and callback number Domonic Kimball RN Navigator Cardiac Imaging [facility] [phone] Office [phone] Cell  

## 2022-01-29 ENCOUNTER — Encounter (HOSPITAL_COMMUNITY): Payer: Self-pay | Admitting: Interventional Cardiology

## 2022-01-29 ENCOUNTER — Ambulatory Visit (HOSPITAL_COMMUNITY)
Admission: RE | Admit: 2022-01-29 | Discharge: 2022-01-29 | Disposition: A | Discharge status: Home / Self Care | Payer: Medicare Other | Source: Ambulatory Visit | Attending: Internal Medicine | Admitting: Internal Medicine

## 2022-01-29 DIAGNOSIS — I421 Obstructive hypertrophic cardiomyopathy: Secondary | ICD-10-CM | POA: Insufficient documentation

## 2022-01-29 MED ORDER — GADOBUTROL 1 MMOL/ML IV SOLN
10.0000 mL | Freq: Once | INTRAVENOUS | Status: AC | PRN
  Administered 2022-01-29: 10 mL via INTRAVENOUS

## 2022-02-04 DIAGNOSIS — Z1389 Encounter for screening for other disorder: Secondary | ICD-10-CM | POA: Diagnosis not present

## 2022-02-04 DIAGNOSIS — E782 Mixed hyperlipidemia: Secondary | ICD-10-CM | POA: Diagnosis not present

## 2022-02-04 DIAGNOSIS — D509 Iron deficiency anemia, unspecified: Secondary | ICD-10-CM | POA: Diagnosis not present

## 2022-02-04 DIAGNOSIS — I10 Essential (primary) hypertension: Secondary | ICD-10-CM | POA: Diagnosis not present

## 2022-02-04 DIAGNOSIS — I700 Atherosclerosis of aorta: Secondary | ICD-10-CM | POA: Diagnosis not present

## 2022-02-04 DIAGNOSIS — Z0000 Encounter for general adult medical examination without abnormal findings: Secondary | ICD-10-CM | POA: Diagnosis not present

## 2022-02-09 NOTE — Progress Notes (Signed)
Cardiology Office Note:    Date:  02/10/2022   ID:  Sheri Arellano, DOB 1951/06/04, MRN [id number]  PCP:  Josetta Huddle, MD   [facility] Providers Cardiologist:  Larae Grooms, MD     Referring MD: Josetta Huddle, MD   CC:  HOCM  History of Present Illness:    Sheri Arellano is a 71 y.o. female with a hx of SVT ablation (AVNRT- Lovena Le 2016), hx of DCIS with taxane based chemptherapy and 60 Gy of chest radiation,  with SOB found to have HOCM Seen 01/01/22.In interval has improvement in gradients on BB.    Patient notes that she is doing much better.   Notes that she had a ball of nerves in her throat, that has been an issues for years. Notes that prior to start AV nodal therapy   Chest pain is now rare.  No resting SOB and DOE has improved.  Never had syncope or near syncope. and no PND/Orthopnea.  No weight gain or leg swelling.  No palpitations or syncope. She has decrease to lasix 40 mg PO daily and metoprolol 50 mg po daily.   Past Medical History:  Diagnosis Date   Anxiety    probable panic attacks   Cancer ([place])    breast   Dysphagia    Esophageal dysmotility    GERD (gastroesophageal reflux disease)    H/O hiatal hernia    Hyperlipidemia    Insomnia    Iron deficiency anemia    hx   Other malaise and fatigue    Radiation 06/05/14-07/18/14   left breast 50.4 gray, lumpectomy cavity boosted to 60.4 gray   SVT (supraventricular tachycardia) (HCC)    documented-takes metoprolol as needed   Vitamin D deficiency     Past Surgical History:  Procedure Laterality Date   CARPAL TUNNEL RELEASE     lt and rt   CHOLECYSTECTOMY  1999   COLONOSCOPY WITH PROPOFOL N/A 03/20/2015   Procedure: COLONOSCOPY WITH PROPOFOL;  Surgeon: Garlan Fair, MD;  Location: [facility];  Service: Endoscopy;  Laterality: N/A;   ELECTROPHYSIOLOGIC STUDY N/A 05/30/2015   Procedure: SVT Ablation;  Surgeon: Evans Lance, MD;  Location: [facility];  Service: Cardiovascular;   Laterality: N/A;   PARTIAL MASTECTOMY WITH NEEDLE LOCALIZATION AND AXILLARY SENTINEL LYMPH NODE BX Left 01/16/2014   Procedure: PARTIAL MASTECTOMY WITH NEEDLE LOCALIZATION AND AXILLARY SENTINEL LYMPH NODE BIOPSY;  Surgeon: [name], MD;  Location: [place];  Service: General;  Laterality: Left;   PORT-A-CATH REMOVAL Right 08/23/2014   Procedure: MINOR REMOVAL PORT-A-CATH;  Surgeon: Fanny Skates, MD;  Location: [place];  Service: General;  Laterality: Right;   PORTACATH PLACEMENT Right 02/28/2014   Procedure: INSERTION PORT-A-CATH;  Surgeon: [name], MD;  Location: [place];  Service: General;  Laterality: Right;   RIGHT/LEFT HEART CATH AND CORONARY ANGIOGRAPHY N/A 01/28/2022   Procedure: RIGHT/LEFT HEART CATH AND CORONARY ANGIOGRAPHY;  Surgeon: Jettie Booze, MD;  Location: [facility];  Service: Cardiovascular;  Laterality: N/A;    Current Medications: Current Meds  Medication Sig   acetaminophen-codeine (TYLENOL #3) 300-30 MG tablet Take 1 tablet by mouth every 8 (eight) hours as needed for moderate pain.   aspirin EC 81 MG tablet Take 81 mg by mouth daily.   Brimonidine Tartrate (LUMIFY) 0.025 % SOLN Place 1 drop into both eyes 2 (two) times daily as needed (dry eyes).   celecoxib (CELEBREX) 200 MG  capsule Take 1 capsule by mouth every 12 (twelve) hours.   Cholecalciferol (VITAMIN D) 2000 UNITS tablet Take 2,000 Units by mouth daily.   clonazePAM (KLONOPIN) 1 MG tablet Take 1 mg by mouth at bedtime.   doxycycline (VIBRAMYCIN) 100 MG capsule Take 100 mg by mouth daily. Daily for Rosacea   finasteride (PROSCAR) 5 MG tablet Take 1.25 mg by mouth daily.   furosemide (LASIX) 40 MG tablet Take 2 tablets (80 mg total) by mouth daily. (Patient taking differently: Take 40 mg by mouth daily.)   loratadine (CLARITIN) 10 MG tablet Take 10 mg by mouth every evening.    Magnesium 250 MG TABS Take 250 mg by mouth in the morning and at bedtime.    metoprolol succinate (TOPROL-XL) 50 MG 24 hr tablet Take 1.5 tablets (75 mg total) by mouth daily. Take with or immediately following a meal.   nitroGLYCERIN (NITROSTAT) 0.4 MG SL tablet Place 1 tablet (0.4 mg total) under the tongue every 5 (five) minutes as needed (Esophageal spasms).   pantoprazole (PROTONIX) 40 MG tablet Take 40 mg by mouth daily.   potassium chloride SA (KLOR-CON M) 20 MEQ tablet Take 3 tablets (60 mEq total) by mouth daily.   rosuvastatin (CRESTOR) 10 MG tablet Take 10 mg by mouth at bedtime.   sertraline (ZOLOFT) 100 MG tablet Take 100 mg by mouth at bedtime.   valACYclovir (VALTREX) 1000 MG tablet Take 1 g by mouth daily as needed (fever blisters).      Allergies:   Erythromycin base, Macrolides and ketolides, and Valsartan-hydrochlorothiazide   Social History   Socioeconomic History   Marital status: Married    Spouse name: Not on file   Number of children: 2   Years of education: Not on file   Highest education level: Not on file  Occupational History   Occupation: realtor    Employer: [employer]  Tobacco Use   Smoking status: Former    Packs/day: 1.50    Years: 8.00    Pack years: 12.00    Types: Cigarettes    Quit date: 01/22/1976    Years since quitting: 46.0   Smokeless tobacco: Never  Vaping Use   Vaping Use: Never used  Substance and Sexual Activity   Alcohol use: Yes    Alcohol/week: 7.0 standard drinks    Types: 7 Shots of liquor per week    Comment: 1 vodka per day   Drug use: No   Sexual activity: Yes    Birth control/protection: Post-menopausal  Other Topics Concern   Not on file  Social History Narrative   Not on file   Social Determinants of Health   Financial Resource Strain: Not on file  Food Insecurity: Not on file  Transportation Needs: Not on file  Physical Activity: Not on file  Stress: Not on file  Social Connections: Not on file    Social: 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 There is no history of Colon cancer or CAD. History of coronary artery disease notable for CAD female. History of heart failure notable for CHF mother. History of arrhythmia notable for atrial fibrillation mother. Denies family history of sudden cardiac death including drowning, car accidents, or unexplained deaths in the family.  No history of cardiomyopathies including hypertrophic cardiomyopathy, left ventricular non-compaction, or arrhythmogenic right ventricular cardiomyopathy.   ROS:   Please see the history of present illness.     All other systems reviewed and are negative.  EKGs/Labs/Other Studies Reviewed:    The following studies were reviewed today:  EKG:   12/12/21: SR rate 84 with LVH  Transthoracic Echocardiogram: Date:12/18/21 Results:  1. Left ventricular ejection fraction, by estimation, is 60 to 65%. The  left ventricle has normal function. The left ventricle has no regional  wall motion abnormalities. Moderate asymmetric septal left ventricular  hypertrophy. Mitral valve systolic  anterior motion noted. There was no significant LV outflow gradient  measured at rest but with valsalva there appeared to be a LV outflow  gradient up to 94 mmHg peak. Left ventricular diastolic parameters are  consistent with Grade I diastolic dysfunction  (impaired relaxation).   2. Right ventricular systolic function is normal. The right ventricular  size is normal. Tricuspid regurgitation signal is inadequate for assessing  PA pressure.   3. The aortic valve is tricuspid. Aortic valve regurgitation is not  visualized. Aortic valve sclerosis/calcification is present, without any  evidence of aortic stenosis.   4. The mitral  valve is abnormal, there was mitral valve SAM noted as  above. Trivial mitral valve regurgitation. No evidence of mitral stenosis.   5. The inferior vena cava is normal in size with greater than 50%  respiratory variability, suggesting right atrial pressure of 3 mmHg.   6. Possible hypertrophic cardiomyopathy with moderate asymmetric septal  hypertrophy, mitral valve SAM, and LVOT gradient that appears to be up to  94 mmHg peak with valsalva though no significant gradient measured at  rest.    Recent Labs: 02/22/2021: ALT 22 12/26/2021: Magnesium 2.0; NT-Pro BNP 402 01/23/2022: BUN 8; Creatinine, Ser 0.62; Platelets 285 01/28/2022: Hemoglobin 11.6; Hemoglobin 11.9; Potassium 3.8; Potassium 3.8; Sodium 132; Sodium 133  Recent Lipid Panel No results found for: CHOL, TRIG, HDL, CHOLHDL, VLDL, LDLCALC, LDLDIRECT   Physical Exam:    VS:  BP 118/70    Pulse 60    Ht 5' 2.5" (1.588 m)    Wt 161 lb (73 kg)    SpO2 96%    BMI 28.98 kg/m     Wt Readings from Last 3 Encounters:  02/10/22 161 lb (73 kg)  01/28/22 155 lb (70.3 kg)  01/01/22 158 lb (71.7 kg)     Gen: no distress, obese/well nourished/malnourished   Neck: No JVD,  Cardiac: No Rubs or Gallops, harsh systolic murmur worse with handgrip and standing , regular rhythm, +2 radial pulses Respiratory: Clear to auscultation bilaterally, normal effort, normal  respiratory rate GI: Soft, nontender, non-distended  MS: No  edema;  moves all extremities Integument: Skin feels warm Neuro:  At time of evaluation, alert and oriented to person/place/time/situation  Psych: Normal affect, patient feels a bit anxious   ASSESSMENT:    1. HOCM (hypertrophic obstructive cardiomyopathy) (HCC)     PLAN:    Hypertrophic Cardiomyopathy -  peak gradient 90 on Valsalva (< 50 mm Hg) - with associated MR and LVOT (inducible) - Family history not clear, discussed 1st degree family history screening and she is amenable to getting genetic testing -  NYHA I-II - CMR with no scar and 15 mm thickness - absence of atrial fibrillation -  2 year assessment for VT on rhythm monitor will be done at next visit, presently low risk for SCD - on metoprolol to  50 mg PO daily - we have discussed exercise in HCM using Semsarian et al as a Scaffold, she will start low intensity exercise without significant positional changes; we will get a CPET in 2-3 months - if worsening symptoms, we have talked about potentially starting  mavacamten; she would need to change to a different SSRI given potential medication interactions and we would do a fasting PM echo prior to starting and my cut her diuertic further  Overall happy to see that her symptoms have significantly improved  Will see in 3-4 months   Medication Adjustments/Labs and Tests Ordered: Current medicines are reviewed at length with the patient today.  Concerns regarding medicines are outlined above.  Orders Placed This Encounter  Procedures   Cardiopulmonary exercise test   Ambulatory referral to Genetics   Cardiac Stress Test: Informed Consent Details: Physician/Practitioner Attestation; Transcribe to consent form and obtain patient signature   No orders of the defined types were placed in this encounter.   Patient Instructions  Medication Instructions:  Your physician recommends that you continue on your current medications as directed. Please refer to the Current Medication list given to you today.  *If you need a refill on your cardiac medications before your next appointment, please call your pharmacy*   Lab Work: NONE If you have labs (blood work) drawn today and your tests are completely normal, you will receive your results only by: [place] (if you have MyChart) OR A paper copy in the mail If you have any lab test that is abnormal or we need to change your treatment, we will call you to review the results.   Testing/Procedures: Your physician has recommended that you  have a cardiopulmonary stress test (CPX) in 2-3 months. CPX testing is a non-invasive measurement of heart and lung function. It replaces a traditional treadmill stress test. This type of test provides a tremendous amount of information that relates not only to your present condition but also for future outcomes. This test combines measurements of you ventilation, respiratory gas exchange in the lungs, electrocardiogram (EKG), blood pressure and physical response before, during, and following an exercise protocol.   Your physician has referred you to see Genetics for Cardiomyopathy.    Follow-Up: At [facility], you and your health needs are our priority.  As part of our continuing mission to provide you with exceptional heart care, we have created designated Provider Care Teams.  These Care Teams include your primary Cardiologist (physician) and Advanced Practice Providers (APPs -  Physician Assistants and Nurse Practitioners) who all work together to provide you with the care you need, when you need it.  Your next appointment:   3 - 4 month(s)  The format for your next appointment:   In Person  Provider:   Rudean Haskell, MD    :1}     Signed, Werner Lean, MD  02/10/2022 10:46 AM    [place]

## 2022-02-10 ENCOUNTER — Ambulatory Visit: Payer: Medicare Other | Admitting: Internal Medicine

## 2022-02-10 ENCOUNTER — Other Ambulatory Visit: Payer: Self-pay

## 2022-02-10 ENCOUNTER — Encounter: Payer: Self-pay | Admitting: Internal Medicine

## 2022-02-10 VITALS — BP 118/70 | HR 60 | Ht 62.5 in | Wt 161.0 lb

## 2022-02-10 DIAGNOSIS — I421 Obstructive hypertrophic cardiomyopathy: Secondary | ICD-10-CM

## 2022-02-10 NOTE — Patient Instructions (Signed)
Medication Instructions:  ?Your physician recommends that you continue on your current medications as directed. Please refer to the Current Medication list given to you today. ? ?*If you need a refill on your cardiac medications before your next appointment, please call your pharmacy* ? ? ?Lab Work: ?NONE ?If you have labs (blood work) drawn today and your tests are completely normal, you will receive your results only by: ?MyChart Message (if you have MyChart) OR ?A paper copy in the mail ?If you have any lab test that is abnormal or we need to change your treatment, we will call you to review the results. ? ? ?Testing/Procedures: ?Your physician has recommended that you have a cardiopulmonary stress test (CPX) in 2-3 months. CPX testing is a non-invasive measurement of heart and lung function. It replaces a traditional treadmill stress test. This type of test provides a tremendous amount of information that relates not only to your present condition but also for future outcomes. This test combines measurements of you ventilation, respiratory gas exchange in the lungs, electrocardiogram (EKG), blood pressure and physical response before, during, and following an exercise protocol.  ? ?Your physician has referred you to see Genetics for Cardiomyopathy.  ? ? ?Follow-Up: ?At [facility], you and your health needs are our priority.  As part of our continuing mission to provide you with exceptional heart care, we have created designated Provider Care Teams.  These Care Teams include your primary Cardiologist (physician) and Advanced Practice Providers (APPs -  Physician Assistants and Nurse Practitioners) who all work together to provide you with the care you need, when you need it. ? ?Your next appointment:   ?3 - 4 month(s) ? ?The format for your next appointment:   ?In Person ? ?Provider:   ?Rudean Haskell, MD  ?  :1}  ? ?

## 2022-02-13 ENCOUNTER — Encounter: Payer: Self-pay | Admitting: Internal Medicine

## 2022-03-06 ENCOUNTER — Ambulatory Visit: Payer: Medicare Other | Admitting: Internal Medicine

## 2022-03-11 ENCOUNTER — Ambulatory Visit: Payer: Medicare Other | Admitting: Genetic Counselor

## 2022-03-11 DIAGNOSIS — Z1231 Encounter for screening mammogram for malignant neoplasm of breast: Secondary | ICD-10-CM | POA: Diagnosis not present

## 2022-03-13 NOTE — Progress Notes (Signed)
Pre Test GC ? ?Referring Provider: Rudean Haskell, MD  ? ?Referral Reason  ?Sheri Arellano was referred for genetic consult and testing of hypertrophic cardiomyopathy. ? ?Personal Medical Information ?Sheri Arellano (III.2 on pedigree) is a 71 y.o. 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 She has a past history of breast cancer at age 64 and ablation at 71 for tachycardia that was diagnosed at  68. She tells me that she has been feeling tired and winded for several years and attributed that to her age. On Dec 08, 2021, she had an acute onset of dizziness and called Dr. Hassell Done office. She underwent an echocardiogram that detected moderate asymmetric septal hypertrophy with IVS of 1.5 cm. LVPW at 0.7 cm and LVEF of 66%.  ? ?She tells me that for the last 5 years she has been having dyspnea, fatigue and chest discomfort as if there was an ?elephant sitting on my chest?. She mentions having episodic dizziness but denies having syncope. ? ?Traditional Risk Factors ?Sheri Arellano denies having hypertension or aortic valve stenosis that can also lead to cardiac hypertrophy.  ? ?Family history ?Sheri Arellano (III.2) has two sons, ages 1 and 14 (IV.4, IV.5). Both are in good health and she states that they would be willing to undergo screening for HCM. Her grandchildren, ages 11, 40 and 33 (V.1-V.3) are also in good health. Her brother, age 39 (III.1) and his three sons (IV.1-IV.3) do not have cardiac-related issues.  ? ?Sheri Arellano's parents lived to their 69s and died of natural causes. Her father (II.4) had ASCVD and had stents inserted at age 64 and 21. One sibling committed suicide in his 23s (II.2); the other siblings (I.1, I.3) lived to their 46s. Paternal grandfather (I.1) had dementia and died in his 64s and grandmother (I.2) died of natural causes at 64. She has three maternal aunts- 2 died in their 59s (II.7-II.8) and one succumbed to cancer at age 10 (II.6). Maternal grandfather (I.3) died of M.I. at 44 and  grandmother (I.4) died of complications from a stroke. ? ?Genetics ?Sheri Arellano was counseled on the genetics of hypertrophic cardiomyopathy (HCM). I explained to her that this is an autosomal dominant condition and hence her sons have a 50% chance of inheriting HCM. I explained to her that her first degree-relatives, namely children and siblings should seek regular surveillance for HCM.  Clinical screening involves echocardiogram and EKG at regular intervals, frequency is typically determined by age, with children in their teens being screened every 15-18 months and those over the age of 21 getting screened every 3-5 years. She verbalized understanding of this.  ? ?I discussed penetrance of HCM being incomplete i.e. not all individuals harboring a HCM mutation will present clinically with HCM, and age-related penetrance where clinical presentation of HCM increases with advanced age. I also reviewed variable expression of HCM and emphasized that this condition can express at any age at any level of severity in the family. Hence, it is important for her first-degree relatives to stay vigilant and seek regular surveillance for HCM. She verbalized understanding of this. In the absence of HCM or sudden death amongst her first-degree relatives, it is highly likely that she has a de novo mutation for HCM. She verbalized understanding. ?  ?I informed her that some patients - about 8-10% can have compound and digenic mutations for HCM. Also briefly discussed the inheritance pattern and treatment /management plans for the infiltrative cardiomyopathies that present as HCM phenocopies.  ? ?We walked through the process of  genetic testing.  ? ?I explained to her that genetic testing is a probabilistic test dependent upon her age and severity of presentation, presence of risk factors for HCM and importantly family history of HCM or sudden death in first-degree relatives. ? ?The potential outcomes of genetic testing and subsequent  management of at-risk family members were discussed so as to manage expectations- ? ?I explained to her that if a mutation is not identified, then all first-degree relatives should undergo regular screening for HCM. I emphasized that even if the genetic test is negative, it does not mean that she does not have HCM. A negative test result can be due to limitations of the genetic test. She verbalized understanding of this. ? ?There is also the likelihood of identifying a ?Variant of unknown significance?. This result means that the variant has not been detected in a statistically significant number of HCM patients and/or functional studies have not been performed to verify its pathogenicity. This VUS can be tested in the family to see if it segregates with disease. If a VUS is found, first-degree relatives should undergo regular clinical screening for HCM. ? ?If a pathogenic variant is reported, then her first-degree family members can get tested for this variant. If they test positive, it is likely they will develop HCM. In light of variable expression and incomplete penetrance associated with HCM, it is not possible to predict when they will manifest clinically with HCM. It is recommended that family members that test positive for the familial pathogenic variant pursue clinical screening for HCM. Family members that test negative for the familial mutation need not pursue periodic screening for HCM, but seek care if symptoms develop. ?  ?Impression  ?Sheri Arellano was found to have cardiac wall thickness indicative of HCM at age 51. Additionally, there is no family history of sudden death and HCM in her first-degree relatives. Genetic testing for HCM is recommended to stratify risk of HCM in her family. This test should include the major genes for HCM as well as the HCM phenocopies of Fabry disease, Danon disease, Wolf-Parkinson White syndrome and FTA. Genetic testing will confirm if she has a sarcomeric gene mutation or a  HCM phenocopy. ? ?In addition, we discussed the protections afforded by the Genetic Information Non-Discrimination Act (GINA). I explained to her that GINA protects her from losing her employment or health insurance based on her genotype. However, these protections do not cover life insurance and disability. She verbalized understanding of this and states that her children have term life insurance. ? ?Please note that the patient has not been counseled in this visit on personal, cultural or ethical issues that she may face due to her heart condition.  ? ?Plan ?Sheri Arellano's health insurance, Medicare will not cover genetic testing for HCM. Informed her of other labs that can perform the test at a very reduced price but will share her genetic information with other entities. She is not willing to pursue this route. Recommended that her sons undergo periodic screening for HCM. She is agreeable with this plan of action and will discuss this with her children. Blood was not drawn for genetic testing. ? ?Lattie Corns, Ph.D, [facility] ?Clinical Molecular Geneticist ? ?

## 2022-03-19 ENCOUNTER — Encounter: Payer: Self-pay | Admitting: Physician Assistant

## 2022-03-19 ENCOUNTER — Ambulatory Visit: Payer: Medicare Other | Admitting: Physician Assistant

## 2022-03-19 ENCOUNTER — Ambulatory Visit (INDEPENDENT_AMBULATORY_CARE_PROVIDER_SITE_OTHER): Payer: Medicare Other

## 2022-03-19 DIAGNOSIS — M1712 Unilateral primary osteoarthritis, left knee: Secondary | ICD-10-CM | POA: Diagnosis not present

## 2022-03-19 MED ORDER — TRAMADOL HCL 50 MG PO TABS: 50.0000 mg | ORAL_TABLET | Freq: Two times a day (BID) | ORAL | 2 refills | Status: AC | PRN

## 2022-03-19 MED ORDER — MELOXICAM 7.5 MG PO TABS: 7.5000 mg | ORAL_TABLET | Freq: Two times a day (BID) | ORAL | 2 refills | Status: DC | PRN

## 2022-03-19 MED ORDER — METHYLPREDNISOLONE ACETATE 40 MG/ML IJ SUSP
40.0000 mg | INTRAMUSCULAR | Status: AC | PRN
  Administered 2022-03-19: 40 mg via INTRA_ARTICULAR

## 2022-03-19 MED ORDER — BUPIVACAINE HCL 0.25 % IJ SOLN
2.0000 mL | INTRAMUSCULAR | Status: AC | PRN
  Administered 2022-03-19: 2 mL via INTRA_ARTICULAR

## 2022-03-19 MED ORDER — LIDOCAINE HCL 1 % IJ SOLN
2.0000 mL | INTRAMUSCULAR | Status: AC | PRN
  Administered 2022-03-19: 2 mL

## 2022-03-19 NOTE — Progress Notes (Signed)
? ?Office Visit Note ?  ?Patient: Sheri Arellano           ?Date of Birth: 07-11-1951           ?MRN: [id number] ?Visit Date: 03/19/2022 ?             ?Requested by: Josetta Huddle, MD ?[address] ?[address] ?[place],  [place] [address] ?PCP: Josetta Huddle, MD ? ? ?Assessment & Plan: ?Visit Diagnoses:  ?1. Primary osteoarthritis of left knee   ? ? ?Plan: Impression is left knee degenerative joint disease.  We had a discussion today about repeat cortisone injection as she had good relief back in 2021.  We have also discussed eventual need for total knee arthroplasty.  She will follow-up with [place] as needed. ? ?Follow-Up Instructions: Return if symptoms worsen or fail to improve.  ? ?Orders:  ?Orders Placed This Encounter  ?Procedures  ? Large Joint Inj: L knee  ? XR KNEE 3 VIEW LEFT  ? ?Meds ordered this encounter  ?Medications  ? traMADol (ULTRAM) 50 MG tablet  ?  Sig: Take 1 tablet (50 mg total) by mouth 2 (two) times daily as needed.  ?  Dispense:  60 tablet  ?  Refill:  2  ? meloxicam (MOBIC) 7.5 MG tablet  ?  Sig: Take 1 tablet (7.5 mg total) by mouth 2 (two) times daily as needed for up to 14 doses for pain.  ?  Dispense:  60 tablet  ?  Refill:  2  ? ? ? ? Procedures: ?Large Joint Inj: L knee on 03/19/2022 2:14 PM ?Indications: pain ?Details: 22 G needle, anterolateral approach ?Medications: 2 mL lidocaine 1 %; 2 mL bupivacaine 0.25 %; 40 mg methylPREDNISolone acetate 40 MG/ML ? ? ? ? ?Clinical Data: ?No additional findings. ? ? ?Subjective: ?Chief Complaint  ?Patient presents with  ? Left Knee - Pain  ? ? ?HPI patient is a pleasant 71 year old female who comes in today with recurrent left knee pain.  History of advanced degenerative joint disease to the left knee.  She has been complaining of medial sided pain worse with walking.  She has been taking Celebrex and using heating pad without relief.  She underwent left knee cortisone injection back in April 2021 with good relief until recently. ? ?Review of  Systems as detailed in HPI.  All others reviewed and are negative. ? ? ?Objective: ?Vital Signs: There were no vitals taken for this visit. ? ?Physical Exam well-developed well-nourished female no acute distress.  Alert and oriented x3. ? ?Ortho Exam left knee exam shows no effusion.  Range of motion 0 to 110 degrees.  Medial joint line tenderness.  She is neurovascular tact distally. ? ?Specialty Comments:  ?No specialty comments available. ? ?Imaging: ?XR KNEE 3 VIEW LEFT ? ?Result Date: 03/19/2022 ?X-rays demonstrate advanced degenerative changes medial patellofemoral compartments  ? ? ?PMFS History: ?Patient Active Problem List  ? Diagnosis Date Noted  ? Dyspnea on exertion   ? Greater trochanteric bursitis, right 07/03/2021  ? Acid reflux 07/25/2020  ? Anemia, iron deficiency 07/25/2020  ? Avitaminosis D 07/25/2020  ? Cannot sleep 07/25/2020  ? Combined fat and carbohydrate induced hyperlipemia 07/25/2020  ? Cough 07/25/2020  ? Current drug use 07/25/2020  ? Discharge from the vagina 07/25/2020  ? Fast heart beat 07/25/2020  ? Fatigue 07/25/2020  ? Pain in female genitalia on intercourse 07/25/2020  ? Bilateral hip pain 05/29/2020  ? SVT (supraventricular tachycardia) ([place]) 05/30/2015  ?  Atrophic vaginitis 07/26/2014  ? Hemorrhoids without complication 26/20/3559  ? Malignant neoplasm of upper-outer quadrant of left breast in female, estrogen receptor negative ([place]) 12/15/2013  ? Environmental allergies 05/21/2012  ? Rectal mucosa prolapse, anal skin tags 02/16/2012  ? HYPERCHOLESTEROLEMIA 03/28/2009  ? Anxiety state 03/28/2009  ? SUPRAVENTRICULAR TACHYCARDIA 03/28/2009  ? ?Past Medical History:  ?Diagnosis Date  ? Anxiety   ? probable panic attacks  ? Cancer [facility])   ? breast  ? Dysphagia   ? Esophageal dysmotility   ? GERD (gastroesophageal reflux disease)   ? H/O hiatal hernia   ? Hyperlipidemia   ? Insomnia   ? Iron deficiency anemia   ? hx  ? Other malaise and fatigue   ? Radiation 06/05/14-07/18/14  ? left  breast 50.4 gray, lumpectomy cavity boosted to 60.4 gray  ? SVT (supraventricular tachycardia) ([place])   ? documented-takes metoprolol as needed  ? Vitamin D deficiency   ?  ?Family History  ?Problem Relation Age of Onset  ? Atrial fibrillation Mother 78  ? Hyperlipidemia Mother   ? Hypertension Mother   ? Cancer Father 31  ?     bladder  ? Hypercholesterolemia Father   ? Hypertension Father   ? Cancer Maternal Aunt   ?     breast  ? Colon cancer Neg Hx   ? CAD Neg Hx   ?  ?Past Surgical History:  ?Procedure Laterality Date  ? CARPAL TUNNEL RELEASE    ? lt and rt  ? CHOLECYSTECTOMY  1999  ? COLONOSCOPY WITH PROPOFOL N/A 03/20/2015  ? Procedure: COLONOSCOPY WITH PROPOFOL;  Surgeon: Garlan Fair, MD;  Location: [facility];  Service: Endoscopy;  Laterality: N/A;  ? ELECTROPHYSIOLOGIC STUDY N/A 05/30/2015  ? Procedure: SVT Ablation;  Surgeon: Evans Lance, MD;  Location: [facility];  Service: Cardiovascular;  Laterality: N/A;  ? PARTIAL MASTECTOMY WITH NEEDLE LOCALIZATION AND AXILLARY SENTINEL LYMPH NODE BX Left 01/16/2014  ? Procedure: PARTIAL MASTECTOMY WITH NEEDLE LOCALIZATION AND AXILLARY SENTINEL LYMPH NODE BIOPSY;  Surgeon: [name], MD;  Location: [facility];  Service: General;  Laterality: Left;  ? PORT-A-CATH REMOVAL Right 08/23/2014  ? Procedure: MINOR REMOVAL PORT-A-CATH;  Surgeon: Fanny Skates, MD;  Location: [facility];  Service: General;  Laterality: Right;  ? PORTACATH PLACEMENT Right 02/28/2014  ? Procedure: INSERTION PORT-A-CATH;  Surgeon: [name], MD;  Location: [facility];  Service: General;  Laterality: Right;  ? RIGHT/LEFT HEART CATH AND CORONARY ANGIOGRAPHY N/A 01/28/2022  ? Procedure: RIGHT/LEFT HEART CATH AND CORONARY ANGIOGRAPHY;  Surgeon: Jettie Booze, MD;  Location: [facility];  Service: Cardiovascular;  Laterality: N/A;  ? ?Social History  ? ?Occupational History  ? Occupation: realtor  ?  Employer: [employer]   ?Tobacco Use  ? Smoking status: Former  ?  Packs/day: 1.50  ?  Years: 8.00  ?  Pack years: 12.00  ?  Types: Cigarettes  ?  Quit date: 01/22/1976  ?  Years since quitting: 46.1  ? Smokeless tobacco: Never  ?Vaping Use  ? Vaping Use: Never used  ?Substance and Sexual Activity  ? Alcohol use: Yes  ?  Alcohol/week: 7.0 standard drinks  ?  Types: 7 Shots of liquor per week  ?  Comment: 1 vodka per day  ? Drug use: No  ? Sexual activity: Yes  ?  Birth control/protection: Post-menopausal  ? ? ? ? ? ? ?

## 2022-03-26 ENCOUNTER — Encounter: Payer: Self-pay | Admitting: Interventional Cardiology

## 2022-04-29 ENCOUNTER — Encounter: Payer: Self-pay | Admitting: Interventional Cardiology

## 2022-05-06 DIAGNOSIS — D225 Melanocytic nevi of trunk: Secondary | ICD-10-CM | POA: Diagnosis not present

## 2022-05-06 DIAGNOSIS — L719 Rosacea, unspecified: Secondary | ICD-10-CM | POA: Diagnosis not present

## 2022-05-06 DIAGNOSIS — D2239 Melanocytic nevi of other parts of face: Secondary | ICD-10-CM | POA: Diagnosis not present

## 2022-05-06 DIAGNOSIS — L821 Other seborrheic keratosis: Secondary | ICD-10-CM | POA: Diagnosis not present

## 2022-05-07 DIAGNOSIS — H2513 Age-related nuclear cataract, bilateral: Secondary | ICD-10-CM | POA: Diagnosis not present

## 2022-05-07 DIAGNOSIS — H1045 Other chronic allergic conjunctivitis: Secondary | ICD-10-CM | POA: Diagnosis not present

## 2022-05-08 ENCOUNTER — Encounter: Payer: Self-pay | Admitting: Interventional Cardiology

## 2022-05-12 ENCOUNTER — Telehealth: Payer: Self-pay | Admitting: *Deleted

## 2022-05-12 NOTE — Telephone Encounter (Signed)
Patient just checked BP and it is 133/92.  He does not know what it was at Dr Inda Merlin office today.  His previous readings over the last few says were 119/75 and 112/71

## 2022-05-12 NOTE — Telephone Encounter (Signed)
I spoke with Sheri Arellano who reports he takes Metoprolol at 7:30 PM. Heart rate will go down to around 80 after he takes metoprolol.  At the current time his heart rate is 139.  He saw Dr Inda Merlin today and heart rate was 139 in the office.  Patient reports he is tired but otherwise feels OK Will review with provider. I gave patient number for my chart help desk and asked him to contact help desk for assistance in reactivating his account.

## 2022-05-12 NOTE — Telephone Encounter (Signed)
I spoke with patient and made her aware for safety reasons only messages regarding her care can be addressed in her my chart account.  She is aware they can call office with any questions if her spouse has issues with his my chart account.  Patient is asking if letter regarding her upcoming test has been mailed to her.

## 2022-05-13 NOTE — Telephone Encounter (Signed)
Called pt left a message informing that instructions for CPX were mailed.  I will also leave a copy of instructions at the front desk to be picked up.

## 2022-05-20 ENCOUNTER — Other Ambulatory Visit (HOSPITAL_COMMUNITY): Payer: Self-pay | Admitting: *Deleted

## 2022-05-20 ENCOUNTER — Ambulatory Visit (HOSPITAL_COMMUNITY): Payer: Medicare Other | Attending: Internal Medicine

## 2022-05-20 DIAGNOSIS — R0609 Other forms of dyspnea: Secondary | ICD-10-CM | POA: Insufficient documentation

## 2022-05-20 DIAGNOSIS — I421 Obstructive hypertrophic cardiomyopathy: Secondary | ICD-10-CM

## 2022-06-13 IMAGING — MR MR CARD MORPHOLOGY WO/W CM
45 of 48 series · 45 of 48 positions shown · IV contrast (Contrast agent)
Comparison: none

CLINICAL DATA: Clinical question of hypertrophic cardiomyopathy
Study assumes HCT of 35 and BSA of 1.76 m2.

EXAM:
CARDIAC MRI
TECHNIQUE: The patient was scanned on a 1.5 Tesla GE magnet. A dedicated
cardiac coil was used. Functional imaging was done using Fiesta
sequences. [DATE], and 4 chamber views were done to assess for RWMA's.
Modified Eboni rule using a short axis stack was used to
calculate an ejection fraction on a dedicated work station using
Circle software. The patient received 10 cc of Gadavist. After 10
minutes inversion recovery sequences were used to assess for
infiltration and scar tissue.
CONTRAST:  10 cc  of Gadavist

[Series 4: t2_haste_db_tra_bh · axial · 8.0mm · 1.41mm/px · 1 of 17 slices shown]
[im 1/17]
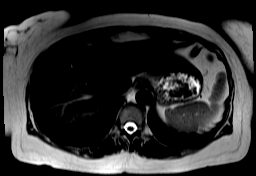

[Series 8: bSSFP · oblique · 8.0mm · 1.61mm/px · 1 of 25 slices shown (1 of 16)]
[im 1/25]
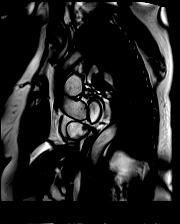

[Series 9: bSSFP · oblique · 8.0mm · 1.61mm/px · 1 of 25 slices shown (2 of 16)]
[im 1/25]
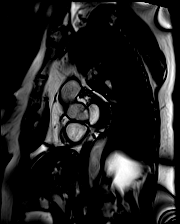

[Series 10: bSSFP · oblique · 8.0mm · 1.61mm/px · 1 of 25 slices shown (3 of 16)]
[im 1/25]
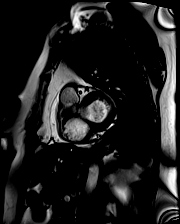

[Series 11: bSSFP · oblique · 8.0mm · 1.61mm/px · 1 of 25 slices shown (4 of 16)]
[im 1/25]
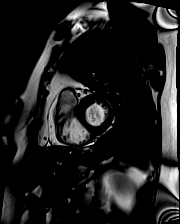

[Series 12: bSSFP · oblique · 8.0mm · 1.61mm/px · 1 of 25 slices shown (5 of 16)]
[im 1/25]
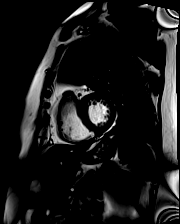

[Series 13: bSSFP · oblique · 8.0mm · 1.61mm/px · 1 of 25 slices shown (6 of 16)]
[im 1/25]
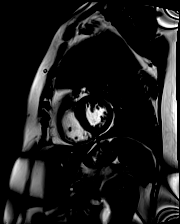

[Series 14: bSSFP · oblique · 8.0mm · 1.61mm/px · 1 of 25 slices shown (7 of 16)]
[im 1/25]
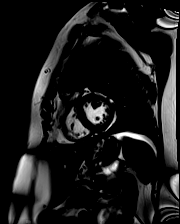

[Series 15: bSSFP · oblique · 8.0mm · 1.61mm/px · 1 of 25 slices shown (8 of 16)]
[im 1/25]
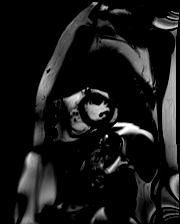

[Series 16: bSSFP · oblique · 8.0mm · 1.61mm/px · 1 of 25 slices shown (9 of 16)]
[im 1/25]
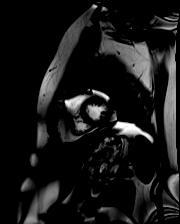

[Series 17: bSSFP · oblique · 8.0mm · 1.61mm/px · 1 of 25 slices shown (10 of 16)]
[im 1/25]
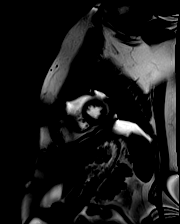

[Series 18: bSSFP · oblique · 8.0mm · 1.61mm/px · 1 of 25 slices shown (11 of 16)]
[im 1/25]
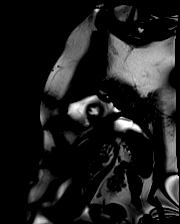

[Series 19: bSSFP · oblique · 8.0mm · 1.61mm/px · 1 of 25 slices shown (12 of 16)]
[im 1/25]
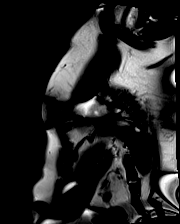

[Series 20: bSSFP · oblique · 8.0mm · 1.61mm/px · 1 of 25 slices shown (13 of 16)]
[im 1/25]
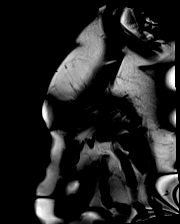

[Series 21: bSSFP · axial · 6.0mm · 1.41mm/px · 1 of 25 slices shown (14 of 16)]
[im 1/25]
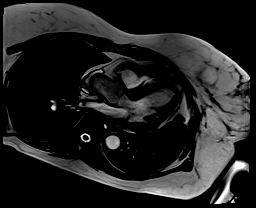

[Series 22: bSSFP · oblique · 6.0mm · 1.41mm/px · 1 of 25 slices shown (15 of 16)]
[im 1/25]
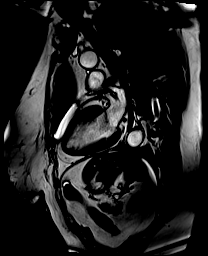

[Series 23: bSSFP · axial · 6.0mm · 1.41mm/px · 1 of 25 slices shown (16 of 16)]
[im 1/25]
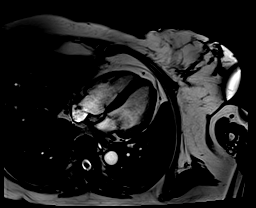

[Series 24: (id)_long_t1 · oblique · 8.0mm · 1.56mm/px · 1 of 24 slices shown]
[im 1/24]
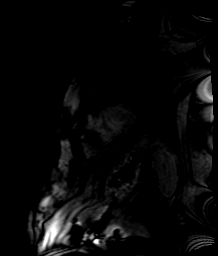

[Series 25: (id)_long_t1_moco · oblique · 8.0mm · 1.56mm/px · 1 of 24 slices shown]
[im 1/24]
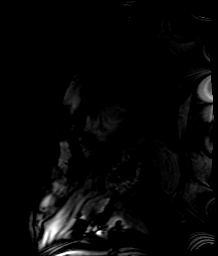

[Series 26: (id)_long_t1_moco_t1 · 1 of 3 slices shown (1 of 2)]
[im 1/3]
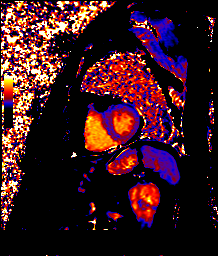

[Series 26: (id)_long_t1_moco_t1 · oblique · 8.0mm · 1.56mm/px · 1 of 3 slices shown (2 of 2)]
[im 1/3]
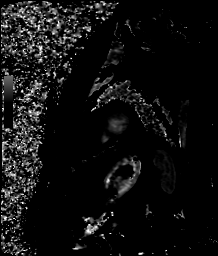

[Series 28: (id)_trufi · oblique · 8.0mm · 2.08mm/px · 1 of 9 slices shown]
[im 1/9]
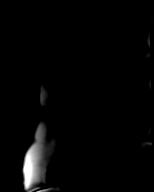

[Series 29: (id)_trufi_moco · oblique · 8.0mm · 2.08mm/px · 1 of 9 slices shown]
[im 1/9]
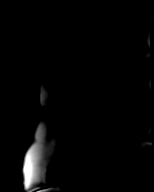

[Series 30: (id)_trufi_moco_t2 · oblique · 8.0mm · 2.08mm/px · 1 of 3 slices shown]
[im 1/3]
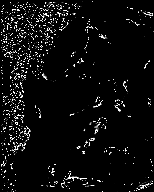

[Series 32: STIR · oblique · 8.0mm · 1.92mm/px · 1 of 12 slices shown]
[im 1/12]
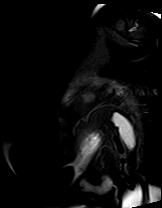

[Series 33: cine_trufi_short axis_cs_2_shot · oblique · 8.0mm · 1.48mm/px · 1 of 25 slices shown (1 of 13)]
[im 1/25]
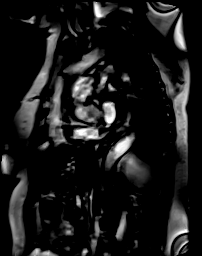

[Series 33: cine_trufi_short axis_cs_2_shot · oblique · 8.0mm · 1.48mm/px · 1 of 25 slices shown (2 of 13)]
[im 1/25]
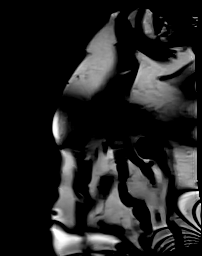

[Series 33: cine_trufi_short axis_cs_2_shot · oblique · 8.0mm · 1.48mm/px · 1 of 25 slices shown (3 of 13)]
[im 1/25]
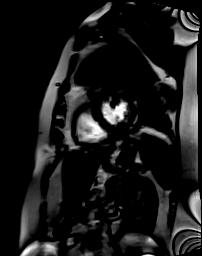

[Series 33: cine_trufi_short axis_cs_2_shot · oblique · 8.0mm · 1.48mm/px · 1 of 25 slices shown (4 of 13)]
[im 1/25]
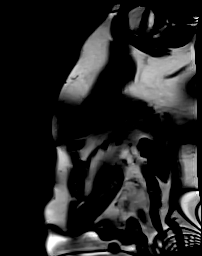

[Series 33: cine_trufi_short axis_cs_2_shot · oblique · 8.0mm · 1.48mm/px · 1 of 25 slices shown (5 of 13)]
[im 1/25]
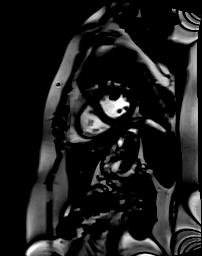

[Series 33: cine_trufi_short axis_cs_2_shot · oblique · 8.0mm · 1.48mm/px · 1 of 25 slices shown (6 of 13)]
[im 1/25]
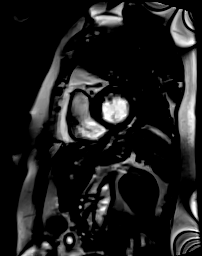

[Series 33: cine_trufi_short axis_cs_2_shot · oblique · 8.0mm · 1.48mm/px · 1 of 25 slices shown (7 of 13)]
[im 1/25]
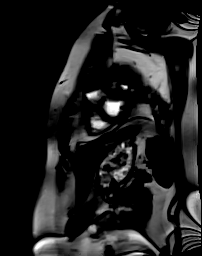

[Series 33: cine_trufi_short axis_cs_2_shot · oblique · 8.0mm · 1.48mm/px · 1 of 25 slices shown (8 of 13)]
[im 1/25]
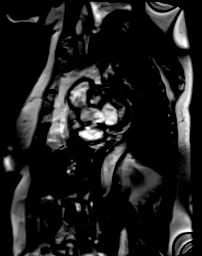

[Series 33: cine_trufi_short axis_cs_2_shot · oblique · 8.0mm · 1.48mm/px · 1 of 25 slices shown (9 of 13)]
[im 1/25]
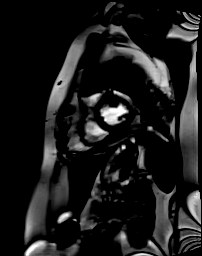

[Series 33: cine_trufi_short axis_cs_2_shot · oblique · 8.0mm · 1.48mm/px · 1 of 25 slices shown (10 of 13)]
[im 1/25]
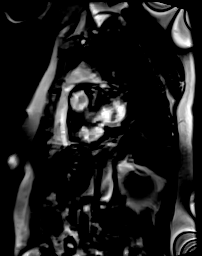

[Series 33: cine_trufi_short axis_cs_2_shot · oblique · 8.0mm · 1.48mm/px · 1 of 25 slices shown (11 of 13)]
[im 1/25]
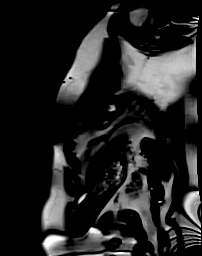

[Series 33: cine_trufi_short axis_cs_2_shot · oblique · 8.0mm · 1.48mm/px · 1 of 25 slices shown (12 of 13)]
[im 1/25]
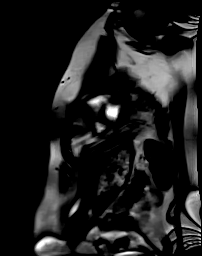

[Series 33: cine_trufi_short axis_cs_2_shot · oblique · 8.0mm · 1.48mm/px · 1 of 25 slices shown (13 of 13)]
[im 1/25]
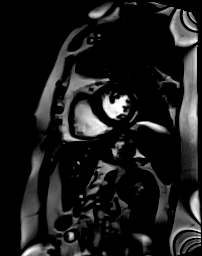

[Series 34: pre short axis · oblique · non-contrast · 8.0mm · 2.50mm/px · 1 of 10 slices shown (1 of 6)]
[im 1/10]
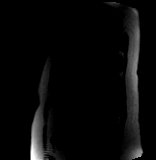

[Series 35: pre short axis · oblique · non-contrast · 8.0mm · 2.50mm/px · 1 of 10 slices shown (2 of 6)]
[im 1/10]
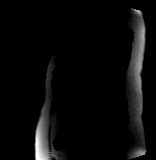

[Series 36: pre short axis · oblique · non-contrast · 8.0mm · 2.50mm/px · 1 of 10 slices shown (3 of 6)]
[im 1/10]
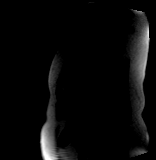

[Series 37: pre short axis · oblique · non-contrast · 8.0mm · 2.50mm/px · 1 of 10 slices shown (4 of 6)]
[im 1/10]
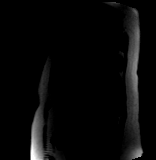

[Series 38: pre short axis · oblique · non-contrast · 8.0mm · 2.50mm/px · 1 of 10 slices shown (5 of 6)]
[im 1/10]
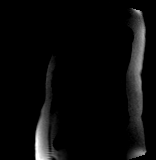

[Series 39: pre short axis · oblique · non-contrast · 8.0mm · 2.50mm/px · 1 of 10 slices shown (6 of 6)]
[im 1/10]
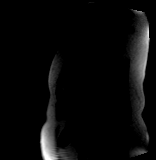

[Series 40: rest short axis · oblique · 8.0mm · 2.50mm/px · 1 of 60 slices shown]
[im 1/60]
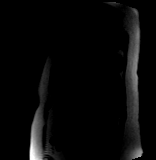

[45 of 48 positions shown; findings below may reference images not displayed]

FINDINGS: 1. Normal left ventricular size, with LVEDD 43 mm, and LVEDVi 49
mL/m2.

Asymmetric sepal remodeling, with intraventricular septal thickness
of 15 mm, posterior wall thickness of 7 mm, and myocardial mass
index of 53 g/m2.

Normal left ventricular systolic function (LVEF =66%). There are no
regional wall motion abnormalities. There is no systolic anterior
motion of the anterior mitral valve.

Apical displacement of the anterior papillary muscle ventricular
insertion.

ECV signal is mildly elevated (33% in the apex, 38% mid septum, 34%
mid inferior).

Left ventricular parametric mapping notable for normal T2 mapping.

There is no late gadolinium enhancement in the left ventricular
myocardium. Six standard deviation reference used.

2. Normal right ventricular size with RVEDVI 48 mL/m2.

Normal right ventricular thickness.

Normal right ventricular systolic function (RVEF =57%). There are no
regional wall motion abnormalities or aneurysms.

3.  Normal left and right atrial size.

4. Normal size of the aortic root, ascending aorta and pulmonary
artery.

5. Valve assessment:

Aortic Valve: Tri-leaflet aortic valve. Qualitatively no significant
regurgitation.

Pulmonic Valve: Qualitatively no significant regurgitation.

Tricuspid Valve:Qualitatively no significant regurgitation.

Mitral Valve: Mild mitral regurgitation.

6.  Normal pericardium.  No pericardial effusion.

7. Grossly, no extracardiac findings. Recommended dedicated study if
concerned for non-cardiac pathology.
IMPRESSION: Imaging criteria for hypertrophic cardiomyopathy met.

There is no evidence of an LVOT obstruction on supine imaging.

There is no evidence of late gadolinium enhancement.

## 2022-06-25 ENCOUNTER — Ambulatory Visit: Payer: Medicare Other | Admitting: Interventional Cardiology

## 2022-06-29 NOTE — Progress Notes (Signed)
Cardiology Office Note:    Date:  06/30/2022   ID:  Sheri Arellano, DOB December 01, 1951, MRN [id number]  PCP:  Josetta Huddle, MD   [facility] Providers Cardiologist:  Larae Grooms, MD     Referring MD: Josetta Huddle, MD   CC:  HOCM  History of Present Illness:    Sheri Arellano is a 71 y.o. female with a hx of SVT ablation (AVNRT- Lovena Le 2016), hx of DCIS with taxane based chemotherapy and 60 Gy of chest radiation,  with SOB found to have HOCM Seen 01/01/22.In interval has improvement in gradients on BB.  And did well with CPET.  Gave exercise prescription at beginning of the year.  Saw genetics but insurance would not cover genetic testing; no testing for kids largely with insurance issues.  Patient notes no SOB at rest and no DOE Minimal exercise presently. Notes rare fatigue. Notes no palpitations Notes no CP. Notes no dizziness. Notes no syncope.  Notes worsening arthritis that's limited her mobility.  Past Medical History:  Diagnosis Date   Anxiety    probable panic attacks   Cancer ([place])    breast   Dysphagia    Esophageal dysmotility    GERD (gastroesophageal reflux disease)    H/O hiatal hernia    Hyperlipidemia    Insomnia    Iron deficiency anemia    hx   Other malaise and fatigue    Radiation 06/05/14-07/18/14   left breast 50.4 gray, lumpectomy cavity boosted to 60.4 gray   SVT (supraventricular tachycardia) (HCC)    documented-takes metoprolol as needed   Vitamin D deficiency     Past Surgical History:  Procedure Laterality Date   CARPAL TUNNEL RELEASE     lt and rt   CHOLECYSTECTOMY  1999   COLONOSCOPY WITH PROPOFOL N/A 03/20/2015   Procedure: COLONOSCOPY WITH PROPOFOL;  Surgeon: Garlan Fair, MD;  Location: [facility];  Service: Endoscopy;  Laterality: N/A;   ELECTROPHYSIOLOGIC STUDY N/A 05/30/2015   Procedure: SVT Ablation;  Surgeon: Evans Lance, MD;  Location: [place] [facility];  Service: Cardiovascular;  Laterality: N/A;    PARTIAL MASTECTOMY WITH NEEDLE LOCALIZATION AND AXILLARY SENTINEL LYMPH NODE BX Left 01/16/2014   Procedure: PARTIAL MASTECTOMY WITH NEEDLE LOCALIZATION AND AXILLARY SENTINEL LYMPH NODE BIOPSY;  Surgeon: [name], MD;  Location: [place];  Service: General;  Laterality: Left;   PORT-A-CATH REMOVAL Right 08/23/2014   Procedure: MINOR REMOVAL PORT-A-CATH;  Surgeon: Fanny Skates, MD;  Location: [place];  Service: General;  Laterality: Right;   PORTACATH PLACEMENT Right 02/28/2014   Procedure: INSERTION PORT-A-CATH;  Surgeon: [name], MD;  Location: [place];  Service: General;  Laterality: Right;   RIGHT/LEFT HEART CATH AND CORONARY ANGIOGRAPHY N/A 01/28/2022   Procedure: RIGHT/LEFT HEART CATH AND CORONARY ANGIOGRAPHY;  Surgeon: Jettie Booze, MD;  Location: [place] [facility];  Service: Cardiovascular;  Laterality: N/A;    Current Medications: Current Meds  Medication Sig   acetaminophen-codeine (TYLENOL #3) 300-30 MG tablet Take 1 tablet by mouth every 8 (eight) hours as needed for moderate pain.   aspirin EC 81 MG tablet Take 81 mg by mouth daily.   Brimonidine Tartrate (LUMIFY) 0.025 % SOLN Place 1 drop into both eyes 2 (two) times daily as needed (dry eyes).   Cholecalciferol (VITAMIN D) 2000 UNITS tablet Take 2,000 Units by mouth daily.   clonazePAM (KLONOPIN) 1 MG tablet Take 1 mg by mouth at bedtime.  doxycycline (VIBRAMYCIN) 100 MG capsule Take 100 mg by mouth daily. Daily for Rosacea   fluorouracil (EFUDEX) 5 % cream as directed. Skin cancer   furosemide (LASIX) 40 MG tablet Take 2 tablets (80 mg total) by mouth daily.   loratadine (CLARITIN) 10 MG tablet Take 10 mg by mouth every evening.    Magnesium 250 MG TABS Take 250 mg by mouth in the morning and at bedtime.   meloxicam (MOBIC) 7.5 MG tablet Take 1 tablet (7.5 mg total) by mouth 2 (two) times daily as needed for up to 14 doses for pain.   metoprolol succinate (TOPROL-XL) 50  MG 24 hr tablet Take 1.5 tablets (75 mg total) by mouth daily. Take with or immediately following a meal.   nitroGLYCERIN (NITROSTAT) 0.4 MG SL tablet Place 1 tablet (0.4 mg total) under the tongue every 5 (five) minutes as needed (Esophageal spasms).   pantoprazole (PROTONIX) 40 MG tablet Take 40 mg by mouth daily.   potassium chloride SA (KLOR-CON M) 20 MEQ tablet Take 3 tablets (60 mEq total) by mouth daily.   rosuvastatin (CRESTOR) 10 MG tablet Take 10 mg by mouth at bedtime.   sertraline (ZOLOFT) 100 MG tablet Take 100 mg by mouth at bedtime.   Sunscreen SPF50 LOTN as directed. Skin cancer   traMADol (ULTRAM) 50 MG tablet Take 1 tablet (50 mg total) by mouth 2 (two) times daily as needed.   tretinoin (RETIN-A) 0.025 % cream as directed. Skin cancer   valACYclovir (VALTREX) 1000 MG tablet Take 1 g by mouth daily as needed (fever blisters).      Allergies:   Erythromycin base, Macrolides and ketolides, and Valsartan-hydrochlorothiazide   Social History   Socioeconomic History   Marital status: Married    Spouse name: Not on file   Number of children: 2   Years of education: Not on file   Highest education level: Not on file  Occupational History   Occupation: realtor    Employer: [employer]  Tobacco Use   Smoking status: Former    Packs/day: 1.50    Years: 8.00    Total pack years: 12.00    Types: Cigarettes    Quit date: 01/22/1976    Years since quitting: 46.4   Smokeless tobacco: Never  Vaping Use   Vaping Use: Never used  Substance and Sexual Activity   Alcohol use: Yes    Alcohol/week: 7.0 standard drinks of alcohol    Types: 7 Shots of liquor per week    Comment: 1 vodka per day   Drug use: No   Sexual activity: Yes    Birth control/protection: Post-menopausal  Other Topics Concern   Not on file  Social History Narrative   Not on file   Social Determinants of Health   Financial Resource Strain: Not on file  Food Insecurity: Not on file   Transportation Needs: Not on file  Physical Activity: Not on file  Stress: Not on file  Social Connections: Not on file    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 There is no history of Colon cancer or CAD. History of coronary artery disease notable for CAD female. History of heart failure notable for CHF mother. History of arrhythmia notable  for atrial fibrillation mother. Denies family history of sudden cardiac death including drowning, car accidents, or unexplained deaths in the family. No history of cardiomyopathies including hypertrophic cardiomyopathy, left ventricular non-compaction, or arrhythmogenic right ventricular cardiomyopathy.   ROS:   Please see the history of present illness.     All other systems reviewed and are negative.  EKGs/Labs/Other Studies Reviewed:    The following studies were reviewed today:  EKG:   12/12/21: SR rate 84 with LVH  Transthoracic Echocardiogram: Date:12/18/21 Results:  1. Left ventricular ejection fraction, by estimation, is 60 to 65%. The  left ventricle has normal function. The left ventricle has no regional  wall motion abnormalities. Moderate asymmetric septal left ventricular  hypertrophy. Mitral valve systolic  anterior motion noted. There was no significant LV outflow gradient  measured at rest but with valsalva there appeared to be a LV outflow  gradient up to 94 mmHg peak. Left ventricular diastolic parameters are  consistent with Grade I diastolic dysfunction  (impaired relaxation).   2. Right ventricular systolic function is normal. The right ventricular  size is normal. Tricuspid regurgitation signal is inadequate for assessing  PA pressure.    3. The aortic valve is tricuspid. Aortic valve regurgitation is not  visualized. Aortic valve sclerosis/calcification is present, without any  evidence of aortic stenosis.   4. The mitral valve is abnormal, there was mitral valve SAM noted as  above. Trivial mitral valve regurgitation. No evidence of mitral stenosis.   5. The inferior vena cava is normal in size with greater than 50%  respiratory variability, suggesting right atrial pressure of 3 mmHg.   6. Possible hypertrophic cardiomyopathy with moderate asymmetric septal  hypertrophy, mitral valve SAM, and LVOT gradient that appears to be up to  94 mmHg peak with valsalva though no significant gradient measured at  rest.    Recent Labs: 12/26/2021: Magnesium 2.0; NT-Pro BNP 402 01/23/2022: BUN 8; Creatinine, Ser 0.62; Platelets 285 01/28/2022: Hemoglobin 11.6; Hemoglobin 11.9; Potassium 3.8; Potassium 3.8; Sodium 132; Sodium 133  Recent Lipid Panel No results found for: "CHOL", "TRIG", "HDL", "CHOLHDL", "VLDL", "LDLCALC", "LDLDIRECT"   Physical Exam:    VS:  BP 128/82   Pulse 65   Ht '5\' 2"'$  (1.575 m)   Wt 163 lb 9.6 oz (74.2 kg)   SpO2 97%   BMI 29.92 kg/m     Wt Readings from Last 3 Encounters:  06/30/22 163 lb 9.6 oz (74.2 kg)  02/10/22 161 lb (73 kg)  01/28/22 155 lb (70.3 kg)    Gen: no distress, obese/well nourished/malnourished   Neck: No JVD,  Cardiac: No Rubs or Gallops, harsh , regular rhythm, +2 radial pulses Respiratory: Clear to auscultation bilaterally, normal effort, normal  respiratory rate GI: Soft, nontender, non-distended  MS: No  edema;  moves all extremities Integument: Skin feels warm Neuro:  At time of evaluation, alert and oriented to person/place/time/situation  Psych: Normal affect, patient feels a bit anxious  ASSESSMENT:    1. Hypertrophic cardiomyopathy (HCC)     PLAN:    Hypertrophic Cardiomyopathy - unable to afford genetic testing -  peak gradient 90 on Valsalva  - with  associated MR and LVOT (inducible) - Family history not clear, discussed 1st degree family history screening, saw genetics, will get kids screened in 5 year echos due to insurance questions about their long term insurance - NYHA I-II  - CMR with no scar and 15 mm thickness - absence of atrial fibrillation -  2 year  assessment for VT on rhythm monitor will be done at next visit, presently low risk for SCD (14 day non live ziopatch - on metoprolol to 50 mg PO daily - if worsening symptoms, we have talked about potentially starting  mavacamten; she would need to change to a different SSRI given potential medication interactions and we would do a fasting PM echo prior to starting and my cut her diuertic further     Medication Adjustments/Labs and Tests Ordered: Current medicines are reviewed at length with the patient today.  Concerns regarding medicines are outlined above.  No orders of the defined types were placed in this encounter.  No orders of the defined types were placed in this encounter.   Patient Instructions  Medication Instructions:  Your physician recommends that you continue on your current medications as directed. Please refer to the Current Medication list given to you today.  *If you need a refill on your cardiac medications before your next appointment, please call your pharmacy*   Lab Work: NONE If you have labs (blood work) drawn today and your tests are completely normal, you will receive your results only by: [place] (if you have MyChart) OR A paper copy in the mail If you have any lab test that is abnormal or we need to change your treatment, we will call you to review the results.   Testing/Procedures: JAN 2024- Your physician has requested that you have an echocardiogram. Echocardiography is a painless test that uses sound waves to create images of your heart. It provides your doctor with information about the size and shape of your heart and how well  your heart's chambers and valves are working. This procedure takes approximately one hour. There are no restrictions for this procedure.   Your physician has requested that you wear a 14 day heart monitor.    Follow-Up: At [facility], you and your health needs are our priority.  As part of our continuing mission to provide you with exceptional heart care, we have created designated Provider Care Teams.  These Care Teams include your primary Cardiologist (physician) and Advanced Practice Providers (APPs -  Physician Assistants and Nurse Practitioners) who all work together to provide you with the care you need, when you need it.  We recommend signing up for the patient portal called "MyChart".  Sign up information is provided on this After Visit Summary.  MyChart is used to connect with patients for Virtual Visits (Telemedicine).  Patients are able to view lab/test results, encounter notes, upcoming appointments, etc.  Non-urgent messages can be sent to your provider as well.   To learn more about what you can do with MyChart, go to [email or web address].    Your next appointment:   6 month(s) after Jan Echo   The format for your next appointment:   In Person  Provider:   Rudean Haskell, MD   Other Instructions [place] Monitor Instructions  Your physician has requested you wear a ZIO patch monitor for 14 days.  This is a single patch monitor. [employer] supplies one patch monitor per enrollment. Additional stickers are not available. Please do not apply patch if you will be having a Nuclear Stress Test,  Echocardiogram, Cardiac CT, MRI, or Chest Xray during the period you would be wearing the  monitor. The patch cannot be worn during these tests. You cannot remove and re-apply the  ZIO XT patch monitor.  Your ZIO patch monitor will be mailed 3 day USPS to your  address on file. It may take 3-5 days  to receive your monitor after you have been enrolled.  Once you have  received your monitor, please review the enclosed instructions. Your monitor  has already been registered assigning a specific monitor serial # to you.  Billing and Patient Assistance Program Information  We have supplied [employer] with any of your insurance information on file for billing purposes. [employer] offers a sliding scale Patient Assistance Program for patients that do not have  insurance, or whose insurance does not completely cover the cost of the ZIO monitor.  You must apply for the Patient Assistance Program to qualify for this discounted rate.  To apply, please call [employer] at [phone], select option 4, select option 2, ask to apply for  Patient Assistance Program. Theodore Demark will ask your household income, and how many people  are in your household. They will quote your out-of-pocket cost based on that information.  [employer] will also be able to set up a 44-month interest-free payment plan if needed.  Applying the monitor   Shave hair from upper left chest.  Hold abrader disc by orange tab. Rub abrader in 40 strokes over the upper left chest as  indicated in your monitor instructions.  Clean area with 4 enclosed alcohol pads. Let dry.  Apply patch as indicated in monitor instructions. Patch will be placed under collarbone on left  side of chest with arrow pointing upward.  Rub patch adhesive wings for 2 minutes. Remove white label marked "1". Remove the white  label marked "2". Rub patch adhesive wings for 2 additional minutes.  While looking in a mirror, press and release button in center of patch. A small green light will  flash 3-4 times. This will be your only indicator that the monitor has been turned on.  Do not shower for the first 24 hours. You may shower after the first 24 hours.  Press the button if you feel a symptom. You will hear a small click. Record Date, Time and  Symptom in the Patient Logbook.  When you are ready to remove the patch, follow instructions on  the last 2 pages of Patient  Logbook. Stick patch monitor onto the last page of Patient Logbook.  Place Patient Logbook in the blue and white box. Use locking tab on box and tape box closed  securely. The blue and white box has prepaid postage on it. Please place it in the mailbox as  soon as possible. Your physician should have your test results approximately 7 days after the  monitor has been [facility]  Call [place]at [phone] you have questions regarding  your ZIO XT patch monitor. Call them immediately if you see an orange light blinking on your  monitor.  If your monitor falls off in less than 4 days, contact our Monitor department at [phone]  If your monitor becomes loose or falls off after 4 days call [employer] at [phone]  suggestions on securing your monitor   Important Information About Sugar         Signed, MWerner Lean MD  06/30/2022 4:39 PM    [place]

## 2022-06-30 ENCOUNTER — Ambulatory Visit: Payer: Medicare Other | Admitting: Internal Medicine

## 2022-06-30 ENCOUNTER — Other Ambulatory Visit: Payer: Self-pay | Admitting: Internal Medicine

## 2022-06-30 ENCOUNTER — Ambulatory Visit (INDEPENDENT_AMBULATORY_CARE_PROVIDER_SITE_OTHER): Payer: Medicare Other

## 2022-06-30 ENCOUNTER — Encounter: Payer: Self-pay | Admitting: Internal Medicine

## 2022-06-30 VITALS — BP 128/82 | HR 65 | Ht 62.0 in | Wt 163.6 lb

## 2022-06-30 DIAGNOSIS — R001 Bradycardia, unspecified: Secondary | ICD-10-CM | POA: Diagnosis not present

## 2022-06-30 DIAGNOSIS — I493 Ventricular premature depolarization: Secondary | ICD-10-CM

## 2022-06-30 DIAGNOSIS — I422 Other hypertrophic cardiomyopathy: Secondary | ICD-10-CM | POA: Diagnosis not present

## 2022-06-30 DIAGNOSIS — I421 Obstructive hypertrophic cardiomyopathy: Secondary | ICD-10-CM | POA: Insufficient documentation

## 2022-06-30 NOTE — Progress Notes (Unsigned)
Enrolled for [employer] to mail a ZIO XT long term holter monitor to the patients address on file.  Patient to wear before 2024 visit.  Will schedule delivery 12/08/22

## 2022-06-30 NOTE — Patient Instructions (Addendum)
Medication Instructions:  Your physician recommends that you continue on your current medications as directed. Please refer to the Current Medication list given to you today.  *If you need a refill on your cardiac medications before your next appointment, please call your pharmacy*   Lab Work: NONE If you have labs (blood work) drawn today and your tests are completely normal, you will receive your results only by: [place] (if you have MyChart) OR A paper copy in the mail If you have any lab test that is abnormal or we need to change your treatment, we will call you to review the results.   Testing/Procedures: JAN 2024- Your physician has requested that you have an echocardiogram. Echocardiography is a painless test that uses sound waves to create images of your heart. It provides your doctor with information about the size and shape of your heart and how well your heart's chambers and valves are working. This procedure takes approximately one hour. There are no restrictions for this procedure.   Your physician has requested that you wear a 14 day heart monitor.    Follow-Up: At [facility], you and your health needs are our priority.  As part of our continuing mission to provide you with exceptional heart care, we have created designated Provider Care Teams.  These Care Teams include your primary Cardiologist (physician) and Advanced Practice Providers (APPs -  Physician Assistants and Nurse Practitioners) who all work together to provide you with the care you need, when you need it.  We recommend signing up for the patient portal called "MyChart".  Sign up information is provided on this After Visit Summary.  MyChart is used to connect with patients for Virtual Visits (Telemedicine).  Patients are able to view lab/test results, encounter notes, upcoming appointments, etc.  Non-urgent messages can be sent to your provider as well.   To learn more about what you can do with  MyChart, go to [email or web address].    Your next appointment:   6 month(s) after Jan Echo   The format for your next appointment:   In Person  Provider:   Rudean Haskell, MD   Other Instructions [place] Monitor Instructions  Your physician has requested you wear a ZIO patch monitor for 14 days.  This is a single patch monitor. [employer] supplies one patch monitor per enrollment. Additional stickers are not available. Please do not apply patch if you will be having a Nuclear Stress Test,  Echocardiogram, Cardiac CT, MRI, or Chest Xray during the period you would be wearing the  monitor. The patch cannot be worn during these tests. You cannot remove and re-apply the  ZIO XT patch monitor.  Your ZIO patch monitor will be mailed 3 day USPS to your address on file. It may take 3-5 days  to receive your monitor after you have been enrolled.  Once you have received your monitor, please review the enclosed instructions. Your monitor  has already been registered assigning a specific monitor serial # to you.  Billing and Patient Assistance Program Information  We have supplied [employer] with any of your insurance information on file for billing purposes. [employer] offers a sliding scale Patient Assistance Program for patients that do not have  insurance, or whose insurance does not completely cover the cost of the ZIO monitor.  You must apply for the Patient Assistance Program to qualify for this discounted rate.  To apply, please call [employer] at [phone], select option 4, select option 2, ask  to apply for  Patient Assistance Program. Theodore Demark will ask your household income, and how many people  are in your household. They will quote your out-of-pocket cost based on that information.  [employer] will also be able to set up a 87-month interest-free payment plan if needed.  Applying the monitor   Shave hair from upper left chest.  Hold abrader disc by orange tab. Rub  abrader in 40 strokes over the upper left chest as  indicated in your monitor instructions.  Clean area with 4 enclosed alcohol pads. Let dry.  Apply patch as indicated in monitor instructions. Patch will be placed under collarbone on left  side of chest with arrow pointing upward.  Rub patch adhesive wings for 2 minutes. Remove white label marked "1". Remove the white  label marked "2". Rub patch adhesive wings for 2 additional minutes.  While looking in a mirror, press and release button in center of patch. A small green light will  flash 3-4 times. This will be your only indicator that the monitor has been turned on.  Do not shower for the first 24 hours. You may shower after the first 24 hours.  Press the button if you feel a symptom. You will hear a small click. Record Date, Time and  Symptom in the Patient Logbook.  When you are ready to remove the patch, follow instructions on the last 2 pages of Patient  Logbook. Stick patch monitor onto the last page of Patient Logbook.  Place Patient Logbook in the blue and white box. Use locking tab on box and tape box closed  securely. The blue and white box has prepaid postage on it. Please place it in the mailbox as  soon as possible. Your physician should have your test results approximately 7 days after the  monitor has been [facility]  Call IS[place]at [phone] you have questions regarding  your ZIO XT patch monitor. Call them immediately if you see an orange light blinking on your  monitor.  If your monitor falls off in less than 4 days, contact our Monitor department at [phone]  If your monitor becomes loose or falls off after 4 days call [employer] at [phone]  suggestions on securing your monitor   Important Information About Sugar

## 2022-07-01 ENCOUNTER — Ambulatory Visit: Payer: Medicare Other

## 2022-07-01 NOTE — Addendum Note (Signed)
Addended by: Precious Gilding on: 07/01/2022 11:13 AM   Modules accepted: Orders

## 2022-07-23 ENCOUNTER — Other Ambulatory Visit: Payer: Self-pay | Admitting: Interventional Cardiology

## 2022-08-01 ENCOUNTER — Encounter: Payer: Self-pay | Admitting: Interventional Cardiology

## 2022-11-17 ENCOUNTER — Encounter: Payer: Self-pay | Admitting: Internal Medicine

## 2022-12-03 ENCOUNTER — Other Ambulatory Visit: Payer: Self-pay

## 2022-12-03 MED ORDER — POTASSIUM CHLORIDE CRYS ER 20 MEQ PO TBCR: 60.0000 meq | EXTENDED_RELEASE_TABLET | Freq: Every day | ORAL | 3 refills | Status: DC

## 2022-12-07 NOTE — Progress Notes (Unsigned)
Office Visit    Patient Name: Sheri Arellano Date of Encounter: 12/07/2022  PCP:  Josetta Huddle, MD   [place]  Cardiologist:  Larae Grooms, MD  Advanced Practice Provider:  No care team member to display Electrophysiologist:  None   HPI    Sheri Arellano is a 71 y.o. female with past medical history significant for SVT ablation (AVNRT Lovena Le 2016), history of DCIS with taxane based chemotherapy and 60 GY of chest radiation, SOB found to have HOCM (12/2021) presents today for follow-up appointment.  She has had an improvement in gradients since on a beta-blocker.  She has done well with repeat CPET.  Exercise prescription was given at the beginning of this year.  She saw a genetic specialist but insurance would not cover genetic testing.  No testing for kids largely due to insurance issues.  When she was last seen 06/2022 she did not have any shortness of breath or DOE.  Rare fatigue.  No palpitations, chest pain, dizziness, or syncope.  Today, she***  Past Medical History    Past Medical History:  Diagnosis Date   Anxiety    probable panic attacks   Cancer ([place])    breast   Dysphagia    Esophageal dysmotility    GERD (gastroesophageal reflux disease)    H/O hiatal hernia    Hyperlipidemia    Insomnia    Iron deficiency anemia    hx   Other malaise and fatigue    Radiation 06/05/14-07/18/14   left breast 50.4 gray, lumpectomy cavity boosted to 60.4 gray   SVT (supraventricular tachycardia) (HCC)    documented-takes metoprolol as needed   Vitamin D deficiency    Past Surgical History:  Procedure Laterality Date   CARPAL TUNNEL RELEASE     lt and rt   CHOLECYSTECTOMY  1999   COLONOSCOPY WITH PROPOFOL N/A 03/20/2015   Procedure: COLONOSCOPY WITH PROPOFOL;  Surgeon: Garlan Fair, MD;  Location: [facility];  Service: Endoscopy;  Laterality: N/A;   ELECTROPHYSIOLOGIC STUDY N/A 05/30/2015   Procedure: SVT Ablation;  Surgeon: Evans Lance, MD;  Location: [place] [facility];  Service: Cardiovascular;  Laterality: N/A;   PARTIAL MASTECTOMY WITH NEEDLE LOCALIZATION AND AXILLARY SENTINEL LYMPH NODE BX Left 01/16/2014   Procedure: PARTIAL MASTECTOMY WITH NEEDLE LOCALIZATION AND AXILLARY SENTINEL LYMPH NODE BIOPSY;  Surgeon: [name], MD;  Location: [place];  Service: General;  Laterality: Left;   PORT-A-CATH REMOVAL Right 08/23/2014   Procedure: MINOR REMOVAL PORT-A-CATH;  Surgeon: Fanny Skates, MD;  Location: [place];  Service: General;  Laterality: Right;   PORTACATH PLACEMENT Right 02/28/2014   Procedure: INSERTION PORT-A-CATH;  Surgeon: [name], MD;  Location: [place];  Service: General;  Laterality: Right;   RIGHT/LEFT HEART CATH AND CORONARY ANGIOGRAPHY N/A 01/28/2022   Procedure: RIGHT/LEFT HEART CATH AND CORONARY ANGIOGRAPHY;  Surgeon: Jettie Booze, MD;  Location: [place] [facility];  Service: Cardiovascular;  Laterality: N/A;    Allergies  Allergies  Allergen Reactions   Erythromycin Base Other (See Comments)    Stomach ache   Macrolides And Ketolides     Other reaction(s): dyspepsia   Valsartan-Hydrochlorothiazide     Other reaction(s): possible intolerance with myalgias    EKGs/Labs/Other Studies Reviewed:   The following studies were reviewed today: EKG:   12/12/21: SR rate 84 with LVH   Transthoracic Echocardiogram: Date:12/18/21 Results:  1. Left ventricular ejection fraction, by  estimation, is 60 to 65%. The  left ventricle has normal function. The left ventricle has no regional  wall motion abnormalities. Moderate asymmetric septal left ventricular  hypertrophy. Mitral valve systolic  anterior motion noted. There was no significant LV outflow gradient  measured at rest but with valsalva there appeared to be a LV outflow  gradient up to 94 mmHg peak. Left ventricular diastolic parameters are  consistent with Grade I diastolic dysfunction   (impaired relaxation).   2. Right ventricular systolic function is normal. The right ventricular  size is normal. Tricuspid regurgitation signal is inadequate for assessing  PA pressure.   3. The aortic valve is tricuspid. Aortic valve regurgitation is not  visualized. Aortic valve sclerosis/calcification is present, without any  evidence of aortic stenosis.   4. The mitral valve is abnormal, there was mitral valve SAM noted as  above. Trivial mitral valve regurgitation. No evidence of mitral stenosis.   5. The inferior vena cava is normal in size with greater than 50%  respiratory variability, suggesting right atrial pressure of 3 mmHg.   6. Possible hypertrophic cardiomyopathy with moderate asymmetric septal  hypertrophy, mitral valve SAM, and LVOT gradient that appears to be up to  94 mmHg peak with valsalva though no significant gradient measured at  rest.       EKG:  EKG is *** ordered today.  The ekg ordered today demonstrates ***  Recent Labs: 12/26/2021: Magnesium 2.0; NT-Pro BNP 402 01/23/2022: BUN 8; Creatinine, Ser 0.62; Platelets 285 01/28/2022: Hemoglobin 11.6; Hemoglobin 11.9; Potassium 3.8; Potassium 3.8; Sodium 132; Sodium 133  Recent Lipid Panel No results found for: "CHOL", "TRIG", "HDL", "CHOLHDL", "VLDL", "LDLCALC", "LDLDIRECT"  Risk Assessment/Calculations:  {Does this patient have ATRIAL FIBRILLATION?:[id number]}  Home Medications   No outpatient medications have been marked as taking for the 12/09/22 encounter (Appointment) with Elgie Collard, PA-C.     Review of Systems   ***   All other systems reviewed and are otherwise negative except as noted above.  Physical Exam    VS:  There were no vitals taken for this visit. , BMI There is no height or weight on file to calculate BMI.  Wt Readings from Last 3 Encounters:  06/30/22 163 lb 9.6 oz (74.2 kg)  02/10/22 161 lb (73 kg)  01/28/22 155 lb (70.3 kg)     GEN: Well nourished, well developed, in no  acute distress. HEENT: normal. Neck: Supple, no JVD, carotid bruits, or masses. Cardiac: ***RRR, no murmurs, rubs, or gallops. No clubbing, cyanosis, edema.  ***Radials/PT 2+ and equal bilaterally.  Respiratory:  ***Respirations regular and unlabored, clear to auscultation bilaterally. GI: Soft, nontender, nondistended. MS: No deformity or atrophy. Skin: Warm and dry, no rash. Neuro:  Strength and sensation are intact. Psych: Normal affect.  Assessment & Plan    Hypertrophic cardiomyopathy -Unable to get genetic testing -NYHA I-II -CMR with no scar and 15 mm thickness -Absence of atrial fibrillation -Will need updated ZIO monitor for VT surveillance -Continue metoprolol 50 mg p.o. daily  No BP recorded.  {Refresh Note OR Click here to enter BP  :1}***      Disposition: Follow up {follow up:15908} with Larae Grooms, MD or APP.  Signed, Elgie Collard, PA-C 12/07/2022, 3:18 PM [facility]

## 2022-12-09 ENCOUNTER — Ambulatory Visit: Payer: BLUE CROSS/BLUE SHIELD | Admitting: Physician Assistant

## 2022-12-09 DIAGNOSIS — I421 Obstructive hypertrophic cardiomyopathy: Secondary | ICD-10-CM

## 2022-12-15 ENCOUNTER — Other Ambulatory Visit: Payer: Self-pay

## 2022-12-15 ENCOUNTER — Ambulatory Visit (HOSPITAL_COMMUNITY): Payer: Medicare Other | Attending: Internal Medicine

## 2022-12-15 DIAGNOSIS — I422 Other hypertrophic cardiomyopathy: Secondary | ICD-10-CM | POA: Diagnosis not present

## 2022-12-15 LAB — ECHOCARDIOGRAM COMPLETE
Area-P 1/2: 3.95 cm2
S' Lateral: 2.5 cm

## 2022-12-15 MED ORDER — POTASSIUM CHLORIDE CRYS ER 20 MEQ PO TBCR
60.0000 meq | EXTENDED_RELEASE_TABLET | Freq: Every day | ORAL | 3 refills | Status: DC
Start: 2022-12-15 — End: 2023-10-14

## 2022-12-15 MED ORDER — PERFLUTREN LIPID MICROSPHERE
1.0000 mL | INTRAVENOUS | Status: AC | PRN
  Administered 2022-12-15: 2 mL via INTRAVENOUS

## 2022-12-18 ENCOUNTER — Encounter: Payer: Self-pay | Admitting: Internal Medicine

## 2022-12-23 ENCOUNTER — Other Ambulatory Visit: Payer: Self-pay | Admitting: Internal Medicine

## 2023-01-09 ENCOUNTER — Ambulatory Visit: Payer: Medicare Other | Admitting: Internal Medicine

## 2023-01-16 DIAGNOSIS — I493 Ventricular premature depolarization: Secondary | ICD-10-CM | POA: Diagnosis not present

## 2023-01-16 DIAGNOSIS — R001 Bradycardia, unspecified: Secondary | ICD-10-CM | POA: Diagnosis not present

## 2023-01-16 DIAGNOSIS — I422 Other hypertrophic cardiomyopathy: Secondary | ICD-10-CM | POA: Diagnosis not present

## 2023-01-20 ENCOUNTER — Telehealth: Payer: Self-pay | Admitting: Internal Medicine

## 2023-01-20 NOTE — Telephone Encounter (Signed)
Follow up call to patient to advise we will update her once the monitor results are reviewed. Patient verbalized understanding and had no questions.

## 2023-01-20 NOTE — Telephone Encounter (Signed)
Patient would like the results to her echo and her monitor.

## 2023-01-20 NOTE — Telephone Encounter (Signed)
Patient called asking for the test result of her heart monitor. Will forward to MD and nurse.

## 2023-01-23 ENCOUNTER — Other Ambulatory Visit: Payer: Self-pay | Admitting: Interventional Cardiology

## 2023-01-23 ENCOUNTER — Ambulatory Visit: Payer: Medicare Other | Admitting: Internal Medicine

## 2023-02-05 DIAGNOSIS — K080 Exfoliation of teeth due to systemic causes: Secondary | ICD-10-CM | POA: Diagnosis not present

## 2023-03-13 ENCOUNTER — Encounter: Payer: Self-pay | Admitting: Internal Medicine

## 2023-03-16 ENCOUNTER — Ambulatory Visit: Payer: Medicare Other | Admitting: Internal Medicine

## 2023-03-17 ENCOUNTER — Encounter: Payer: Self-pay | Admitting: Internal Medicine

## 2023-03-17 DIAGNOSIS — Z1231 Encounter for screening mammogram for malignant neoplasm of breast: Secondary | ICD-10-CM | POA: Diagnosis not present

## 2023-03-18 DIAGNOSIS — Z0000 Encounter for general adult medical examination without abnormal findings: Secondary | ICD-10-CM | POA: Diagnosis not present

## 2023-03-18 DIAGNOSIS — I422 Other hypertrophic cardiomyopathy: Secondary | ICD-10-CM | POA: Diagnosis not present

## 2023-03-18 DIAGNOSIS — I700 Atherosclerosis of aorta: Secondary | ICD-10-CM | POA: Diagnosis not present

## 2023-03-18 DIAGNOSIS — R7989 Other specified abnormal findings of blood chemistry: Secondary | ICD-10-CM | POA: Diagnosis not present

## 2023-03-18 DIAGNOSIS — I10 Essential (primary) hypertension: Secondary | ICD-10-CM | POA: Diagnosis not present

## 2023-03-18 DIAGNOSIS — D509 Iron deficiency anemia, unspecified: Secondary | ICD-10-CM | POA: Diagnosis not present

## 2023-03-18 DIAGNOSIS — E782 Mixed hyperlipidemia: Secondary | ICD-10-CM | POA: Diagnosis not present

## 2023-03-18 DIAGNOSIS — F331 Major depressive disorder, recurrent, moderate: Secondary | ICD-10-CM | POA: Diagnosis not present

## 2023-03-18 DIAGNOSIS — R946 Abnormal results of thyroid function studies: Secondary | ICD-10-CM | POA: Diagnosis not present

## 2023-03-18 DIAGNOSIS — E559 Vitamin D deficiency, unspecified: Secondary | ICD-10-CM | POA: Diagnosis not present

## 2023-03-26 ENCOUNTER — Ambulatory Visit: Payer: Medicare Other | Admitting: Internal Medicine

## 2023-03-30 DIAGNOSIS — K080 Exfoliation of teeth due to systemic causes: Secondary | ICD-10-CM | POA: Diagnosis not present

## 2023-04-01 DIAGNOSIS — Z87898 Personal history of other specified conditions: Secondary | ICD-10-CM | POA: Diagnosis not present

## 2023-04-01 DIAGNOSIS — N819 Female genital prolapse, unspecified: Secondary | ICD-10-CM | POA: Diagnosis not present

## 2023-04-02 DIAGNOSIS — K080 Exfoliation of teeth due to systemic causes: Secondary | ICD-10-CM | POA: Diagnosis not present

## 2023-04-13 DIAGNOSIS — R293 Abnormal posture: Secondary | ICD-10-CM | POA: Diagnosis not present

## 2023-04-13 DIAGNOSIS — N816 Rectocele: Secondary | ICD-10-CM | POA: Diagnosis not present

## 2023-04-13 DIAGNOSIS — H938X3 Other specified disorders of ear, bilateral: Secondary | ICD-10-CM | POA: Diagnosis not present

## 2023-04-15 DIAGNOSIS — H6591 Unspecified nonsuppurative otitis media, right ear: Secondary | ICD-10-CM | POA: Diagnosis not present

## 2023-04-23 ENCOUNTER — Encounter: Payer: Self-pay | Admitting: Internal Medicine

## 2023-05-21 ENCOUNTER — Ambulatory Visit: Payer: Medicare Other | Attending: Internal Medicine | Admitting: Internal Medicine

## 2023-05-21 ENCOUNTER — Encounter: Payer: Self-pay | Admitting: Internal Medicine

## 2023-05-21 ENCOUNTER — Telehealth: Payer: Self-pay | Admitting: *Deleted

## 2023-05-21 VITALS — BP 110/78 | HR 56 | Ht 62.5 in | Wt 150.4 lb

## 2023-05-21 DIAGNOSIS — I2510 Atherosclerotic heart disease of native coronary artery without angina pectoris: Secondary | ICD-10-CM

## 2023-05-21 DIAGNOSIS — E7801 Familial hypercholesterolemia: Secondary | ICD-10-CM

## 2023-05-21 DIAGNOSIS — E78019 Familial hypercholesterolemia, unspecified: Secondary | ICD-10-CM | POA: Insufficient documentation

## 2023-05-21 DIAGNOSIS — R400 Somnolence: Secondary | ICD-10-CM

## 2023-05-21 DIAGNOSIS — I421 Obstructive hypertrophic cardiomyopathy: Secondary | ICD-10-CM

## 2023-05-21 MED ORDER — EZETIMIBE 10 MG PO TABS: 10.0000 mg | ORAL_TABLET | Freq: Every day | ORAL | 3 refills | Status: DC

## 2023-05-21 NOTE — Telephone Encounter (Signed)
Pt has been ordered an Special educational needs teacher study by Dr. Izora Ribas. Pt is agreeable to signed waiver and to not open the box until she has been called with the PIN# .

## 2023-05-21 NOTE — Patient Instructions (Signed)
Medication Instructions:  Your physician has recommended you make the following change in your medication:  START Ezetimibe (Zetia) 10mg  daily.  *If you need a refill on your cardiac medications before your next appointment, please call your pharmacy*   Lab Work: None If you have labs (blood work) drawn today and your tests are completely normal, you will receive your results only by: MyChart Message (if you have MyChart) OR A paper copy in the mail If you have any lab test that is abnormal or we need to change your treatment, we will call you to review the results.   Testing/Procedures: Your physician has requested that you have a stress echocardiogram. For further information please visit [email or web address] Please follow instruction sheet as given.   Your physician has recommended that you have a sleep study. This test records several body functions during sleep, including: brain activity, eye movement, oxygen and carbon dioxide blood levels, heart rate and rhythm, breathing rate and rhythm, the flow of air through your mouth and nose, snoring, body muscle movements, and chest and belly movement.   Follow-Up: At [facility], you and your health needs are our priority.  As part of our continuing mission to provide you with exceptional heart care, we have created designated Provider Care Teams.  These Care Teams include your primary Cardiologist (physician) and Advanced Practice Providers (APPs -  Physician Assistants and Nurse Practitioners) who all work together to provide you with the care you need, when you need it.   Your next appointment:   3-4 month(s)  Provider:   Riley Lam, MD

## 2023-05-21 NOTE — Progress Notes (Signed)
Cardiology Office Note:    Date:  05/21/2023   ID:  Sheri Arellano, DOB 11/20/1951, MRN [id number]  PCP:  Marden Noble, MD (Inactive)   [facility] Providers Cardiologist:  Lance Muss, MD     Referring MD: Marden Noble, MD   CC:  HOCM  History of Present Illness:    Sheri Arellano is a 72 y.o. female with a hx of SVT ablation (AVNRT- Ladona Ridgel 2016), hx of DCIS with taxane based chemotherapy and 60 Gy of chest radiation,  with SOB found to have HOCM Seen 01/01/22.In interval has improvement in gradients on BB.  And did well with CPET.  Gave exercise prescription at beginning of the year.  Saw genetics but insurance would not cover genetic testing; no testing for kids largely with insurance issues. 2024: Older son has CMR.  No HCM.  Younger son  is getting tested this year.   Patient notes no SOB at rest abut new DOE Minimal exercise presently- she is now the slowest person in her friend group Notes worsening fatigue.  Notes no palpitations Notes no CP. Notes no dizziness. Notes no syncope. She is afraid to exercise at full capacity.   Past Medical History:  Diagnosis Date   Anxiety    probable panic attacks   Cancer (HCC)    breast   Dysphagia    Esophageal dysmotility    GERD (gastroesophageal reflux disease)    H/O hiatal hernia    Hyperlipidemia    Insomnia    Iron deficiency anemia    hx   Other malaise and fatigue    Radiation 06/05/14-07/18/14   left breast 50.4 gray, lumpectomy cavity boosted to 60.4 gray   SVT (supraventricular tachycardia)    documented-takes metoprolol as needed   Vitamin D deficiency     Past Surgical History:  Procedure Laterality Date   CARPAL TUNNEL RELEASE     lt and rt   CHOLECYSTECTOMY  1999   COLONOSCOPY WITH PROPOFOL N/A 03/20/2015   Procedure: COLONOSCOPY WITH PROPOFOL;  Surgeon: Charolett Bumpers, MD;  Location: [facility];  Service: Endoscopy;  Laterality: N/A;   ELECTROPHYSIOLOGIC STUDY N/A 05/30/2015    Procedure: SVT Ablation;  Surgeon: Marinus Maw, MD;  Location: [facility] INVASIVE CV LAB;  Service: Cardiovascular;  Laterality: N/A;   PARTIAL MASTECTOMY WITH NEEDLE LOCALIZATION AND AXILLARY SENTINEL LYMPH NODE BX Left 01/16/2014   Procedure: PARTIAL MASTECTOMY WITH NEEDLE LOCALIZATION AND AXILLARY SENTINEL LYMPH NODE BIOPSY;  Surgeon: Ernestene Mention, MD;  Location: [facility];  Service: General;  Laterality: Left;   PORT-A-CATH REMOVAL Right 08/23/2014   Procedure: MINOR REMOVAL PORT-A-CATH;  Surgeon: Claud Kelp, MD;  Location: [facility];  Service: General;  Laterality: Right;   PORTACATH PLACEMENT Right 02/28/2014   Procedure: INSERTION PORT-A-CATH;  Surgeon: Ernestene Mention, MD;  Location: [facility] OR;  Service: General;  Laterality: Right;   RIGHT/LEFT HEART CATH AND CORONARY ANGIOGRAPHY N/A 01/28/2022   Procedure: RIGHT/LEFT HEART CATH AND CORONARY ANGIOGRAPHY;  Surgeon: Corky Crafts, MD;  Location: [facility] INVASIVE CV LAB;  Service: Cardiovascular;  Laterality: N/A;    Current Medications: Current Meds  Medication Sig   acetaminophen-codeine (TYLENOL #3) 300-30 MG tablet Take 1 tablet by mouth every 8 (eight) hours as needed for moderate pain.   aspirin EC 81 MG tablet Take 81 mg by mouth daily.   Brimonidine Tartrate (LUMIFY) 0.025 % SOLN Place 1 drop into both eyes 2 (two) times daily as needed (dry  eyes).   celecoxib (CELEBREX) 200 MG capsule Take 200 mg by mouth 2 (two) times daily.   Cholecalciferol (VITAMIN D) 2000 UNITS tablet Take 2,000 Units by mouth daily.   clonazePAM (KLONOPIN) 1 MG tablet Take 1 mg by mouth at bedtime.   doxycycline (VIBRAMYCIN) 100 MG capsule Take 100 mg by mouth daily. Daily for Rosacea   ezetimibe (ZETIA) 10 MG tablet Take 1 tablet (10 mg total) by mouth daily.   furosemide (LASIX) 40 MG tablet TAKE 2 TABLETS BY MOUTH DAILY   loratadine (CLARITIN) 10 MG tablet Take 10 mg by mouth every evening.    Magnesium 250 MG TABS Take  625 mg by mouth in the morning and at bedtime.   metoprolol succinate (TOPROL-XL) 50 MG 24 hr tablet TAKE 1 AND 1/2 TABLETS BY MOUTH DAILY WITH OR IMMEDIATELY FOLLOWING A MEAL (Patient taking differently: 75 mg.)   nitroGLYCERIN (NITROSTAT) 0.4 MG SL tablet Place 1 tablet (0.4 mg total) under the tongue every 5 (five) minutes as needed (Esophageal spasms).   pantoprazole (PROTONIX) 40 MG tablet Take 40 mg by mouth daily.   potassium chloride SA (KLOR-CON M) 20 MEQ tablet Take 3 tablets (60 mEq total) by mouth daily.   rosuvastatin (CRESTOR) 10 MG tablet Take 10 mg by mouth at bedtime.   sertraline (ZOLOFT) 100 MG tablet Take 100 mg by mouth at bedtime.   Sunscreen SPF50 LOTN as directed. Skin cancer   traMADol (ULTRAM) 50 MG tablet Take 1 tablet (50 mg total) by mouth 2 (two) times daily as needed.   tretinoin (RETIN-A) 0.025 % cream as directed. Skin cancer   valACYclovir (VALTREX) 1000 MG tablet Take 1 g by mouth daily as needed (fever blisters).      Allergies:   Erythromycin base, Macrolides and ketolides, and Valsartan-hydrochlorothiazide   Social History   Socioeconomic History   Marital status: Married    Spouse name: Not on file   Number of children: 2   Years of education: Not on file   Highest education level: Not on file  Occupational History   Occupation: realtor    Employer: [employer]  Tobacco Use   Smoking status: Former    Packs/day: 1.50    Years: 8.00    Additional pack years: 0.00    Total pack years: 12.00    Types: Cigarettes    Quit date: 01/22/1976    Years since quitting: 47.3   Smokeless tobacco: Never  Vaping Use   Vaping Use: Never used  Substance and Sexual Activity   Alcohol use: Yes    Alcohol/week: 7.0 standard drinks of alcohol    Types: 7 Shots of liquor per week    Comment: 1 vodka per day   Drug use: No   Sexual activity: Yes    Birth control/protection: Post-menopausal  Other Topics Concern   Not on file  Social History  Narrative   Not on file   Social Determinants of Health   Financial Resource Strain: Not on file  Food Insecurity: Not on file  Transportation Needs: Not on file  Physical Activity: Not on file  Stress: Not on file  Social Connections: Not on file    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 There is no history of Colon cancer or CAD. History of coronary artery disease notable for CAD female. History of heart failure notable for CHF mother. History of arrhythmia notable for atrial fibrillation mother. Denies family history of sudden cardiac death including drowning, car accidents, or unexplained deaths in the family. No history of cardiomyopathies including hypertrophic cardiomyopathy, left ventricular non-compaction, or arrhythmogenic right ventricular cardiomyopathy.   ROS:   Please see the history of present illness.     EKGs/Labs/Other Studies Reviewed:    The following studies were reviewed today:  EKG:   05/21/23: Sinus bradycardia ; computer is mis-reading T waves as heart block  12/12/21: SR rate 84 with LVH  Cardiac Studies & Procedures   CARDIAC CATHETERIZATION  CARDIAC CATHETERIZATION 01/28/2022  Narrative   Mid LAD lesion is 25% stenosed.   The left ventricular systolic function is normal.   LV end diastolic pressure is normal.   The left ventricular ejection fraction is 55-65% by visual estimate.   There is no aortic valve stenosis.   Vasospasm noted in the right radial artery.  If emergency cath was needed, would consider alternative access site.  Minimal, nonobstructive coronary artery disease.  Continue aspirin along with aggressive secondary prevention, especially given her  family history of CAD.  Essentially normal right heart pressures.  Continue plans for treatment of HOCM.  Findings Coronary Findings Diagnostic  Dominance: Right  Left Anterior Descending Mid LAD lesion is 25% stenosed.  Ramus Intermedius Vessel is large.  Left Circumflex Vessel is small.  Right Coronary Artery The vessel exhibits minimal luminal irregularities.  Intervention  No interventions have been documented.   STRESS TESTS  ECHOCARDIOGRAM STRESS TEST 08/29/2022  Narrative EXERCISE STRESS ECHO REPORT   --------------------------------------------------------------------------------  Patient Name:   Kendrick Ranch Dehne Date of Exam: 01/23/2022 Medical Rec #:  [id number]        Height:       62.5 in Accession #:    [id number]       Weight:       158.0 lb Date of Birth:  August 06, 1951        BSA:          1.740 m Patient Age:    70 years         BP:           129/91 mmHg Patient Gender: F                HR:           68 bpm. Exam Location:  [address]  Procedure: Stress Echo, Cardiac Doppler and Limited Color Doppler  Indications:    I42.1 HOCM. STRESS ECHO HOCM evaluation  History:        Patient has prior history of Echocardiogram examinations, most recent 12/18/2021. Anemia.  Sonographer:    Jorje Guild [facility], RDCS Referring Phys: [id number] [facility] A Kiarah Eckstein  IMPRESSIONS   1. No exercise induced ischemic EKG changes. 2. This is an inconclusive stress echocardiogram for ischemia, due to lack of compete wall motion evaluation. The study was for evaluation of exercise-induced HOCM. 3. LVEF >75% at rest, moderate ASH, mild to moderate PI, grade 1 DD 4. The findings of the study demonstrate that Mild stress induced gradient - worse with valsalva (peak LVOT gradient 29 mmHG). Rest gradient of 16 mmHg. 5. This is a low risk study.  Comparison(s): A prior study was performed on 12/18/2021. LVEF 60-65%, moderate ASH, LVOT gradient up to 94 mmHg.  FINDINGS  Exam  Protocol: The patient exercised on a treadmill according to a Bruce protocol.   Patient Performance: The patient exercised for 6 minutes. The maximum stage achieved was II of the Bruce protocol. The heart rate at peak stress was 130 bpm. The target heart rate was calculated to be 129 bpm. The percentage of maximum predicted heart rate achieved was 85.9 %. The baseline blood pressure was 129/91 mmHg. The blood pressure at peak stress was 173/75 mmHg. The patient developed fatigue and hip pain during the stress exam.  EKG: Resting EKG showed normal sinus rhythm. The patient developed no abnormal EKG findings during exercise.   2D Echo Findings: The baseline ejection fraction was >75%. Baseline regional wall motion abnormalities were not present. This is an inconclusive stress echocardiogram for ischemia.  Stress Doppler:  LVOT: The baseline LVOT gradient was 16 mmHG. The peak LVOT gradient at stress was 29 mmHG. The findings of the study demonstrate that Mild stress induced gradient - worse with valsalva.   Zoila Shutter MD Electronically signed on 01/23/2022 at 5:13:12 PM      Final   ECHOCARDIOGRAM  ECHOCARDIOGRAM COMPLETE 12/16/2022  Narrative ECHOCARDIOGRAM REPORT    Patient Name:   Kendrick Ranch Quillin Date of Exam: 12/15/2022 Medical Rec #:  [id number]        Height:       62.0 in Accession #:    [id number]       Weight:       163.6 lb Date of Birth:  1950-12-13        BSA:          1.755 m Patient Age:    71 years         BP:           128/82 mmHg Patient Gender: F                HR:           56 bpm. Exam Location:  [address]  Procedure: 2D Echo, Cardiac Doppler, Color Doppler and Intracardiac Opacification Agent  Indications:    I42.2 Hocm  History:        Patient has prior history of Echocardiogram examinations, most recent 12/18/2021. Risk Factors:Dyslipidemia.  Sonographer:    Daphine Deutscher RDCS Referring Phys: [id number] [facility] A  Pardeep Pautz  IMPRESSIONS   1. Left ventricular ejection fraction, by estimation, is 60 to 65%. The left ventricle has normal function. The left ventricle has no regional wall motion abnormalities. There is moderate asymmetric left ventricular hypertrophy of the septal segment. Left ventricular diastolic parameters are indeterminate. 2. Right ventricular systolic function is normal. The right ventricular size is normal. There is normal pulmonary artery systolic pressure. 3. The mitral valve is grossly normal. Trivial mitral valve regurgitation. No evidence of mitral stenosis. 4. The aortic valve is tricuspid. There is mild calcification of the aortic valve. Aortic valve regurgitation is not visualized. No aortic stenosis is present. 5. The inferior vena cava is normal in size with greater than 50% respiratory variability, suggesting right atrial pressure of 3 mmHg.  Comparison(s): Prior images reviewed side by side. Changes from prior study are noted. LV outflow gradient signifcantly lower on current study (less than 5 mmHg) compared to study from 12/18/21.  FINDINGS Left Ventricle: Minimal LVOT gradient at rest (less than 5 mmHg) based on available images, no valsalva gradients available. Left ventricular ejection fraction, by estimation, is 60 to 65%. The left ventricle has normal function. The  left ventricle has no regional wall motion abnormalities. Definity contrast agent was given IV to delineate the left ventricular endocardial borders. The left ventricular internal cavity size was normal in size. There is moderate asymmetric left ventricular hypertrophy of the septal segment. Left ventricular diastolic parameters are indeterminate.  Right Ventricle: The right ventricular size is normal. No increase in right ventricular wall thickness. Right ventricular systolic function is normal. There is normal pulmonary artery systolic pressure. The tricuspid regurgitant velocity is 1.75 m/s, and with  an assumed right atrial pressure of 3 mmHg, the estimated right ventricular systolic pressure is 15.2 mmHg.  Left Atrium: Left atrial size was normal in size.  Right Atrium: Right atrial size was normal in size.  Pericardium: There is no evidence of pericardial effusion.  Mitral Valve: The mitral valve is grossly normal. Trivial mitral valve regurgitation. No evidence of mitral valve stenosis.  Tricuspid Valve: The tricuspid valve is grossly normal. Tricuspid valve regurgitation is trivial. No evidence of tricuspid stenosis.  Aortic Valve: The aortic valve is tricuspid. There is mild calcification of the aortic valve. Aortic valve regurgitation is not visualized. No aortic stenosis is present.  Pulmonic Valve: The pulmonic valve was not well visualized. Pulmonic valve regurgitation is not visualized.  Aorta: The aortic root, ascending aorta, aortic arch and descending aorta are all structurally normal, with no evidence of dilitation or obstruction.  Venous: The inferior vena cava is normal in size with greater than 50% respiratory variability, suggesting right atrial pressure of 3 mmHg.  IAS/Shunts: The atrial septum is grossly normal.   LEFT VENTRICLE PLAX 2D LVIDd:         4.10 cm   Diastology LVIDs:         2.50 cm   LV e' medial:    5.22 cm/s LV PW:         1.10 cm   LV E/e' medial:  16.2 LV IVS:        1.58 cm   LV e' lateral:   7.72 cm/s LVOT diam:     2.10 cm   LV E/e' lateral: 10.9 LV SV:         91 LV SV Index:   52 LVOT Area:     3.46 cm   RIGHT VENTRICLE             IVC RV Basal diam:  3.20 cm     IVC diam: 1.80 cm RV S prime:     11.25 cm/s TAPSE (M-mode): 2.5 cm  LEFT ATRIUM             Index        RIGHT ATRIUM           Index LA diam:        3.70 cm 2.11 cm/m   RA Area:     13.10 cm LA Vol (A2C):   29.3 ml 16.69 ml/m  RA Volume:   30.10 ml  17.15 ml/m LA Vol (A4C):   35.6 ml 20.28 ml/m LA Biplane Vol: 33.5 ml 19.09 ml/m AORTIC VALVE LVOT Vmax:    107.50 cm/s LVOT Vmean:  69.800 cm/s LVOT VTI:    0.262 m  AORTA Ao Root diam: 3.20 cm Ao Asc diam:  3.70 cm  MITRAL VALVE               TRICUSPID VALVE MV Area (PHT): 3.95 cm    TR Peak grad:   12.2 mmHg MV Decel Time: 192 msec  TR Vmax:        175.00 cm/s MV E velocity: 84.40 cm/s MV A velocity: 97.20 cm/s  SHUNTS MV E/A ratio:  0.87        Systemic VTI:  0.26 m Systemic Diam: 2.10 cm  Jodelle Red MD Electronically signed by Jodelle Red MD Signature Date/Time: 12/15/2022/7:58:48 PM    Final    MONITORS  LONG TERM MONITOR (3-14 DAYS) 01/25/2023  Narrative   Patient had a minimum heart rate of 46 bpm, maximum heart rate of 171 bpm (NSVT), and average heart rate of 59 bpm.   Predominant underlying rhythm was sinus rhythm.   One four beat run of NSVT.   Isolated PACs were rare (<1.0%).   Isolated PVCs were rare (<1.0%).   Triggered and diary events associated with sinus rhythm.  One short run of NSVT in the setting of HCM.   CT SCANS  CT CORONARY MORPH W/CTA COR W/SCORE 12/29/2018  Addendum 12/29/2018 12:48 PM ADDENDUM REPORT: 12/29/2018 12:45  CLINICAL DATA:  Chest pain  EXAM: Cardiac CTA  MEDICATIONS: Sub lingual nitro. 4 mg and lopressor 5mg   TECHNIQUE: The patient was scanned on a [employer] 192 scanner. Gantry rotation speed was 250 msecs. Collimation was. 6 mm . A 120 kV prospective scan was triggered in the ascending thoracic aorta at 140 HU's with full mA between 30-70% of the R-R interval . Average HR during the scan was 55 bpm. The 3D data set was interpreted on a dedicated work station using MPR, MIP and VRT modes. A total of 80 cc of contrast was used.  FINDINGS: Non-cardiac: See separate report from [facility]. No significant findings on limited lung and soft tissue windows.  Calcium score: No calcium noted  Coronary Arteries: Right dominant with no anomalies  LM: Normal  LAD: Normal  IM: Large  branching vessel normal  D1: Normal  Circumflex: Normal  OM1: Small vessel normal  AV Groove: Small and normal  RCA: Less than 30% soft plaque in mid vessel  PDA: Normal  PLA: Normal  IMPRESSION: 1.  Calcium score 0  2. Essentially normal right dominant coronary arteries with less than 30% soft plaque in mid RCA  3.  Normal aortic root diameter 3.6 cm  Charlton Haws   Electronically Signed By: Charlton Haws M.D. On: 12/29/2018 12:45  Narrative EXAM: OVER-READ INTERPRETATION  CT CHEST  The following report is an over-read performed by radiologist Dr. Trudie Reed of [facility] on 12/29/2018. This over-read does not include interpretation of cardiac or coronary anatomy or pathology. The coronary calcium score/coronary CTA interpretation by the cardiologist is attached.  COMPARISON:  None.  FINDINGS: Aortic atherosclerosis. Within the visualized portions of the thorax there are no suspicious appearing pulmonary nodules or masses, there is no acute consolidative airspace disease, no pleural effusions, no pneumothorax and no lymphadenopathy. Visualized portions of the upper abdomen are unremarkable. There are no aggressive appearing lytic or blastic lesions noted in the visualized portions of the skeleton. Surgical clips in the lateral aspect of the left breast likely from prior lumpectomy.  IMPRESSION: 1.  Aortic Atherosclerosis (ICD10-I70.0).  Electronically Signed: By: Trudie Reed M.D. On: 12/29/2018 12:01   CARDIAC MRI  MR CARDIAC MORPHOLOGY W WO CONTRAST 01/29/2022  Narrative CLINICAL DATA:  Clinical question of hypertrophic cardiomyopathy Study assumes HCT of 35 and BSA of 1.76 m2.  EXAM: CARDIAC MRI  TECHNIQUE: The patient was scanned on a 1.5 Tesla GE magnet. A dedicated cardiac coil  was used. Functional imaging was done using [employer]iest[employer] sequences. 2,3, and 4 chamber views were done to assess for RWMA's. Modified Simpson's  rule using a short axis stack was used to calculate an ejection fraction on a dedicated work [employer]. The patient received 10 cc of Gadavist. After 10 minutes inversion recovery sequences were used to assess for infiltration and scar tissue.  CONTRAST:  10 cc  of Gadavist  FINDINGS: 1. Normal left ventricular size, with LVEDD 43 mm, and LVEDVi 49 mL/m2.  Asymmetric sepal remodeling, with intraventricular septal thickness of 15 mm, posterior wall thickness of 7 mm, and myocardial mass index of 53 g/m2.  Normal left ventricular systolic function (LVEF =66%). There are no regional wall motion abnormalities. There is no systolic anterior motion of the anterior mitral valve.  Apical displacement of the anterior papillary muscle ventricular insertion.  ECV signal is mildly elevated (33% in the apex, 38% mid septum, 34% mid inferior).  Left ventricular parametric mapping notable for normal T2 mapping.  There is no late gadolinium enhancement in the left ventricular myocardium. Six standard deviation reference used.  2. Normal right ventricular size with RVEDVI 48 mL/m2.  Normal right ventricular thickness.  Normal right ventricular systolic function (RVEF =57%). There are no regional wall motion abnormalities or aneurysms.  3.  Normal left and right atrial size.  4. Normal size of the aortic root, ascending aorta and pulmonary artery.  5. Valve assessment:  Aortic Valve: Tri-leaflet aortic valve. Qualitatively no significant regurgitation.  Pulmonic Valve: Qualitatively no significant regurgitation.  Tricuspid Valve:Qualitatively no significant regurgitation.  Mitral Valve: Mild mitral regurgitation.  6.  Normal pericardium.  No pericardial effusion.  7. Grossly, no extracardiac findings. Recommended dedicated study if concerned for non-cardiac pathology.  IMPRESSION: Imaging criteria for hypertrophic cardiomyopathy met.  There is no  evidence of an LVOT obstruction on supine imaging.  There is no evidence of late gadolinium enhancement.  Riley Lam MD   Electronically Signed By: Riley Lam M.D. On: 01/29/2022 17:26           Recent Labs: No results found for requested labs within last 365 days.  Recent Lipid Panel No results found for: "CHOL", "TRIG", "HDL", "CHOLHDL", "VLDL", "LDLCALC", "LDLDIRECT"   Physical Exam:    VS:  BP 110/78   Pulse (!) 56   Ht 5' 2.5" (1.588 m)   Wt 150 lb 6.4 oz (68.2 kg)   SpO2 96%   BMI 27.07 kg/m     Wt Readings from Last 3 Encounters:  05/21/23 150 lb 6.4 oz (68.2 kg)  06/30/22 163 lb 9.6 oz (74.2 kg)  02/10/22 161 lb (73 kg)    Gen: no distress Neck: No JVD,  Cardiac: No Rubs or Gallops, harsh systolic murmur , regular rhythm, +2 radial pulses Respiratory: Clear to auscultation bilaterally, normal effort, normal  respiratory rate GI: Soft, nontender, non-distended  MS: No  edema;  moves all extremities Integument: Skin feels warm Neuro:  At time of evaluation, alert and oriented to person/place/time/situation  Psych: Normal affect, patient feels a bit anxious  ASSESSMENT:    1. HOCM (hypertrophic obstructive cardiomyopathy) (HCC)   2. Daytime somnolence   3. Familial hypercholesterolemia   4. Coronary artery disease involving native coronary artery of native heart without angina pectoris     PLAN:    Hypertrophic Cardiomyopathy - unable to afford genetic testing- we discussed cost of [facility] testing today ($299) and she will talk with her sons  -  LVO gradient resolved (resting) on medical therapy, prior inducible gradient - Youngest is are pending screening - NYHA II - CMR with no scar and 15 mm thickness - absence of atrial fibrillation - on metoprolol to 75 mg PO daily - Discussed option; she is scared of exercise; we will repeat her Stress echo and bring back if elevated and discuss options (CMI, ASA, SRT)   Familial  hypercholesterolemia Minimal non obstructive CAD Statin myopathy - could not tolerate rosuvastatin 10 mg PO three X weeks - will start zetia 10 mg and will check labs at follow up  Rare NSVT (2024) Rare asymptomatic SVT (prior ablation in 2016) - repeat heart monitor one week in 2025  - SCD risk low   Daytime somnolence - STOP-BANG 4 Intermediate Risk for moderate to severe OSA - sleep study   Time Spent Directly with Patient:   I have spent a total of 50 minutes with the patient reviewing notes, imaging, EKGs, labs and examining the patient as well as establishing an assessment and plan that was discussed personally with the patient.  > 50% of time was spent in direct patient care and and reviewing imaging with patient.   Medication Adjustments/Labs and Tests Ordered: Current medicines are reviewed at length with the patient today.  Concerns regarding medicines are outlined above.  Orders Placed This Encounter  Procedures   EKG 12-Lead   ECHOCARDIOGRAM STRESS TEST   [place] Sleep Study   Meds ordered this encounter  Medications   ezetimibe (ZETIA) 10 MG tablet    Sig: Take 1 tablet (10 mg total) by mouth daily.    Dispense:  90 tablet    Refill:  3    Patient Instructions  Medication Instructions:  Your physician has recommended you make the following change in your medication:  START Ezetimibe (Zetia) 10mg  daily.  *If you need a refill on your cardiac medications before your next appointment, please call your pharmacy*   Lab Work: None If you have labs (blood work) drawn today and your tests are completely normal, you will receive your results only by: MyChart Message (if you have MyChart) OR A paper copy in the mail If you have any lab test that is abnormal or we need to change your treatment, we will call you to review the results.   Testing/Procedures: Your physician has requested that you have a stress echocardiogram. For further information please visit  [email or web address] Please follow instruction sheet as given.   Your physician has recommended that you have a sleep study. This test records several body functions during sleep, including: brain activity, eye movement, oxygen and carbon dioxide blood levels, heart rate and rhythm, breathing rate and rhythm, the flow of air through your mouth and nose, snoring, body muscle movements, and chest and belly movement.   Follow-Up: At [facility], you and your health needs are our priority.  As part of our continuing mission to provide you with exceptional heart care, we have created designated Provider Care Teams.  These Care Teams include your primary Cardiologist (physician) and Advanced Practice Providers (APPs -  Physician Assistants and Nurse Practitioners) who all work together to provide you with the care you need, when you need it.   Your next appointment:   3-4 month(s)  Provider:   Riley Lam, MD   Signed, Christell Constant, MD  05/21/2023 3:19 PM    [facility]

## 2023-05-27 ENCOUNTER — Encounter (HOSPITAL_COMMUNITY): Payer: Self-pay | Admitting: *Deleted

## 2023-06-04 ENCOUNTER — Other Ambulatory Visit: Payer: Self-pay

## 2023-06-04 DIAGNOSIS — I421 Obstructive hypertrophic cardiomyopathy: Secondary | ICD-10-CM

## 2023-06-09 ENCOUNTER — Telehealth (HOSPITAL_COMMUNITY): Payer: Self-pay | Admitting: *Deleted

## 2023-06-09 NOTE — Telephone Encounter (Signed)
Patient given instructions for upcoming stress echo.  Sheri Arellano  

## 2023-06-10 NOTE — Telephone Encounter (Signed)
Prior Authorization for ITAMAR sent to [employer] via web portal. Tracking Number .   READY- do not require Pre-Authorization 

## 2023-06-12 NOTE — Telephone Encounter (Signed)
Left message for the pt that she her Itamar sleep study has been approved. I left PIN# on vm PIN# [id number], as well as if she may do her sleep study between tonight and early next week. Once results are read by Dr. Mayford Knife we will cal her with any recommendations. I will send her this message through [facility] CHART as well.

## 2023-06-16 ENCOUNTER — Encounter (INDEPENDENT_AMBULATORY_CARE_PROVIDER_SITE_OTHER): Payer: Medicare Other | Admitting: Cardiology

## 2023-06-16 ENCOUNTER — Ambulatory Visit: Payer: Medicare Other | Attending: Internal Medicine

## 2023-06-16 DIAGNOSIS — I421 Obstructive hypertrophic cardiomyopathy: Secondary | ICD-10-CM

## 2023-06-16 DIAGNOSIS — R400 Somnolence: Secondary | ICD-10-CM

## 2023-06-16 DIAGNOSIS — G4733 Obstructive sleep apnea (adult) (pediatric): Secondary | ICD-10-CM

## 2023-06-17 ENCOUNTER — Ambulatory Visit (HOSPITAL_COMMUNITY): Payer: Medicare Other | Attending: Cardiology

## 2023-06-17 ENCOUNTER — Ambulatory Visit (HOSPITAL_COMMUNITY): Payer: Medicare Other

## 2023-06-17 DIAGNOSIS — I421 Obstructive hypertrophic cardiomyopathy: Secondary | ICD-10-CM | POA: Insufficient documentation

## 2023-06-17 DIAGNOSIS — R400 Somnolence: Secondary | ICD-10-CM | POA: Diagnosis not present

## 2023-06-17 LAB — ECHOCARDIOGRAM STRESS TEST
Area-P 1/2: 2.08 cm2
S' Lateral: 2.55 cm

## 2023-06-19 DIAGNOSIS — L821 Other seborrheic keratosis: Secondary | ICD-10-CM | POA: Diagnosis not present

## 2023-06-19 DIAGNOSIS — L719 Rosacea, unspecified: Secondary | ICD-10-CM | POA: Diagnosis not present

## 2023-06-19 DIAGNOSIS — R238 Other skin changes: Secondary | ICD-10-CM | POA: Diagnosis not present

## 2023-06-19 DIAGNOSIS — D225 Melanocytic nevi of trunk: Secondary | ICD-10-CM | POA: Diagnosis not present

## 2023-06-19 DIAGNOSIS — L570 Actinic keratosis: Secondary | ICD-10-CM | POA: Diagnosis not present

## 2023-06-22 ENCOUNTER — Encounter: Payer: Self-pay | Admitting: Internal Medicine

## 2023-06-25 ENCOUNTER — Encounter: Payer: Self-pay | Admitting: Internal Medicine

## 2023-06-26 MED ORDER — METOPROLOL SUCCINATE ER 50 MG PO TB24: 50.0000 mg | ORAL_TABLET | Freq: Every day | ORAL | 3 refills | Status: DC

## 2023-06-26 NOTE — Procedures (Signed)
   SLEEP STUDY REPORT Patient Information Study Date: 06/16/2023 Patient Name: Sheri Arellano Patient ID: [id number] Birth Date: 1951-07-16 Age: 72 Gender: Female BMI: 27.6 (W=150 lb, H=5' 2'') Stopbang: 4 Referring Physician: Riley Lam, MD  TEST DESCRIPTION: Home sleep apnea testing was completed using the WatchPat, a Type 1 device, utilizing  peripheral arterial tonometry (PAT), chest movement, actigraphy, pulse oximetry, pulse rate, body position and snore.  AHI was calculated with apnea and hypopnea using valid sleep time as the denominator. RDI includes apneas,  hypopneas, and RERAs. The data acquired and the scoring of sleep and all associated events were performed in  accordance with the recommended standards and specifications as outlined in the AASM Manual for the Scoring of  Sleep and Associated Events 2.2.0 (2015).  FINDINGS:   1. Mild Obstructive Sleep Apnea with AHI 12.8/hr overall but moderate during REM sleep with a REM AHI of 26.8/hr.   2. No Central Sleep Apnea with pAHIc 1.3/hr.   3. Oxygen desaturations as low as 83%.   4. Mild snoring was present. O2 sats were < 88% for 3.3 min.   5. Total sleep time was 8 hrs and 46 min.   6. 12.9% of total sleep time was spent in REM sleep.   7. Normal sleep onset latency at 17 min.   8. Prolonged REM sleep onset latency at 340 min.   9. Total awakenings were 4.  10. Arrhythmia detection: None  DIAGNOSIS: Mild Obstructive Sleep Apnea (G47.33)  RECOMMENDATIONS: 1. Clinical correlation of these findings is necessary. The decision to treat obstructive sleep apnea (OSA) is usually  based on the presence of apnea symptoms or the presence of associated medical conditions such as Hypertension,  Congestive Heart Failure, Atrial Fibrillation or Obesity. The most common symptoms of OSA are snoring, gasping for  breath while sleeping, daytime sleepiness and fatigue.  2. Initiating apnea therapy is recommended given the  presence of symptoms and/or associated conditions.  Recommend proceeding with one of the following:  a. Auto-CPAP therapy with a pressure range of 5-20cm H2O.  b. An oral appliance (OA) that can be obtained from certain dentists with expertise in sleep medicine. These are  primarily of use in non-obese patients with mild and moderate disease.  c. An ENT consultation which may be useful to look for specific causes of obstruction and possible treatment  options.  d. If patient is intolerant to PAP therapy, consider referral to ENT for evaluation for hypoglossal nerve stimulator.  3. Close follow-up is necessary to ensure success with CPAP or oral appliance therapy for maximum benefit . 4. A follow-up oximetry study on CPAP is recommended to assess the adequacy of therapy and determine the need  for supplemental oxygen or the potential need for Bi-level therapy. An arterial blood gas to determine the adequacy of  baseline ventilation and oxygenation should also be considered. 5. Healthy sleep recommendations include: adequate nightly sleep (normal 7-9 hrs/night), avoidance of caffeine after  noon and alcohol near bedtime, and maintaining a sleep environment that is cool, dark and quiet. 6. Weight loss for overweight patients is recommended. Even modest amounts of weight loss can significantly  improve the severity of sleep apnea. 7. Snoring recommendations include: weight loss where appropriate, side sleeping, and avoidance of alcohol before  bed. 8. Operation of motor vehicle should be avoided when sleepy.   Signature: Armanda Magic, MD; [facility]; Diplomat, American Board of Sleep  Medicine Electronically Signed: 06/26/2023 9:26:30 AM

## 2023-06-29 NOTE — Telephone Encounter (Signed)
I was sent a message pt waiting on sleep study. I s/w the pt and stated she did the sleep study and she is waiting for her results. Message from Dr. Kallie Edward nurse Alena Bills stated she sent message to sleep coordinator. Pt said she is waiting to hear about her sleep study results. I assured the pt that I will send a message to the sleep coordinator Coralee North to call her with the results. The pt said ok and thank you.

## 2023-06-30 ENCOUNTER — Telehealth: Payer: Self-pay | Admitting: *Deleted

## 2023-06-30 DIAGNOSIS — R400 Somnolence: Secondary | ICD-10-CM

## 2023-06-30 DIAGNOSIS — G4733 Obstructive sleep apnea (adult) (pediatric): Secondary | ICD-10-CM

## 2023-06-30 NOTE — Telephone Encounter (Addendum)
The patient has been notified of the result. Left detailed message on voicemail and informed patient to call back. Latrelle Dodrill, CMA.  RETURN CALL:  The patient has been notified of the result and verbalized understanding.  All questions (if any) were answered. Latrelle Dodrill, CMA 06/30/2023 5:26 PM    Patient will talk things over with her husband and call us back.

## 2023-07-16 NOTE — Telephone Encounter (Signed)
Prior Authorization for Automatic Data sent to [employer] via web portal. Tracking Number . READY- do not require Pre-Authorization.

## 2023-07-17 NOTE — Telephone Encounter (Signed)
I sent a message back to sleep coordinator Coralee North, as to I am not sure message was sent to me. Looks like she gave the her results. I did not see anything mentioned if another Itamar study was being ordered.

## 2023-08-01 ENCOUNTER — Other Ambulatory Visit: Payer: Self-pay | Admitting: Interventional Cardiology

## 2023-08-03 ENCOUNTER — Other Ambulatory Visit: Payer: Self-pay | Admitting: *Deleted

## 2023-08-03 MED ORDER — FUROSEMIDE 40 MG PO TABS: 80.0000 mg | ORAL_TABLET | Freq: Every day | ORAL | 0 refills | Status: DC

## 2023-08-04 DIAGNOSIS — N958 Other specified menopausal and perimenopausal disorders: Secondary | ICD-10-CM | POA: Diagnosis not present

## 2023-08-04 DIAGNOSIS — K644 Residual hemorrhoidal skin tags: Secondary | ICD-10-CM | POA: Diagnosis not present

## 2023-08-04 DIAGNOSIS — R351 Nocturia: Secondary | ICD-10-CM | POA: Diagnosis not present

## 2023-08-04 DIAGNOSIS — N393 Stress incontinence (female) (male): Secondary | ICD-10-CM | POA: Diagnosis not present

## 2023-08-04 DIAGNOSIS — R350 Frequency of micturition: Secondary | ICD-10-CM | POA: Diagnosis not present

## 2023-08-04 DIAGNOSIS — N816 Rectocele: Secondary | ICD-10-CM | POA: Diagnosis not present

## 2023-08-24 DIAGNOSIS — K080 Exfoliation of teeth due to systemic causes: Secondary | ICD-10-CM | POA: Diagnosis not present

## 2023-08-25 ENCOUNTER — Encounter: Payer: Self-pay | Admitting: Internal Medicine

## 2023-08-25 DIAGNOSIS — E782 Mixed hyperlipidemia: Secondary | ICD-10-CM

## 2023-08-25 DIAGNOSIS — R196 Halitosis: Secondary | ICD-10-CM | POA: Diagnosis not present

## 2023-08-25 DIAGNOSIS — E7801 Familial hypercholesterolemia: Secondary | ICD-10-CM

## 2023-08-25 DIAGNOSIS — K219 Gastro-esophageal reflux disease without esophagitis: Secondary | ICD-10-CM | POA: Diagnosis not present

## 2023-08-25 DIAGNOSIS — I2510 Atherosclerotic heart disease of native coronary artery without angina pectoris: Secondary | ICD-10-CM

## 2023-08-31 MED ORDER — ATORVASTATIN CALCIUM 10 MG PO TABS: 10.0000 mg | ORAL_TABLET | Freq: Every day | ORAL | 3 refills | Status: DC

## 2023-08-31 NOTE — Addendum Note (Signed)
Addended by: Macie Burows on: 08/31/2023 09:38 AM   Modules accepted: Orders

## 2023-09-04 NOTE — Telephone Encounter (Signed)
Lvmtcb to schedule mychart visit for 09/30.

## 2023-09-07 ENCOUNTER — Ambulatory Visit: Payer: Medicare Other | Admitting: Internal Medicine

## 2023-09-07 ENCOUNTER — Ambulatory Visit: Payer: Medicare Other | Attending: Internal Medicine | Admitting: Internal Medicine

## 2023-09-07 VITALS — BP 105/74 | HR 78 | Ht 62.0 in | Wt 146.0 lb

## 2023-09-07 DIAGNOSIS — E782 Mixed hyperlipidemia: Secondary | ICD-10-CM

## 2023-09-07 DIAGNOSIS — T466X5A Adverse effect of antihyperlipidemic and antiarteriosclerotic drugs, initial encounter: Secondary | ICD-10-CM | POA: Diagnosis not present

## 2023-09-07 DIAGNOSIS — M791 Myalgia, unspecified site: Secondary | ICD-10-CM

## 2023-09-07 DIAGNOSIS — I2510 Atherosclerotic heart disease of native coronary artery without angina pectoris: Secondary | ICD-10-CM

## 2023-09-07 DIAGNOSIS — E785 Hyperlipidemia, unspecified: Secondary | ICD-10-CM | POA: Diagnosis not present

## 2023-09-07 NOTE — Patient Instructions (Addendum)
Medication Instructions:  Your physician recommends that you continue on your current medications as directed. Please refer to the Current Medication list given to you today.  *If you need a refill on your cardiac medications before your next appointment, please call your pharmacy*   Lab Work: NONE If you have labs (blood work) drawn today and your tests are completely normal, you will receive your results only by: MyChart Message (if you have MyChart) OR A paper copy in the mail If you have any lab test that is abnormal or we need to change your treatment, we will call you to review the results.   Testing/Procedures: NONE   Follow-Up: At [facility], you and your health needs are our priority.  As part of our continuing mission to provide you with exceptional heart care, we have created designated Provider Care Teams.  These Care Teams include your primary Cardiologist (physician) and Advanced Practice Providers (APPs -  Physician Assistants and Nurse Practitioners) who all work together to provide you with the care you need, when you need it.   Your next appointment:   JANUARY 2025  Provider:   Riley Lam, MD

## 2023-09-07 NOTE — Progress Notes (Signed)
BSA:          1.704 m Patient Age:    72 years         BP:           104/74 mmHg Patient Gender: F                HR:           63 bpm. Exam Location:  [address]  Procedure: Stress Echo, Limited Color Doppler and Cardiac Doppler  Indications:    hypertrophic obstructive cardiomyopathy I42.1  History:        Patient has prior history of Echocardiogram examinations,  most recent 12/15/2022.  Sonographer:    Thurman Coyer RDCS Referring Phys: [id number] [facility] A Tashya Alberty   Conclusion(s)/Recommendation(s): At rest LVOT gradient was 8 mmHg, and 13 mmHg with valsalva. With exercise, the gradient increased to 23 mmHg.  FINDINGS  Exam Protocol: The patient exercised on a treadmill according to a Bruce protocol.   Patient Performance: The patient exercised for 6 minutes and 50 seconds, achieving 8 METS. The maximum stage achieved was II of the Bruce protocol. The heart rate at peak stress was 123 bpm. The target heart rate was calculated to be 128 bpm. The percentage of maximum predicted heart rate achieved was 81.5 %. The baseline blood pressure was 104/74 mmHg. The blood pressure at peak stress was 132/59 mmHg.  EKG: The patient developed no abnormal EKG findings during exercise.   2D Echo Findings: Baseline regional wall motion abnormalities were not present.   Carolan Clines Electronically signed on 06/17/2023 at 6:56:25 PM      Final   ECHOCARDIOGRAM  ECHOCARDIOGRAM COMPLETE 12/15/2022  Narrative ECHOCARDIOGRAM REPORT    Patient Name:   Sheri Arellano Date of Exam: 12/15/2022 Medical Rec #:  [id number]        Height:       62.0 in Accession #:    [id number]       Weight:       163.6 lb Date of Birth:  March 10, 1951        BSA:          1.755 m Patient Age:    72 years         BP:           128/82 mmHg Patient Gender: F                HR:           56 bpm. Exam Location:  [address]  Procedure: 2D Echo, Cardiac Doppler, Color Doppler and Intracardiac Opacification Agent  Indications:    I42.2 Hocm  History:        Patient has prior history of Echocardiogram examinations, most recent 12/18/2021. Risk Factors:Dyslipidemia.  Sonographer:    Daphine Deutscher RDCS Referring Phys: [id number] [facility] A Kaaliyah Kita  IMPRESSIONS   1. Left ventricular ejection fraction, by estimation, is 60 to 65%. The left ventricle has normal  function. The left ventricle has no regional wall motion abnormalities. There is moderate asymmetric left ventricular hypertrophy of the septal segment. Left ventricular diastolic parameters are indeterminate. 2. Right ventricular systolic function is normal. The right ventricular size is normal. There is normal pulmonary artery systolic pressure. 3. The mitral valve is grossly normal. Trivial mitral valve regurgitation. No evidence of mitral stenosis. 4. The aortic valve is tricuspid. There is mild calcification of the aortic valve. Aortic valve regurgitation is not visualized. No aortic stenosis is present. 5.  BSA:          1.704 m Patient Age:    72 years         BP:           104/74 mmHg Patient Gender: F                HR:           63 bpm. Exam Location:  [address]  Procedure: Stress Echo, Limited Color Doppler and Cardiac Doppler  Indications:    hypertrophic obstructive cardiomyopathy I42.1  History:        Patient has prior history of Echocardiogram examinations,  most recent 12/15/2022.  Sonographer:    Thurman Coyer RDCS Referring Phys: [id number] [facility] A Tashya Alberty   Conclusion(s)/Recommendation(s): At rest LVOT gradient was 8 mmHg, and 13 mmHg with valsalva. With exercise, the gradient increased to 23 mmHg.  FINDINGS  Exam Protocol: The patient exercised on a treadmill according to a Bruce protocol.   Patient Performance: The patient exercised for 6 minutes and 50 seconds, achieving 8 METS. The maximum stage achieved was II of the Bruce protocol. The heart rate at peak stress was 123 bpm. The target heart rate was calculated to be 128 bpm. The percentage of maximum predicted heart rate achieved was 81.5 %. The baseline blood pressure was 104/74 mmHg. The blood pressure at peak stress was 132/59 mmHg.  EKG: The patient developed no abnormal EKG findings during exercise.   2D Echo Findings: Baseline regional wall motion abnormalities were not present.   Carolan Clines Electronically signed on 06/17/2023 at 6:56:25 PM      Final   ECHOCARDIOGRAM  ECHOCARDIOGRAM COMPLETE 12/15/2022  Narrative ECHOCARDIOGRAM REPORT    Patient Name:   Sheri Arellano Date of Exam: 12/15/2022 Medical Rec #:  [id number]        Height:       62.0 in Accession #:    [id number]       Weight:       163.6 lb Date of Birth:  March 10, 1951        BSA:          1.755 m Patient Age:    72 years         BP:           128/82 mmHg Patient Gender: F                HR:           56 bpm. Exam Location:  [address]  Procedure: 2D Echo, Cardiac Doppler, Color Doppler and Intracardiac Opacification Agent  Indications:    I42.2 Hocm  History:        Patient has prior history of Echocardiogram examinations, most recent 12/18/2021. Risk Factors:Dyslipidemia.  Sonographer:    Daphine Deutscher RDCS Referring Phys: [id number] [facility] A Kaaliyah Kita  IMPRESSIONS   1. Left ventricular ejection fraction, by estimation, is 60 to 65%. The left ventricle has normal  function. The left ventricle has no regional wall motion abnormalities. There is moderate asymmetric left ventricular hypertrophy of the septal segment. Left ventricular diastolic parameters are indeterminate. 2. Right ventricular systolic function is normal. The right ventricular size is normal. There is normal pulmonary artery systolic pressure. 3. The mitral valve is grossly normal. Trivial mitral valve regurgitation. No evidence of mitral stenosis. 4. The aortic valve is tricuspid. There is mild calcification of the aortic valve. Aortic valve regurgitation is not visualized. No aortic stenosis is present. 5.  BSA:          1.704 m Patient Age:    72 years         BP:           104/74 mmHg Patient Gender: F                HR:           63 bpm. Exam Location:  [address]  Procedure: Stress Echo, Limited Color Doppler and Cardiac Doppler  Indications:    hypertrophic obstructive cardiomyopathy I42.1  History:        Patient has prior history of Echocardiogram examinations,  most recent 12/15/2022.  Sonographer:    Thurman Coyer RDCS Referring Phys: [id number] [facility] A Tashya Alberty   Conclusion(s)/Recommendation(s): At rest LVOT gradient was 8 mmHg, and 13 mmHg with valsalva. With exercise, the gradient increased to 23 mmHg.  FINDINGS  Exam Protocol: The patient exercised on a treadmill according to a Bruce protocol.   Patient Performance: The patient exercised for 6 minutes and 50 seconds, achieving 8 METS. The maximum stage achieved was II of the Bruce protocol. The heart rate at peak stress was 123 bpm. The target heart rate was calculated to be 128 bpm. The percentage of maximum predicted heart rate achieved was 81.5 %. The baseline blood pressure was 104/74 mmHg. The blood pressure at peak stress was 132/59 mmHg.  EKG: The patient developed no abnormal EKG findings during exercise.   2D Echo Findings: Baseline regional wall motion abnormalities were not present.   Carolan Clines Electronically signed on 06/17/2023 at 6:56:25 PM      Final   ECHOCARDIOGRAM  ECHOCARDIOGRAM COMPLETE 12/15/2022  Narrative ECHOCARDIOGRAM REPORT    Patient Name:   Sheri Arellano Date of Exam: 12/15/2022 Medical Rec #:  [id number]        Height:       62.0 in Accession #:    [id number]       Weight:       163.6 lb Date of Birth:  March 10, 1951        BSA:          1.755 m Patient Age:    72 years         BP:           128/82 mmHg Patient Gender: F                HR:           56 bpm. Exam Location:  [address]  Procedure: 2D Echo, Cardiac Doppler, Color Doppler and Intracardiac Opacification Agent  Indications:    I42.2 Hocm  History:        Patient has prior history of Echocardiogram examinations, most recent 12/18/2021. Risk Factors:Dyslipidemia.  Sonographer:    Daphine Deutscher RDCS Referring Phys: [id number] [facility] A Kaaliyah Kita  IMPRESSIONS   1. Left ventricular ejection fraction, by estimation, is 60 to 65%. The left ventricle has normal  function. The left ventricle has no regional wall motion abnormalities. There is moderate asymmetric left ventricular hypertrophy of the septal segment. Left ventricular diastolic parameters are indeterminate. 2. Right ventricular systolic function is normal. The right ventricular size is normal. There is normal pulmonary artery systolic pressure. 3. The mitral valve is grossly normal. Trivial mitral valve regurgitation. No evidence of mitral stenosis. 4. The aortic valve is tricuspid. There is mild calcification of the aortic valve. Aortic valve regurgitation is not visualized. No aortic stenosis is present. 5.  Cardiology Office Note:  .    Date:  09/07/2023  ID:  MAYRA JOLLIFFE, DOB 04-22-1951, MRN [id number] PCP: Marden Noble, MD  [facility] Providers Cardiologist:  Christell Constant, MD     CC: Video visit for discussion of Lp(a) elevation  Video Note   Because of Mylene R Andreason's co-morbid illnesses, she is at least at moderate risk for complications without adequate follow up.  This format is felt to be most appropriate for this patient at this time.  All issues noted in this document were discussed and addressed.  A limited physical exam was performed with this format.  Please refer to the patient's chart for her consent to telehealth for [facility].    Date:  09/07/2023   ID:  PATRYCE DEPRIEST, DOB 19-Feb-1951, MRN [id number] The patient was identified using 2 identifiers.  Patient Location: Home Provider Location: Office/Clinic   History of Present Illness: Marland Kitchen    WILSIE KERN is a 72 y.o. female obstructive hypertrophic cardiomyopathy.  She established with me in 2023 and she was NYHA class I as of earlier this year 2024: She had an elevation in Lp(a).  She has statin myalgias.  She is only on zetia for her HLD. She had mild CAD on prior Cardiac CT.  This visit is for discussion of her therapy options.  Ms. 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 The patient's cholesterol levels were last checked in October 2020, with an LDL level of 107. The patient has had adverse reactions to statins in the past, including muscle aches with rosuvastatin, Crestor, and Lipitor. The patient expresses a desire to have their arteries checked again, as they had a CT scan in January 2020 that showed mild nonobstructive coronary artery calcifications. The patient is interested  in seeing if there has been any progression since then.  Relevant histories: .  ROS: As per HPI.   Studies Reviewed: .   Cardiac Studies & Procedures   CARDIAC CATHETERIZATION  CARDIAC CATHETERIZATION 01/28/2022  Narrative   Mid LAD lesion is 25% stenosed.   The left ventricular systolic function is normal.   LV end diastolic pressure is normal.   The left ventricular ejection fraction is 55-65% by visual estimate.   There is no aortic valve stenosis.   Vasospasm noted in the right radial artery.  If emergency cath was needed, would consider alternative access site.  Minimal, nonobstructive coronary artery disease.  Continue aspirin along with aggressive secondary prevention, especially given her family history of CAD.  Essentially normal right heart pressures.  Continue plans for treatment of HOCM.  Findings Coronary Findings Diagnostic  Dominance: Right  Left Anterior Descending Mid LAD lesion is 25% stenosed.  Ramus Intermedius Vessel is large.  Left Circumflex Vessel is small.  Right Coronary Artery The vessel exhibits minimal luminal irregularities.  Intervention  No interventions have been documented.   STRESS TESTS  ECHOCARDIOGRAM STRESS TEST 06/17/2023  Narrative EXERCISE STRESS ECHO REPORT   --------------------------------------------------------------------------------  Patient Name:   Sheri Ranch Bass Date of Exam: 06/17/2023 Medical Rec #:  [id number]        Height:       62.5 in Accession #:    [id number]       Weight:       150.4 lb Date of Birth:  1951-09-07  Cardiology Office Note:  .    Date:  09/07/2023  ID:  MAYRA JOLLIFFE, DOB 04-22-1951, MRN [id number] PCP: Marden Noble, MD  [facility] Providers Cardiologist:  Christell Constant, MD     CC: Video visit for discussion of Lp(a) elevation  Video Note   Because of Mylene R Andreason's co-morbid illnesses, she is at least at moderate risk for complications without adequate follow up.  This format is felt to be most appropriate for this patient at this time.  All issues noted in this document were discussed and addressed.  A limited physical exam was performed with this format.  Please refer to the patient's chart for her consent to telehealth for [facility].    Date:  09/07/2023   ID:  PATRYCE DEPRIEST, DOB 19-Feb-1951, MRN [id number] The patient was identified using 2 identifiers.  Patient Location: Home Provider Location: Office/Clinic   History of Present Illness: Marland Kitchen    WILSIE KERN is a 72 y.o. female obstructive hypertrophic cardiomyopathy.  She established with me in 2023 and she was NYHA class I as of earlier this year 2024: She had an elevation in Lp(a).  She has statin myalgias.  She is only on zetia for her HLD. She had mild CAD on prior Cardiac CT.  This visit is for discussion of her therapy options.  Ms. 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 The patient's cholesterol levels were last checked in October 2020, with an LDL level of 107. The patient has had adverse reactions to statins in the past, including muscle aches with rosuvastatin, Crestor, and Lipitor. The patient expresses a desire to have their arteries checked again, as they had a CT scan in January 2020 that showed mild nonobstructive coronary artery calcifications. The patient is interested  in seeing if there has been any progression since then.  Relevant histories: .  ROS: As per HPI.   Studies Reviewed: .   Cardiac Studies & Procedures   CARDIAC CATHETERIZATION  CARDIAC CATHETERIZATION 01/28/2022  Narrative   Mid LAD lesion is 25% stenosed.   The left ventricular systolic function is normal.   LV end diastolic pressure is normal.   The left ventricular ejection fraction is 55-65% by visual estimate.   There is no aortic valve stenosis.   Vasospasm noted in the right radial artery.  If emergency cath was needed, would consider alternative access site.  Minimal, nonobstructive coronary artery disease.  Continue aspirin along with aggressive secondary prevention, especially given her family history of CAD.  Essentially normal right heart pressures.  Continue plans for treatment of HOCM.  Findings Coronary Findings Diagnostic  Dominance: Right  Left Anterior Descending Mid LAD lesion is 25% stenosed.  Ramus Intermedius Vessel is large.  Left Circumflex Vessel is small.  Right Coronary Artery The vessel exhibits minimal luminal irregularities.  Intervention  No interventions have been documented.   STRESS TESTS  ECHOCARDIOGRAM STRESS TEST 06/17/2023  Narrative EXERCISE STRESS ECHO REPORT   --------------------------------------------------------------------------------  Patient Name:   Sheri Ranch Bass Date of Exam: 06/17/2023 Medical Rec #:  [id number]        Height:       62.5 in Accession #:    [id number]       Weight:       150.4 lb Date of Birth:  1951-09-07  Cardiology Office Note:  .    Date:  09/07/2023  ID:  MAYRA JOLLIFFE, DOB 04-22-1951, MRN [id number] PCP: Marden Noble, MD  [facility] Providers Cardiologist:  Christell Constant, MD     CC: Video visit for discussion of Lp(a) elevation  Video Note   Because of Mylene R Andreason's co-morbid illnesses, she is at least at moderate risk for complications without adequate follow up.  This format is felt to be most appropriate for this patient at this time.  All issues noted in this document were discussed and addressed.  A limited physical exam was performed with this format.  Please refer to the patient's chart for her consent to telehealth for [facility].    Date:  09/07/2023   ID:  PATRYCE DEPRIEST, DOB 19-Feb-1951, MRN [id number] The patient was identified using 2 identifiers.  Patient Location: Home Provider Location: Office/Clinic   History of Present Illness: Marland Kitchen    WILSIE KERN is a 72 y.o. female obstructive hypertrophic cardiomyopathy.  She established with me in 2023 and she was NYHA class I as of earlier this year 2024: She had an elevation in Lp(a).  She has statin myalgias.  She is only on zetia for her HLD. She had mild CAD on prior Cardiac CT.  This visit is for discussion of her therapy options.  Ms. 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 The patient's cholesterol levels were last checked in October 2020, with an LDL level of 107. The patient has had adverse reactions to statins in the past, including muscle aches with rosuvastatin, Crestor, and Lipitor. The patient expresses a desire to have their arteries checked again, as they had a CT scan in January 2020 that showed mild nonobstructive coronary artery calcifications. The patient is interested  in seeing if there has been any progression since then.  Relevant histories: .  ROS: As per HPI.   Studies Reviewed: .   Cardiac Studies & Procedures   CARDIAC CATHETERIZATION  CARDIAC CATHETERIZATION 01/28/2022  Narrative   Mid LAD lesion is 25% stenosed.   The left ventricular systolic function is normal.   LV end diastolic pressure is normal.   The left ventricular ejection fraction is 55-65% by visual estimate.   There is no aortic valve stenosis.   Vasospasm noted in the right radial artery.  If emergency cath was needed, would consider alternative access site.  Minimal, nonobstructive coronary artery disease.  Continue aspirin along with aggressive secondary prevention, especially given her family history of CAD.  Essentially normal right heart pressures.  Continue plans for treatment of HOCM.  Findings Coronary Findings Diagnostic  Dominance: Right  Left Anterior Descending Mid LAD lesion is 25% stenosed.  Ramus Intermedius Vessel is large.  Left Circumflex Vessel is small.  Right Coronary Artery The vessel exhibits minimal luminal irregularities.  Intervention  No interventions have been documented.   STRESS TESTS  ECHOCARDIOGRAM STRESS TEST 06/17/2023  Narrative EXERCISE STRESS ECHO REPORT   --------------------------------------------------------------------------------  Patient Name:   Sheri Ranch Bass Date of Exam: 06/17/2023 Medical Rec #:  [id number]        Height:       62.5 in Accession #:    [id number]       Weight:       150.4 lb Date of Birth:  1951-09-07

## 2023-09-09 NOTE — Telephone Encounter (Signed)
MD addressed concern during video visit.

## 2023-10-14 ENCOUNTER — Other Ambulatory Visit: Payer: Self-pay

## 2023-10-14 MED ORDER — POTASSIUM CHLORIDE CRYS ER 20 MEQ PO TBCR: 60.0000 meq | EXTENDED_RELEASE_TABLET | Freq: Every day | ORAL | 3 refills | Status: AC

## 2023-10-22 ENCOUNTER — Telehealth: Payer: Self-pay | Admitting: Physician Assistant

## 2023-10-22 ENCOUNTER — Telehealth: Payer: Self-pay | Admitting: Radiology

## 2023-10-22 NOTE — Telephone Encounter (Signed)
Patient requests refill on meloxicam

## 2023-10-22 NOTE — Telephone Encounter (Signed)
Left voicemail for patient

## 2023-10-22 NOTE — Telephone Encounter (Signed)
Patient called. Returning a call to [employer]

## 2023-10-23 ENCOUNTER — Telehealth: Payer: Self-pay | Admitting: Physician Assistant

## 2023-10-23 NOTE — Telephone Encounter (Signed)
Patient called and said for her arthritis she would like to try meloxicam. [facility] on [address]. [phone]

## 2023-10-23 NOTE — Telephone Encounter (Signed)
Attempted to contact patient again.

## 2023-10-26 ENCOUNTER — Other Ambulatory Visit: Payer: Self-pay | Admitting: Physician Assistant

## 2023-10-26 MED ORDER — MELOXICAM 7.5 MG PO TABS: 7.5000 mg | ORAL_TABLET | Freq: Every day | ORAL | 1 refills | Status: DC | PRN

## 2023-10-26 NOTE — Telephone Encounter (Signed)
sent 

## 2023-11-02 DIAGNOSIS — I422 Other hypertrophic cardiomyopathy: Secondary | ICD-10-CM | POA: Diagnosis not present

## 2023-11-02 DIAGNOSIS — I10 Essential (primary) hypertension: Secondary | ICD-10-CM | POA: Diagnosis not present

## 2023-11-02 DIAGNOSIS — E038 Other specified hypothyroidism: Secondary | ICD-10-CM | POA: Diagnosis not present

## 2023-11-02 DIAGNOSIS — E782 Mixed hyperlipidemia: Secondary | ICD-10-CM | POA: Diagnosis not present

## 2023-11-05 ENCOUNTER — Encounter: Payer: Self-pay | Admitting: Internal Medicine

## 2023-11-05 DIAGNOSIS — E782 Mixed hyperlipidemia: Secondary | ICD-10-CM

## 2023-11-05 DIAGNOSIS — I2510 Atherosclerotic heart disease of native coronary artery without angina pectoris: Secondary | ICD-10-CM

## 2023-11-10 NOTE — Telephone Encounter (Signed)
Added red yeast rice 1200 mg and phytosterol 850 mg to medication list.

## 2023-11-10 NOTE — Addendum Note (Signed)
Addended by: Macie Burows on: 11/10/2023 02:43 PM   Modules accepted: Orders

## 2023-11-10 NOTE — Addendum Note (Signed)
Addended by: Macie Burows on: 11/10/2023 03:34 PM   Modules accepted: Orders

## 2023-12-20 ENCOUNTER — Other Ambulatory Visit: Payer: Self-pay | Admitting: Physician Assistant

## 2023-12-28 ENCOUNTER — Other Ambulatory Visit: Payer: Self-pay

## 2023-12-28 MED ORDER — METOPROLOL SUCCINATE ER 50 MG PO TB24: 50.0000 mg | ORAL_TABLET | Freq: Every day | ORAL | 1 refills | Status: DC

## 2024-02-11 DIAGNOSIS — H02831 Dermatochalasis of right upper eyelid: Secondary | ICD-10-CM | POA: Diagnosis not present

## 2024-02-11 DIAGNOSIS — I422 Other hypertrophic cardiomyopathy: Secondary | ICD-10-CM | POA: Diagnosis not present

## 2024-02-11 DIAGNOSIS — H02834 Dermatochalasis of left upper eyelid: Secondary | ICD-10-CM | POA: Diagnosis not present

## 2024-02-11 DIAGNOSIS — H57813 Brow ptosis, bilateral: Secondary | ICD-10-CM | POA: Diagnosis not present

## 2024-02-16 ENCOUNTER — Other Ambulatory Visit: Payer: Self-pay | Admitting: Physician Assistant

## 2024-03-08 ENCOUNTER — Ambulatory Visit: Admitting: Physician Assistant

## 2024-03-08 DIAGNOSIS — M65341 Trigger finger, right ring finger: Secondary | ICD-10-CM | POA: Diagnosis not present

## 2024-03-08 DIAGNOSIS — M65332 Trigger finger, left middle finger: Secondary | ICD-10-CM | POA: Diagnosis not present

## 2024-03-08 DIAGNOSIS — M65331 Trigger finger, right middle finger: Secondary | ICD-10-CM | POA: Diagnosis not present

## 2024-03-08 MED ORDER — METHYLPREDNISOLONE ACETATE 40 MG/ML IJ SUSP
13.3300 mg | INTRAMUSCULAR | Status: AC | PRN
  Administered 2024-03-08: 13.33 mg

## 2024-03-08 MED ORDER — LIDOCAINE HCL 1 % IJ SOLN
1.0000 mL | INTRAMUSCULAR | Status: AC | PRN
  Administered 2024-03-08: 1 mL

## 2024-03-08 MED ORDER — BUPIVACAINE HCL 0.25 % IJ SOLN
0.3300 mL | INTRAMUSCULAR | Status: AC | PRN
  Administered 2024-03-08: .33 mL

## 2024-03-08 NOTE — Progress Notes (Signed)
 Office Visit Note   Patient: Sheri Arellano           Date of Birth: Apr 28, 1951           MRN: [id number] Visit Date: 03/08/2024              Requested by: No referring provider defined for this encounter. PCP: Marden Noble, MD (Inactive)   Assessment & Plan: Visit Diagnoses:  1. Trigger finger, left middle finger   2. Trigger finger, right middle finger   3. Trigger finger, right ring finger     Plan: Impression is right middle, right ring and left middle trigger fingers.  We have discussed proceeding with trigger finger injections today as well as night splinting for the next week.  She is agreeable to this plan.  She will follow-up with [place] as needed.  Call with concerns or questions.  Follow-Up Instructions: Return if symptoms worsen or fail to improve.   Orders:  Orders Placed This Encounter  Procedures   Hand/UE Inj: R long A1   Hand/UE Inj: R ring A1   Hand/UE Inj: L long A1   No orders of the defined types were placed in this encounter.     Procedures: Hand/UE Inj: R long A1 for trigger finger on 03/08/2024 2:53 PM Indications: pain Details: 25 G needle Medications: 1 mL lidocaine 1 %; 0.33 mL bupivacaine 0.25 %; 13.33 mg methylPREDNISolone acetate 40 MG/ML   Hand/UE Inj: R ring A1 for trigger finger on 03/08/2024 2:54 PM Indications: pain Details: 25 G needle Medications: 1 mL lidocaine 1 %; 0.33 mL bupivacaine 0.25 %; 13.33 mg methylPREDNISolone acetate 40 MG/ML   Hand/UE Inj: L long A1 for trigger finger on 03/08/2024 2:54 PM Indications: pain Details: 25 G needle Medications: 1 mL lidocaine 1 %; 0.33 mL bupivacaine 0.25 %; 13.33 mg methylPREDNISolone acetate 40 MG/ML      Clinical Data: No additional findings.   Subjective: Chief Complaint  Patient presents with   Left Middle Finger - Pain   Right Middle Finger - Pain    HPI patient is a pleasant 73 year old female who comes in today with pain and triggering to the right long, right ring and  left long fingers.  Symptoms began about a year ago.  She denies any pain to the A1 pulley just notes triggering worse in the morning.  She does have a history of trigger fingers years ago.  No previous injections.  She is not a diabetic.  Review of Systems as detailed in HPI.  All others reviewed and are negative.   Objective: Vital Signs: There were no vitals taken for this visit.  Physical Exam well-developed well-nourished female no acute distress.  Alert and oriented x 3.  Ortho Exam bilateral hand exam: Mild tenderness to the A1 pulleys of the right long and ring fingers as well as the left long finger.  No evidence of a palpable nodule.  She does have reproducible triggering.  She is neurovascularly intact distally.  Specialty Comments:  No specialty comments available.  Imaging: No new imaging   PMFS History: Patient Active Problem List   Diagnosis Date Noted   Myalgia due to statin 09/07/2023   Daytime somnolence 05/21/2023   Familial hypercholesterolemia 05/21/2023   Coronary artery disease involving native coronary artery of native heart without angina pectoris 05/21/2023   HOCM (hypertrophic obstructive cardiomyopathy) (HCC) 06/30/2022   Dyspnea on exertion    Greater trochanteric bursitis, right 07/03/2021   Acid reflux 07/25/2020  Anemia, iron deficiency 07/25/2020   Avitaminosis D 07/25/2020   Cannot sleep 07/25/2020   Mixed hyperlipidemia 07/25/2020   Cough 07/25/2020   Current drug use 07/25/2020   Discharge from the vagina 07/25/2020   Fatigue 07/25/2020   Pain in female genitalia on intercourse 07/25/2020   Bilateral hip pain 05/29/2020   SVT (supraventricular tachycardia) (HCC) 05/30/2015   Atrophic vaginitis 07/26/2014   Hemorrhoids without complication 07/26/2014   Malignant neoplasm of upper-outer quadrant of left breast in female, estrogen receptor negative (HCC) 12/15/2013   Environmental allergies 05/21/2012   Rectal mucosa prolapse, anal skin  tags 02/16/2012   HYPERCHOLESTEROLEMIA 03/28/2009   Anxiety state 03/28/2009   SUPRAVENTRICULAR TACHYCARDIA 03/28/2009   Past Medical History:  Diagnosis Date   Anxiety    probable panic attacks   Cancer (HCC)    breast   Dysphagia    Esophageal dysmotility    GERD (gastroesophageal reflux disease)    H/O hiatal hernia    Hyperlipidemia    Insomnia    Iron deficiency anemia    hx   Other malaise and fatigue    Radiation 06/05/14-07/18/14   left breast 50.4 gray, lumpectomy cavity boosted to 60.4 gray   SVT (supraventricular tachycardia)    documented-takes metoprolol as needed   Vitamin D deficiency     Family History  Problem Relation Age of Onset   Atrial fibrillation Mother 49   Hyperlipidemia Mother    Hypertension Mother    Cancer Father 40       bladder   Hypercholesterolemia Father    Hypertension Father    Cancer Maternal Aunt        breast   Colon cancer Neg Hx    CAD Neg Hx     Past Surgical History:  Procedure Laterality Date   CARPAL TUNNEL RELEASE     lt and rt   CHOLECYSTECTOMY  1999   COLONOSCOPY WITH PROPOFOL N/A 03/20/2015   Procedure: COLONOSCOPY WITH PROPOFOL;  Surgeon: Charolett Bumpers, MD;  Location: [facility];  Service: Endoscopy;  Laterality: N/A;   ELECTROPHYSIOLOGIC STUDY N/A 05/30/2015   Procedure: SVT Ablation;  Surgeon: Marinus Maw, MD;  Location: [facility] INVASIVE CV LAB;  Service: Cardiovascular;  Laterality: N/A;   PARTIAL MASTECTOMY WITH NEEDLE LOCALIZATION AND AXILLARY SENTINEL LYMPH NODE BX Left 01/16/2014   Procedure: PARTIAL MASTECTOMY WITH NEEDLE LOCALIZATION AND AXILLARY SENTINEL LYMPH NODE BIOPSY;  Surgeon: Ernestene Mention, MD;  Location: [facility];  Service: General;  Laterality: Left;   PORT-A-CATH REMOVAL Right 08/23/2014   Procedure: MINOR REMOVAL PORT-A-CATH;  Surgeon: Claud Kelp, MD;  Location: [facility];  Service: General;  Laterality: Right;   PORTACATH PLACEMENT Right 02/28/2014    Procedure: INSERTION PORT-A-CATH;  Surgeon: Ernestene Mention, MD;  Location: [facility];  Service: General;  Laterality: Right;   RIGHT/LEFT HEART CATH AND CORONARY ANGIOGRAPHY N/A 01/28/2022   Procedure: RIGHT/LEFT HEART CATH AND CORONARY ANGIOGRAPHY;  Surgeon: Corky Crafts, MD;  Location: [facility] INVASIVE CV LAB;  Service: Cardiovascular;  Laterality: N/A;   Social History   Occupational History   Occupation: Engineer, drilling: [employer]  Tobacco Use   Smoking status: Former    Current packs/day: 0.00    Average packs/day: 1.5 packs/day for 8.0 years (12.0 ttl pk-yrs)    Types: Cigarettes    Start date: 01/22/1968    Quit date: 01/22/1976    Years since quitting: 48.1   Smokeless tobacco: Never  Vaping Use   Vaping status: Never Used  Substance and Sexual Activity   Alcohol use: Yes    Alcohol/week: 7.0 standard drinks of alcohol    Types: 7 Shots of liquor per week    Comment: 1 vodka per day   Drug use: No   Sexual activity: Yes    Birth control/protection: Post-menopausal

## 2024-03-22 DIAGNOSIS — Z1231 Encounter for screening mammogram for malignant neoplasm of breast: Secondary | ICD-10-CM | POA: Diagnosis not present

## 2024-03-23 ENCOUNTER — Encounter (INDEPENDENT_AMBULATORY_CARE_PROVIDER_SITE_OTHER): Payer: Self-pay | Admitting: Internal Medicine

## 2024-03-23 DIAGNOSIS — I2510 Atherosclerotic heart disease of native coronary artery without angina pectoris: Secondary | ICD-10-CM

## 2024-03-25 NOTE — Telephone Encounter (Signed)
 73 yo F with NYHA I oHCM and Non obstructive CAD (asymptomatic, elevated Lpa).  Planned to start Estrogen by [facility]PA but wanted to discussed risk/benefit analysis. - No issues with HCM - The WHI and WISDOM trials demonstrated elevated risks of stroke, thromboembolism, and transient CAD events during the first year of hormone replacement therapy (HRT), particularly with oral conjugated equine estrogens (CEE) and medroxyprogesterone acetate (MPA); though long term follow up showed no different in mortality - reasonable to proceed with estrogen.  Please see the MyChart message reply(ies) for my assessment and plan.    This patient gave consent for this Medical Advice Message and is aware that it may result in a bill to [employer], as well as the possibility of receiving a bill for a co-payment or deductible. They are an established patient, but are not seeking medical advice exclusively about a problem treated during an in person or video visit in the last seven days. I did not recommend an in person or video visit within seven days of my reply.    I spent a total of 10 minutes cumulative time within 7 days through [employer].  Jann Melody, MD

## 2024-03-31 ENCOUNTER — Telehealth: Payer: Self-pay | Admitting: Internal Medicine

## 2024-03-31 DIAGNOSIS — I422 Other hypertrophic cardiomyopathy: Secondary | ICD-10-CM

## 2024-03-31 NOTE — Telephone Encounter (Signed)
  Per MyChart scheduling message:  Patient would like to know when she needs to have another Echo done

## 2024-03-31 NOTE — Telephone Encounter (Signed)
 Left voicemail to return call to office

## 2024-04-04 DIAGNOSIS — I700 Atherosclerosis of aorta: Secondary | ICD-10-CM | POA: Diagnosis not present

## 2024-04-04 DIAGNOSIS — Z0000 Encounter for general adult medical examination without abnormal findings: Secondary | ICD-10-CM | POA: Diagnosis not present

## 2024-04-04 DIAGNOSIS — E782 Mixed hyperlipidemia: Secondary | ICD-10-CM | POA: Diagnosis not present

## 2024-04-04 DIAGNOSIS — G8929 Other chronic pain: Secondary | ICD-10-CM | POA: Diagnosis not present

## 2024-04-04 DIAGNOSIS — M25512 Pain in left shoulder: Secondary | ICD-10-CM | POA: Diagnosis not present

## 2024-04-04 DIAGNOSIS — I10 Essential (primary) hypertension: Secondary | ICD-10-CM | POA: Diagnosis not present

## 2024-04-04 DIAGNOSIS — M25552 Pain in left hip: Secondary | ICD-10-CM | POA: Diagnosis not present

## 2024-04-04 DIAGNOSIS — N952 Postmenopausal atrophic vaginitis: Secondary | ICD-10-CM | POA: Diagnosis not present

## 2024-04-04 DIAGNOSIS — M25551 Pain in right hip: Secondary | ICD-10-CM | POA: Diagnosis not present

## 2024-04-04 DIAGNOSIS — I422 Other hypertrophic cardiomyopathy: Secondary | ICD-10-CM | POA: Diagnosis not present

## 2024-04-05 ENCOUNTER — Other Ambulatory Visit: Payer: Self-pay | Admitting: Physician Assistant

## 2024-04-05 DIAGNOSIS — M5440 Lumbago with sciatica, unspecified side: Secondary | ICD-10-CM

## 2024-04-05 DIAGNOSIS — G8929 Other chronic pain: Secondary | ICD-10-CM

## 2024-04-08 NOTE — Telephone Encounter (Signed)
 Called pt advised of MD response.  Pt expresses understanding. Echo order placed for sept 2025.

## 2024-04-12 DIAGNOSIS — R1013 Epigastric pain: Secondary | ICD-10-CM | POA: Diagnosis not present

## 2024-04-12 DIAGNOSIS — Z8719 Personal history of other diseases of the digestive system: Secondary | ICD-10-CM | POA: Diagnosis not present

## 2024-04-16 ENCOUNTER — Ambulatory Visit
Admission: RE | Admit: 2024-04-16 | Discharge: 2024-04-16 | Disposition: A | Discharge status: Home / Self Care | Source: Ambulatory Visit | Attending: Physician Assistant | Admitting: Physician Assistant

## 2024-04-16 DIAGNOSIS — M5440 Lumbago with sciatica, unspecified side: Secondary | ICD-10-CM

## 2024-04-16 DIAGNOSIS — G8929 Other chronic pain: Secondary | ICD-10-CM

## 2024-04-16 DIAGNOSIS — M5416 Radiculopathy, lumbar region: Secondary | ICD-10-CM | POA: Diagnosis not present

## 2024-04-18 ENCOUNTER — Other Ambulatory Visit: Payer: Self-pay | Admitting: Physician Assistant

## 2024-04-26 DIAGNOSIS — H1045 Other chronic allergic conjunctivitis: Secondary | ICD-10-CM | POA: Diagnosis not present

## 2024-04-26 DIAGNOSIS — H2511 Age-related nuclear cataract, right eye: Secondary | ICD-10-CM | POA: Diagnosis not present

## 2024-05-03 DIAGNOSIS — D509 Iron deficiency anemia, unspecified: Secondary | ICD-10-CM | POA: Diagnosis not present

## 2024-05-03 DIAGNOSIS — E559 Vitamin D deficiency, unspecified: Secondary | ICD-10-CM | POA: Diagnosis not present

## 2024-05-10 DIAGNOSIS — M79671 Pain in right foot: Secondary | ICD-10-CM | POA: Diagnosis not present

## 2024-05-21 ENCOUNTER — Other Ambulatory Visit: Payer: Self-pay | Admitting: Internal Medicine

## 2024-05-21 DIAGNOSIS — R400 Somnolence: Secondary | ICD-10-CM

## 2024-05-21 DIAGNOSIS — I421 Obstructive hypertrophic cardiomyopathy: Secondary | ICD-10-CM

## 2024-06-14 ENCOUNTER — Other Ambulatory Visit: Payer: Self-pay | Admitting: Physician Assistant

## 2024-06-30 ENCOUNTER — Other Ambulatory Visit: Payer: Self-pay | Admitting: Interventional Cardiology

## 2024-07-05 ENCOUNTER — Encounter: Payer: Self-pay | Admitting: Internal Medicine

## 2024-07-19 ENCOUNTER — Other Ambulatory Visit: Payer: Self-pay | Admitting: Internal Medicine

## 2024-08-05 DIAGNOSIS — R238 Other skin changes: Secondary | ICD-10-CM | POA: Diagnosis not present

## 2024-08-05 DIAGNOSIS — L821 Other seborrheic keratosis: Secondary | ICD-10-CM | POA: Diagnosis not present

## 2024-08-05 DIAGNOSIS — D225 Melanocytic nevi of trunk: Secondary | ICD-10-CM | POA: Diagnosis not present

## 2024-08-05 DIAGNOSIS — L719 Rosacea, unspecified: Secondary | ICD-10-CM | POA: Diagnosis not present

## 2024-08-15 ENCOUNTER — Ambulatory Visit (HOSPITAL_COMMUNITY)
Admission: RE | Admit: 2024-08-15 | Discharge: 2024-08-15 | Disposition: A | Discharge status: Home / Self Care | Source: Ambulatory Visit | Attending: Internal Medicine | Admitting: Internal Medicine

## 2024-08-15 DIAGNOSIS — I422 Other hypertrophic cardiomyopathy: Secondary | ICD-10-CM | POA: Diagnosis not present

## 2024-08-15 LAB — ECHOCARDIOGRAM COMPLETE
Area-P 1/2: 2.95 cm2
Est EF: >75
S' Lateral: 2.2 cm

## 2024-08-16 ENCOUNTER — Other Ambulatory Visit: Payer: Self-pay | Admitting: Physician Assistant

## 2024-08-22 ENCOUNTER — Ambulatory Visit: Payer: Self-pay

## 2024-10-10 ENCOUNTER — Encounter: Payer: Self-pay | Admitting: Radiology

## 2024-11-07 ENCOUNTER — Other Ambulatory Visit: Payer: Self-pay | Admitting: Physician Assistant

## 2024-11-07 ENCOUNTER — Other Ambulatory Visit: Payer: Self-pay | Admitting: Internal Medicine

## 2024-11-10 ENCOUNTER — Other Ambulatory Visit: Payer: Self-pay | Admitting: Internal Medicine

## 2024-11-11 MED ORDER — FUROSEMIDE 40 MG PO TABS: 80.0000 mg | ORAL_TABLET | Freq: Every day | ORAL | 0 refills | Status: AC

## 2024-11-15 MED ORDER — FUROSEMIDE 40 MG PO TABS: 80.0000 mg | ORAL_TABLET | Freq: Every day | ORAL | 0 refills | Status: AC

## 2024-11-17 ENCOUNTER — Encounter: Payer: Self-pay | Admitting: Internal Medicine

## 2024-11-17 ENCOUNTER — Other Ambulatory Visit: Payer: Self-pay

## 2024-11-17 DIAGNOSIS — I422 Other hypertrophic cardiomyopathy: Secondary | ICD-10-CM

## 2024-11-17 DIAGNOSIS — R002 Palpitations: Secondary | ICD-10-CM

## 2024-11-17 NOTE — Telephone Encounter (Signed)
 Spoke with pt regarding her heart rate. Pt stated that yesterday she had an episode where her heart rate was 141 for about 30 mins. Pt stated she tried some vagal maneuvers but they did not help. Pt stated she was on her way to [facility] ED when her heart rate slowed down. Pt stated she thinks her metoprolol  kicked in at that time. Pt stated when her heart rate becomes elevated she feels a tightness if her chest but denies any chest pain or shortness of breath. Pt also denied any dizziness or syncope with her elevated heart rate. Pt was given ED precautions and told the Dr. Santo would be notified. Pt verbalized understanding. All questions if any were answered.

## 2024-11-18 ENCOUNTER — Ambulatory Visit: Admitting: Cardiology

## 2024-11-22 ENCOUNTER — Ambulatory Visit: Attending: Internal Medicine

## 2024-11-22 DIAGNOSIS — R002 Palpitations: Secondary | ICD-10-CM

## 2024-11-22 DIAGNOSIS — I422 Other hypertrophic cardiomyopathy: Secondary | ICD-10-CM

## 2024-11-22 NOTE — Progress Notes (Unsigned)
 Enrolled patient for a 14 day Zio AT monitor to be mailed to patients home.

## 2025-01-11 ENCOUNTER — Ambulatory Visit: Payer: Self-pay | Admitting: Internal Medicine

## 2025-01-11 DIAGNOSIS — I422 Other hypertrophic cardiomyopathy: Secondary | ICD-10-CM

## 2025-01-11 DIAGNOSIS — R002 Palpitations: Secondary | ICD-10-CM | POA: Diagnosis not present
